# Patient Record
Sex: Female | Born: 1955
Health system: Southern US, Community
[De-identification: ages and names within clinical notes are randomized; demographics above are authoritative.]

## PROBLEM LIST (undated history)

## (undated) DIAGNOSIS — Z9889 Other specified postprocedural states: Secondary | ICD-10-CM

## (undated) DIAGNOSIS — I10 Essential (primary) hypertension: Secondary | ICD-10-CM

## (undated) DIAGNOSIS — J45909 Unspecified asthma, uncomplicated: Secondary | ICD-10-CM

## (undated) DIAGNOSIS — R112 Nausea with vomiting, unspecified: Secondary | ICD-10-CM

## (undated) HISTORY — PX: DG GALL BLADDER: HXRAD326

## (undated) HISTORY — PX: ABDOMINAL HYSTERECTOMY: SHX81

## (undated) HISTORY — PX: FOOT SURGERY: SHX648

## (undated) HISTORY — PX: BREAST EXCISIONAL BIOPSY: SUR124

---

## 2001-09-22 ENCOUNTER — Encounter: Payer: Self-pay | Admitting: *Deleted

## 2001-09-22 ENCOUNTER — Encounter (INDEPENDENT_AMBULATORY_CARE_PROVIDER_SITE_OTHER): Payer: Self-pay | Admitting: Specialist

## 2001-09-22 ENCOUNTER — Encounter: Admission: RE | Admit: 2001-09-22 | Discharge: 2001-09-22 | Payer: Self-pay | Admitting: *Deleted

## 2002-10-02 ENCOUNTER — Encounter: Admission: RE | Admit: 2002-10-02 | Discharge: 2002-10-02 | Payer: Self-pay | Admitting: Family Medicine

## 2002-10-02 ENCOUNTER — Encounter: Payer: Self-pay | Admitting: Family Medicine

## 2004-10-23 ENCOUNTER — Ambulatory Visit (HOSPITAL_COMMUNITY): Admission: RE | Admit: 2004-10-23 | Discharge: 2004-10-23 | Payer: Self-pay | Admitting: Family Medicine

## 2008-05-17 ENCOUNTER — Encounter: Admission: RE | Admit: 2008-05-17 | Discharge: 2008-05-17 | Payer: Self-pay | Admitting: Family Medicine

## 2009-05-16 ENCOUNTER — Encounter: Admission: RE | Admit: 2009-05-16 | Discharge: 2009-05-16 | Payer: Self-pay | Admitting: Family Medicine

## 2009-05-20 ENCOUNTER — Encounter: Admission: RE | Admit: 2009-05-20 | Discharge: 2009-05-20 | Payer: Self-pay | Admitting: Family Medicine

## 2010-09-06 ENCOUNTER — Other Ambulatory Visit (HOSPITAL_COMMUNITY): Payer: Self-pay | Admitting: *Deleted

## 2010-09-06 ENCOUNTER — Other Ambulatory Visit (HOSPITAL_COMMUNITY): Payer: Self-pay | Admitting: Family Medicine

## 2010-09-06 DIAGNOSIS — Z139 Encounter for screening, unspecified: Secondary | ICD-10-CM

## 2010-09-08 ENCOUNTER — Ambulatory Visit (HOSPITAL_COMMUNITY)
Admission: RE | Admit: 2010-09-08 | Discharge: 2010-09-08 | Disposition: A | Discharge status: Home / Self Care | Payer: BC Managed Care – PPO | Source: Ambulatory Visit | Attending: Family Medicine | Admitting: Family Medicine

## 2010-09-08 DIAGNOSIS — Z1231 Encounter for screening mammogram for malignant neoplasm of breast: Secondary | ICD-10-CM | POA: Insufficient documentation

## 2010-09-08 DIAGNOSIS — Z139 Encounter for screening, unspecified: Secondary | ICD-10-CM

## 2011-09-03 ENCOUNTER — Other Ambulatory Visit (HOSPITAL_COMMUNITY): Payer: Self-pay | Admitting: Family Medicine

## 2011-09-03 DIAGNOSIS — Z139 Encounter for screening, unspecified: Secondary | ICD-10-CM

## 2011-09-10 ENCOUNTER — Ambulatory Visit (HOSPITAL_COMMUNITY)
Admission: RE | Admit: 2011-09-10 | Discharge: 2011-09-10 | Disposition: A | Discharge status: Home / Self Care | Payer: BC Managed Care – PPO | Source: Ambulatory Visit | Attending: Family Medicine | Admitting: Family Medicine

## 2011-09-10 DIAGNOSIS — Z139 Encounter for screening, unspecified: Secondary | ICD-10-CM

## 2011-09-10 DIAGNOSIS — Z1231 Encounter for screening mammogram for malignant neoplasm of breast: Secondary | ICD-10-CM | POA: Insufficient documentation

## 2012-08-13 ENCOUNTER — Other Ambulatory Visit: Payer: Self-pay

## 2012-08-13 ENCOUNTER — Other Ambulatory Visit: Payer: Self-pay | Admitting: Family Medicine

## 2012-08-13 DIAGNOSIS — Z1231 Encounter for screening mammogram for malignant neoplasm of breast: Secondary | ICD-10-CM

## 2012-10-06 ENCOUNTER — Ambulatory Visit
Admission: RE | Admit: 2012-10-06 | Discharge: 2012-10-06 | Disposition: A | Discharge status: Home / Self Care | Payer: BC Managed Care – PPO | Source: Ambulatory Visit | Attending: Family Medicine | Admitting: Family Medicine

## 2012-10-06 DIAGNOSIS — Z1231 Encounter for screening mammogram for malignant neoplasm of breast: Secondary | ICD-10-CM

## 2013-09-01 ENCOUNTER — Other Ambulatory Visit: Payer: Self-pay

## 2013-09-01 DIAGNOSIS — Z1231 Encounter for screening mammogram for malignant neoplasm of breast: Secondary | ICD-10-CM

## 2013-09-29 ENCOUNTER — Other Ambulatory Visit (HOSPITAL_COMMUNITY): Payer: Self-pay | Admitting: Family Medicine

## 2013-09-29 DIAGNOSIS — Z1231 Encounter for screening mammogram for malignant neoplasm of breast: Secondary | ICD-10-CM

## 2013-10-07 ENCOUNTER — Ambulatory Visit: Payer: BC Managed Care – PPO

## 2013-10-13 ENCOUNTER — Ambulatory Visit (HOSPITAL_COMMUNITY)
Admission: RE | Admit: 2013-10-13 | Discharge: 2013-10-13 | Disposition: A | Discharge status: Home / Self Care | Payer: BC Managed Care – PPO | Source: Ambulatory Visit | Attending: Family Medicine | Admitting: Family Medicine

## 2013-10-13 DIAGNOSIS — Z1231 Encounter for screening mammogram for malignant neoplasm of breast: Secondary | ICD-10-CM

## 2014-11-29 ENCOUNTER — Other Ambulatory Visit (HOSPITAL_COMMUNITY): Payer: Self-pay | Admitting: Family Medicine

## 2014-11-29 DIAGNOSIS — Z1231 Encounter for screening mammogram for malignant neoplasm of breast: Secondary | ICD-10-CM

## 2014-12-06 ENCOUNTER — Ambulatory Visit (HOSPITAL_COMMUNITY)
Admission: RE | Admit: 2014-12-06 | Discharge: 2014-12-06 | Disposition: A | Discharge status: Home / Self Care | Payer: BLUE CROSS/BLUE SHIELD | Source: Ambulatory Visit | Attending: Family Medicine | Admitting: Family Medicine

## 2014-12-06 DIAGNOSIS — Z1231 Encounter for screening mammogram for malignant neoplasm of breast: Secondary | ICD-10-CM | POA: Diagnosis present

## 2015-12-12 ENCOUNTER — Other Ambulatory Visit (HOSPITAL_COMMUNITY): Payer: Self-pay | Admitting: Family Medicine

## 2015-12-12 DIAGNOSIS — Z1231 Encounter for screening mammogram for malignant neoplasm of breast: Secondary | ICD-10-CM

## 2015-12-19 ENCOUNTER — Ambulatory Visit (HOSPITAL_COMMUNITY)
Admission: RE | Admit: 2015-12-19 | Discharge: 2015-12-19 | Disposition: A | Discharge status: Home / Self Care | Payer: BLUE CROSS/BLUE SHIELD | Source: Ambulatory Visit | Attending: Family Medicine | Admitting: Family Medicine

## 2015-12-19 DIAGNOSIS — Z1231 Encounter for screening mammogram for malignant neoplasm of breast: Secondary | ICD-10-CM | POA: Insufficient documentation

## 2017-01-23 ENCOUNTER — Other Ambulatory Visit (HOSPITAL_COMMUNITY): Payer: Self-pay | Admitting: Family Medicine

## 2017-01-23 DIAGNOSIS — Z1231 Encounter for screening mammogram for malignant neoplasm of breast: Secondary | ICD-10-CM

## 2017-01-24 ENCOUNTER — Ambulatory Visit (HOSPITAL_COMMUNITY)
Admission: RE | Admit: 2017-01-24 | Discharge: 2017-01-24 | Disposition: A | Discharge status: Home / Self Care | Payer: 59 | Source: Ambulatory Visit | Attending: Family Medicine | Admitting: Family Medicine

## 2017-01-24 DIAGNOSIS — Z1231 Encounter for screening mammogram for malignant neoplasm of breast: Secondary | ICD-10-CM | POA: Insufficient documentation

## 2017-02-11 ENCOUNTER — Telehealth: Payer: Self-pay | Admitting: Cardiovascular Disease

## 2017-02-11 NOTE — Telephone Encounter (Signed)
Received records from [facility] for appointment on 03/06/17 with Dr Gwenlyn Found.  Records put with Dr Kennon Holter schedule for 03/06/17. lp

## 2017-02-28 ENCOUNTER — Encounter: Payer: Self-pay | Admitting: Cardiovascular Disease

## 2017-03-06 ENCOUNTER — Ambulatory Visit (INDEPENDENT_AMBULATORY_CARE_PROVIDER_SITE_OTHER): Payer: 59 | Admitting: Cardiovascular Disease

## 2017-03-06 ENCOUNTER — Encounter: Payer: Self-pay | Admitting: Cardiovascular Disease

## 2017-03-06 ENCOUNTER — Encounter (INDEPENDENT_AMBULATORY_CARE_PROVIDER_SITE_OTHER): Payer: Self-pay

## 2017-03-06 VITALS — BP 172/86 | HR 75 | Ht 63.0 in | Wt 158.2 lb

## 2017-03-06 DIAGNOSIS — I200 Unstable angina: Secondary | ICD-10-CM

## 2017-03-06 DIAGNOSIS — R072 Precordial pain: Secondary | ICD-10-CM

## 2017-03-06 DIAGNOSIS — Z8249 Family history of ischemic heart disease and other diseases of the circulatory system: Secondary | ICD-10-CM | POA: Insufficient documentation

## 2017-03-06 DIAGNOSIS — R079 Chest pain, unspecified: Secondary | ICD-10-CM | POA: Insufficient documentation

## 2017-03-06 NOTE — Patient Instructions (Signed)
Medication Instructions:   NO CHANGE  Testing/Procedures:  Your physician has requested that you have en exercise stress myoview. For further information please visit [email or web address]. Please follow instruction sheet, as given.    Follow-Up:  Your physician recommends that you schedule a follow-up appointment WITH DR Gwenlyn Found AFTER TESTING COMPLETE

## 2017-03-06 NOTE — Assessment & Plan Note (Signed)
Amanda Roberts is referred by Dr. Nadara Mustard for evaluation of chest pain. She is a strong family history of heart disease including both parents and 3 brothers but otherwise no cardiac risk factors. She's never had a heart attack or stroke. She's had chest pain for 5 years occurring on a weekly basis lasting for seconds at a time without radiation. There is associated shortness of breath. She's had a negative GXT done about Hospital several years ago. Because of the recent death of her mother for microinfarction she is more concerned. I'm going to get an exercise Myoview stress test to further evaluate.

## 2017-03-06 NOTE — Progress Notes (Signed)
03/06/2017 Amanda Roberts   1955-11-07  [id number]  Primary Physician Rory Percy, MD Primary Cardiologist: Lorretta Harp MD [place] Car[place], [place]  HPI:  Amanda Roberts is a 61 y.o. female married Caucasian female mother of one child, grandmother of 2 twins referred by Dr. Rory Percy for cardiovascular evaluation because of chest pain. Her sister, Amanda Roberts , is also a patient of mine. She basically has no cardiac risk factors other than a strong family history of heart disease with both parents had myocardial infarctions and 3 brothers one of which recently died of a myocardial infarction. She has never had a heart attack or stroke. She's had chest pain off and on for last 5 years occurring weekly, lasting seconds at a time. There is associated shortness of breath but otherwise no nausea vomiting or diaphoresis. There is no radiation. She did have a GXT in [place] several years ago which was unrevealing.   Current Meds  Medication Sig  . albuterol (PROVENTIL HFA;VENTOLIN HFA) 108 (90 Base) MCG/ACT inhaler Inhale 2 puffs into the lungs as directed.  Marland Kitchen aspirin EC 81 MG tablet Take 1 tablet by mouth once a week.  . cyclobenzaprine (FLEXERIL) 10 MG tablet Take 1 tablet by mouth daily as needed for muscle spasms.  . furosemide (LASIX) 40 MG tablet Take 1 tablet by mouth daily as needed.  . meloxicam (MOBIC) 15 MG tablet Take 1 tablet by mouth daily as needed for pain.  . montelukast (SINGULAIR) 10 MG tablet Take 10 mg by mouth as directed.     Allergies  Allergen Reactions  . Levaquin [Levofloxacin In D5w] Rash  . Sulfa Antibiotics Rash    Social History   Social History  . Marital status: Married    Spouse name: Amanda Roberts  . Number of children: Amanda Roberts  . Years of education: Amanda Roberts   Occupational History  . Not on file.   Social History Main Topics  . Smoking status: Never Smoker  . Smokeless tobacco: Never Used  . Alcohol use Not on file  . Drug use: Unknown  .  Sexual activity: Not on file   Other Topics Concern  . Not on file   Social History Narrative  . No narrative on file     Review of Systems: General: negative for chills, fever, night sweats or weight changes.  Cardiovascular: negative for chest pain, dyspnea on exertion, edema, orthopnea, palpitations, paroxysmal nocturnal dyspnea or shortness of breath Dermatological: negative for rash Respiratory: negative for cough or wheezing Urologic: negative for hematuria Abdominal: negative for nausea, vomiting, diarrhea, bright red blood per rectum, melena, or hematemesis Neurologic: negative for visual changes, syncope, or dizziness All other systems reviewed and are otherwise negative except as noted above.    Blood pressure (!) 172/86, pulse 75, height 5\' 3"  (1.6 m), weight 158 lb 3.2 oz (71.8 kg).  General appearance: alert and no distress Neck: no adenopathy, no carotid bruit, no JVD, supple, symmetrical, trachea midline and thyroid not enlarged, symmetric, no tenderness/mass/nodules Lungs: clear to auscultation bilaterally Heart: regular rate and rhythm, S1, S2 normal, no murmur, click, rub or gallop Extremities: extremities normal, atraumatic, no cyanosis or edema Pulses: 2+ and symmetric Skin: Skin color, texture, turgor normal. No rashes or lesions Neurologic: Alert and oriented X 3, normal strength and tone. Normal symmetric reflexes. Normal coordination and gait  EKG sinus rhythm at 75 without ST or T-wave changes. There was sinus arrhythmia noted. I personally reviewed this EKG.  ASSESSMENT AND PLAN:   Chest pain Ms. Amanda Roberts is referred by Dr. Nadara Roberts for evaluation of chest pain. She is a strong family history of heart disease including both parents and 3 brothers but otherwise no cardiac risk factors. She's never had a heart attack or stroke. She's had chest pain for 5 years occurring on a weekly basis lasting for seconds at a time without radiation. There is associated  shortness of breath. She's had a negative GXT done about Hospital several years ago. Because of the recent death of her mother for microinfarction she is more concerned. I'm going to get an exercise Myoview stress test to further evaluate.      Lorretta Harp MD FACP,FACC,FAHA, [facility] 03/06/2017 10:13 AM

## 2017-03-14 ENCOUNTER — Telehealth (HOSPITAL_COMMUNITY): Payer: Self-pay

## 2017-03-14 NOTE — Telephone Encounter (Signed)
Encounter complete. 

## 2017-03-19 ENCOUNTER — Ambulatory Visit (HOSPITAL_COMMUNITY)
Admission: RE | Admit: 2017-03-19 | Discharge: 2017-03-19 | Disposition: A | Discharge status: Home / Self Care | Payer: 59 | Source: Ambulatory Visit | Attending: Cardiovascular Disease | Admitting: Cardiovascular Disease

## 2017-03-19 DIAGNOSIS — R072 Precordial pain: Secondary | ICD-10-CM | POA: Diagnosis not present

## 2017-03-19 LAB — MYOCARDIAL PERFUSION IMAGING
CHL CUP NUCLEAR SDS: 0
CHL CUP NUCLEAR SRS: 1
CHL CUP NUCLEAR SSS: 1
CSEPPHR: 118 {beats}/min
LV dias vol: 66 mL (ref 46–106)
LV sys vol: 21 mL
Rest HR: 88 {beats}/min
TID: 0.96

## 2017-03-19 MED ORDER — TECHNETIUM TC 99M TETROFOSMIN IV KIT
30.1000 | PACK | Freq: Once | INTRAVENOUS | Status: AC | PRN
  Administered 2017-03-19: 30.1 via INTRAVENOUS
  Filled 2017-03-19: qty 31

## 2017-03-19 MED ORDER — REGADENOSON 0.4 MG/5ML IV SOLN
0.4000 mg | Freq: Once | INTRAVENOUS | Status: AC
  Administered 2017-03-19: 0.4 mg via INTRAVENOUS

## 2017-03-19 MED ORDER — TECHNETIUM TC 99M TETROFOSMIN IV KIT
9.9000 | PACK | Freq: Once | INTRAVENOUS | Status: AC | PRN
  Administered 2017-03-19: 9.9 via INTRAVENOUS
  Filled 2017-03-19: qty 10

## 2017-03-19 MED ORDER — AMINOPHYLLINE 25 MG/ML IV SOLN
75.0000 mg | Freq: Once | INTRAVENOUS | Status: AC
  Administered 2017-03-19: 75 mg via INTRAVENOUS

## 2017-03-21 ENCOUNTER — Telehealth: Payer: Self-pay | Admitting: Cardiovascular Disease

## 2017-03-21 NOTE — Telephone Encounter (Signed)
Spoke with pt, aware we have the readings and they will be shown to dr berry tomorrow and we should call her back.

## 2017-03-21 NOTE — Telephone Encounter (Signed)
New message   Pt states she dropped off a list of her BP the other day. She said she has not heard anything. She said she has a meeting at [phone], she will not be available. If she can be called before then.  Pt c/o BP issue: STAT if pt c/o blurred vision, one-sided weakness or slurred speech  1. What are your last 5 BP readings? This morning-170/98  2. Are you having any other symptoms (ex. Dizziness, headache, blurred vision, passed out)? Headache, being hot  3. What is your BP issue? Pt states her BP is running high.

## 2017-03-25 NOTE — Telephone Encounter (Signed)
Dr. Gwenlyn Found reviewed BP readings and said they were a little high. Will call pt to discuss this.

## 2017-04-05 ENCOUNTER — Encounter: Payer: Self-pay | Admitting: Cardiovascular Disease

## 2017-04-05 ENCOUNTER — Ambulatory Visit (INDEPENDENT_AMBULATORY_CARE_PROVIDER_SITE_OTHER): Payer: 59 | Admitting: Cardiovascular Disease

## 2017-04-05 DIAGNOSIS — I208 Other forms of angina pectoris: Secondary | ICD-10-CM

## 2017-04-05 DIAGNOSIS — I10 Essential (primary) hypertension: Secondary | ICD-10-CM

## 2017-04-05 NOTE — Patient Instructions (Signed)

## 2017-04-05 NOTE — Progress Notes (Signed)
Amanda Roberts returns today for follow-up of her Myoview stress test performed 03/19/17 which was entirely normal. This was done in evaluation of chest pain. I have reassured her despite her strong family history. Her PCP in addition added Toprol for hypertension which has made improvement in her symptoms of palpitations at night and her blood pressure readings. I will see her back in 6 months or follow-up.  Amanda Roberts, M.D., [place], [facility], [place] [address]. [place], [place]  [address]  [phone] 04/05/2017 3:02 PM

## 2017-04-05 NOTE — Assessment & Plan Note (Signed)
History of essential hypertension with blood pressure measured at 141/82 improved since the addition of Toprol by her PCP.

## 2017-04-05 NOTE — Assessment & Plan Note (Signed)
Ms. Piechowski had a Myoview stress test done in the evaluation of chest pain on 03/19/17 which was entirely normal.

## 2018-02-25 ENCOUNTER — Other Ambulatory Visit (HOSPITAL_COMMUNITY): Payer: Self-pay | Admitting: Physician Assistant

## 2018-02-25 DIAGNOSIS — Z1231 Encounter for screening mammogram for malignant neoplasm of breast: Secondary | ICD-10-CM

## 2018-03-03 ENCOUNTER — Encounter (HOSPITAL_COMMUNITY): Payer: Self-pay

## 2018-03-03 ENCOUNTER — Ambulatory Visit (HOSPITAL_COMMUNITY)
Admission: RE | Admit: 2018-03-03 | Discharge: 2018-03-03 | Disposition: A | Discharge status: Home / Self Care | Payer: 59 | Source: Ambulatory Visit | Attending: Physician Assistant | Admitting: Physician Assistant

## 2018-03-03 DIAGNOSIS — Z1231 Encounter for screening mammogram for malignant neoplasm of breast: Secondary | ICD-10-CM | POA: Insufficient documentation

## 2018-12-01 DIAGNOSIS — Z6829 Body mass index (BMI) 29.0-29.9, adult: Secondary | ICD-10-CM | POA: Diagnosis not present

## 2018-12-01 DIAGNOSIS — L209 Atopic dermatitis, unspecified: Secondary | ICD-10-CM | POA: Diagnosis not present

## 2018-12-12 DIAGNOSIS — Z0000 Encounter for general adult medical examination without abnormal findings: Secondary | ICD-10-CM | POA: Diagnosis not present

## 2018-12-16 DIAGNOSIS — Z23 Encounter for immunization: Secondary | ICD-10-CM | POA: Diagnosis not present

## 2018-12-16 DIAGNOSIS — Z0000 Encounter for general adult medical examination without abnormal findings: Secondary | ICD-10-CM | POA: Diagnosis not present

## 2018-12-25 DIAGNOSIS — R0600 Dyspnea, unspecified: Secondary | ICD-10-CM | POA: Diagnosis not present

## 2018-12-25 DIAGNOSIS — I10 Essential (primary) hypertension: Secondary | ICD-10-CM | POA: Diagnosis not present

## 2019-01-08 DIAGNOSIS — Z6829 Body mass index (BMI) 29.0-29.9, adult: Secondary | ICD-10-CM | POA: Diagnosis not present

## 2019-01-08 DIAGNOSIS — I10 Essential (primary) hypertension: Secondary | ICD-10-CM | POA: Diagnosis not present

## 2019-01-08 DIAGNOSIS — J4520 Mild intermittent asthma, uncomplicated: Secondary | ICD-10-CM | POA: Diagnosis not present

## 2019-02-25 DIAGNOSIS — Z23 Encounter for immunization: Secondary | ICD-10-CM | POA: Diagnosis not present

## 2019-06-27 DIAGNOSIS — L259 Unspecified contact dermatitis, unspecified cause: Secondary | ICD-10-CM | POA: Diagnosis not present

## 2019-07-14 ENCOUNTER — Other Ambulatory Visit (HOSPITAL_COMMUNITY): Payer: Self-pay | Admitting: Physician Assistant

## 2019-07-14 DIAGNOSIS — Z1231 Encounter for screening mammogram for malignant neoplasm of breast: Secondary | ICD-10-CM

## 2019-07-16 ENCOUNTER — Ambulatory Visit (HOSPITAL_COMMUNITY): Payer: BC Managed Care – PPO

## 2019-08-14 ENCOUNTER — Other Ambulatory Visit: Payer: Self-pay

## 2019-08-14 ENCOUNTER — Ambulatory Visit (HOSPITAL_COMMUNITY)
Admission: RE | Admit: 2019-08-14 | Discharge: 2019-08-14 | Disposition: A | Discharge status: Home / Self Care | Payer: BC Managed Care – PPO | Source: Ambulatory Visit | Attending: Physician Assistant | Admitting: Physician Assistant

## 2019-08-14 DIAGNOSIS — Z1231 Encounter for screening mammogram for malignant neoplasm of breast: Secondary | ICD-10-CM | POA: Insufficient documentation

## 2019-08-17 ENCOUNTER — Other Ambulatory Visit (HOSPITAL_COMMUNITY): Payer: Self-pay | Admitting: Physician Assistant

## 2019-08-17 DIAGNOSIS — R928 Other abnormal and inconclusive findings on diagnostic imaging of breast: Secondary | ICD-10-CM

## 2019-08-25 ENCOUNTER — Other Ambulatory Visit (HOSPITAL_COMMUNITY): Payer: Self-pay | Admitting: Physician Assistant

## 2019-08-25 ENCOUNTER — Other Ambulatory Visit: Payer: Self-pay

## 2019-08-25 ENCOUNTER — Ambulatory Visit (HOSPITAL_COMMUNITY)
Admission: RE | Admit: 2019-08-25 | Discharge: 2019-08-25 | Disposition: A | Discharge status: Home / Self Care | Payer: BC Managed Care – PPO | Source: Ambulatory Visit | Attending: Physician Assistant | Admitting: Physician Assistant

## 2019-08-25 DIAGNOSIS — N6323 Unspecified lump in the left breast, lower outer quadrant: Secondary | ICD-10-CM | POA: Diagnosis not present

## 2019-08-25 DIAGNOSIS — R928 Other abnormal and inconclusive findings on diagnostic imaging of breast: Secondary | ICD-10-CM

## 2019-08-25 DIAGNOSIS — N6324 Unspecified lump in the left breast, lower inner quadrant: Secondary | ICD-10-CM | POA: Diagnosis not present

## 2019-09-01 ENCOUNTER — Ambulatory Visit (HOSPITAL_COMMUNITY)
Admission: RE | Admit: 2019-09-01 | Discharge: 2019-09-01 | Disposition: A | Discharge status: Home / Self Care | Payer: BC Managed Care – PPO | Source: Ambulatory Visit | Attending: Physician Assistant | Admitting: Physician Assistant

## 2019-09-01 ENCOUNTER — Other Ambulatory Visit: Payer: Self-pay

## 2019-09-01 ENCOUNTER — Other Ambulatory Visit (HOSPITAL_COMMUNITY): Payer: Self-pay | Admitting: Physician Assistant

## 2019-09-01 DIAGNOSIS — N6012 Diffuse cystic mastopathy of left breast: Secondary | ICD-10-CM | POA: Diagnosis not present

## 2019-09-01 DIAGNOSIS — R928 Other abnormal and inconclusive findings on diagnostic imaging of breast: Secondary | ICD-10-CM

## 2019-09-01 DIAGNOSIS — N6325 Unspecified lump in the left breast, overlapping quadrants: Secondary | ICD-10-CM | POA: Diagnosis not present

## 2019-09-01 DIAGNOSIS — N6320 Unspecified lump in the left breast, unspecified quadrant: Secondary | ICD-10-CM | POA: Diagnosis not present

## 2019-09-01 MED ORDER — LIDOCAINE HCL (PF) 2 % IJ SOLN
INTRAMUSCULAR | Status: AC
  Filled 2019-09-01: qty 10

## 2019-09-02 ENCOUNTER — Other Ambulatory Visit: Payer: Self-pay | Admitting: Physician Assistant

## 2019-09-02 DIAGNOSIS — N6320 Unspecified lump in the left breast, unspecified quadrant: Secondary | ICD-10-CM

## 2019-09-02 LAB — SURGICAL PATHOLOGY

## 2019-09-08 ENCOUNTER — Other Ambulatory Visit: Payer: Self-pay

## 2019-09-08 ENCOUNTER — Ambulatory Visit
Admission: RE | Admit: 2019-09-08 | Discharge: 2019-09-08 | Disposition: A | Discharge status: Home / Self Care | Payer: BC Managed Care – PPO | Source: Ambulatory Visit | Attending: Physician Assistant | Admitting: Physician Assistant

## 2019-09-08 DIAGNOSIS — N6323 Unspecified lump in the left breast, lower outer quadrant: Secondary | ICD-10-CM | POA: Diagnosis not present

## 2019-09-08 DIAGNOSIS — N6320 Unspecified lump in the left breast, unspecified quadrant: Secondary | ICD-10-CM | POA: Diagnosis not present

## 2019-09-08 DIAGNOSIS — D242 Benign neoplasm of left breast: Secondary | ICD-10-CM | POA: Diagnosis not present

## 2019-10-01 ENCOUNTER — Ambulatory Visit: Payer: Self-pay | Admitting: General Surgery

## 2019-10-01 DIAGNOSIS — D242 Benign neoplasm of left breast: Secondary | ICD-10-CM | POA: Diagnosis not present

## 2019-10-14 ENCOUNTER — Other Ambulatory Visit: Payer: Self-pay | Admitting: General Surgery

## 2019-10-14 DIAGNOSIS — D242 Benign neoplasm of left breast: Secondary | ICD-10-CM

## 2019-10-15 ENCOUNTER — Encounter (HOSPITAL_BASED_OUTPATIENT_CLINIC_OR_DEPARTMENT_OTHER): Payer: Self-pay | Admitting: General Surgery

## 2019-10-15 ENCOUNTER — Other Ambulatory Visit: Payer: Self-pay

## 2019-10-20 ENCOUNTER — Encounter (HOSPITAL_BASED_OUTPATIENT_CLINIC_OR_DEPARTMENT_OTHER)
Admission: RE | Admit: 2019-10-20 | Discharge: 2019-10-20 | Disposition: A | Discharge status: Home / Self Care | Payer: BC Managed Care – PPO | Source: Ambulatory Visit | Attending: General Surgery | Admitting: General Surgery

## 2019-10-20 ENCOUNTER — Other Ambulatory Visit (HOSPITAL_COMMUNITY)
Admission: RE | Admit: 2019-10-20 | Discharge: 2019-10-20 | Disposition: A | Discharge status: Home / Self Care | Payer: BC Managed Care – PPO | Source: Ambulatory Visit | Attending: General Surgery | Admitting: General Surgery

## 2019-10-20 DIAGNOSIS — Z20822 Contact with and (suspected) exposure to covid-19: Secondary | ICD-10-CM | POA: Insufficient documentation

## 2019-10-20 DIAGNOSIS — Z01818 Encounter for other preprocedural examination: Secondary | ICD-10-CM | POA: Insufficient documentation

## 2019-10-20 LAB — BASIC METABOLIC PANEL
Anion gap: 8 (ref 5–15)
BUN: 12 mg/dL (ref 8–23)
CO2: 27 mmol/L (ref 22–32)
Calcium: 9.2 mg/dL (ref 8.9–10.3)
Chloride: 105 mmol/L (ref 98–111)
Creatinine, Ser: 0.76 mg/dL (ref 0.44–1.00)
GFR calc Af Amer: >60 mL/min (ref >60)
GFR calc non Af Amer: >60 mL/min (ref >60)
Glucose, Bld: 102 mg/dL — ABNORMAL HIGH (ref 70–99)
Potassium: 4.2 mmol/L (ref 3.5–5.1)
Sodium: 140 mmol/L (ref 135–145)

## 2019-10-20 LAB — SARS CORONAVIRUS 2 (TAT 6-24 HRS): SARS Coronavirus 2: NEGATIVE

## 2019-10-20 NOTE — Progress Notes (Signed)

## 2019-10-22 ENCOUNTER — Other Ambulatory Visit: Payer: Self-pay

## 2019-10-22 ENCOUNTER — Ambulatory Visit
Admission: RE | Admit: 2019-10-22 | Discharge: 2019-10-22 | Disposition: A | Discharge status: Home / Self Care | Payer: BC Managed Care – PPO | Source: Ambulatory Visit | Attending: General Surgery | Admitting: General Surgery

## 2019-10-22 DIAGNOSIS — D242 Benign neoplasm of left breast: Secondary | ICD-10-CM

## 2019-10-22 DIAGNOSIS — R928 Other abnormal and inconclusive findings on diagnostic imaging of breast: Secondary | ICD-10-CM | POA: Diagnosis not present

## 2019-10-23 ENCOUNTER — Encounter (HOSPITAL_BASED_OUTPATIENT_CLINIC_OR_DEPARTMENT_OTHER)
Admission: RE | Disposition: A | Discharge status: Home / Self Care | Payer: Self-pay | Source: Home / Self Care | Attending: General Surgery

## 2019-10-23 ENCOUNTER — Ambulatory Visit (HOSPITAL_BASED_OUTPATIENT_CLINIC_OR_DEPARTMENT_OTHER)
Admission: RE | Admit: 2019-10-23 | Discharge: 2019-10-23 | Disposition: A | Discharge status: Home / Self Care | Payer: BC Managed Care – PPO | Attending: General Surgery | Admitting: General Surgery

## 2019-10-23 ENCOUNTER — Ambulatory Visit (HOSPITAL_BASED_OUTPATIENT_CLINIC_OR_DEPARTMENT_OTHER): Payer: BC Managed Care – PPO | Admitting: Certified Registered Nurse Anesthetist

## 2019-10-23 ENCOUNTER — Ambulatory Visit
Admission: RE | Admit: 2019-10-23 | Discharge: 2019-10-23 | Disposition: A | Discharge status: Home / Self Care | Payer: BC Managed Care – PPO | Source: Ambulatory Visit | Attending: General Surgery | Admitting: General Surgery

## 2019-10-23 ENCOUNTER — Other Ambulatory Visit: Payer: Self-pay

## 2019-10-23 ENCOUNTER — Encounter (HOSPITAL_BASED_OUTPATIENT_CLINIC_OR_DEPARTMENT_OTHER): Payer: Self-pay | Admitting: General Surgery

## 2019-10-23 ENCOUNTER — Other Ambulatory Visit: Payer: Self-pay | Admitting: Anatomic Pathology & Clinical Pathology

## 2019-10-23 DIAGNOSIS — D242 Benign neoplasm of left breast: Secondary | ICD-10-CM

## 2019-10-23 DIAGNOSIS — I10 Essential (primary) hypertension: Secondary | ICD-10-CM | POA: Diagnosis not present

## 2019-10-23 DIAGNOSIS — Z791 Long term (current) use of non-steroidal anti-inflammatories (NSAID): Secondary | ICD-10-CM | POA: Diagnosis not present

## 2019-10-23 DIAGNOSIS — J45909 Unspecified asthma, uncomplicated: Secondary | ICD-10-CM | POA: Diagnosis not present

## 2019-10-23 DIAGNOSIS — Z79899 Other long term (current) drug therapy: Secondary | ICD-10-CM | POA: Diagnosis not present

## 2019-10-23 DIAGNOSIS — M1990 Unspecified osteoarthritis, unspecified site: Secondary | ICD-10-CM | POA: Insufficient documentation

## 2019-10-23 DIAGNOSIS — R928 Other abnormal and inconclusive findings on diagnostic imaging of breast: Secondary | ICD-10-CM | POA: Diagnosis not present

## 2019-10-23 DIAGNOSIS — N6092 Unspecified benign mammary dysplasia of left breast: Secondary | ICD-10-CM | POA: Diagnosis not present

## 2019-10-23 HISTORY — DX: Other specified postprocedural states: Z98.890

## 2019-10-23 HISTORY — PX: BREAST LUMPECTOMY WITH RADIOACTIVE SEED LOCALIZATION: SHX6424

## 2019-10-23 HISTORY — DX: Nausea with vomiting, unspecified: R11.2

## 2019-10-23 HISTORY — DX: Essential (primary) hypertension: I10

## 2019-10-23 HISTORY — DX: Unspecified asthma, uncomplicated: J45.909

## 2019-10-23 SURGERY — BREAST LUMPECTOMY WITH RADIOACTIVE SEED LOCALIZATION
Anesthesia: General | Site: Breast | Laterality: Left

## 2019-10-23 MED ORDER — GABAPENTIN 300 MG PO CAPS
ORAL_CAPSULE | ORAL | Status: AC
  Filled 2019-10-23: qty 1

## 2019-10-23 MED ORDER — PROMETHAZINE HCL 25 MG/ML IJ SOLN
6.2500 mg | INTRAMUSCULAR | Status: DC | PRN
  Administered 2019-10-23: 6.25 mg via INTRAVENOUS

## 2019-10-23 MED ORDER — ACETAMINOPHEN 500 MG PO TABS
ORAL_TABLET | ORAL | Status: AC
  Filled 2019-10-23: qty 2

## 2019-10-23 MED ORDER — CHLORHEXIDINE GLUCONATE CLOTH 2 % EX PADS: 6.0000 | MEDICATED_PAD | Freq: Once | CUTANEOUS | Status: DC

## 2019-10-23 MED ORDER — DEXAMETHASONE SODIUM PHOSPHATE 10 MG/ML IJ SOLN
INTRAMUSCULAR | Status: DC | PRN
  Administered 2019-10-23: 10 mg via INTRAVENOUS

## 2019-10-23 MED ORDER — EPHEDRINE SULFATE 50 MG/ML IJ SOLN
INTRAMUSCULAR | Status: DC | PRN
  Administered 2019-10-23 (×5): 10 mg via INTRAVENOUS

## 2019-10-23 MED ORDER — LIDOCAINE 2% (20 MG/ML) 5 ML SYRINGE
INTRAMUSCULAR | Status: DC | PRN
  Administered 2019-10-23: 100 mg via INTRAVENOUS

## 2019-10-23 MED ORDER — BUPIVACAINE HCL (PF) 0.25 % IJ SOLN
INTRAMUSCULAR | Status: AC
  Filled 2019-10-23: qty 30

## 2019-10-23 MED ORDER — MIDAZOLAM HCL 2 MG/2ML IJ SOLN
INTRAMUSCULAR | Status: AC
  Filled 2019-10-23: qty 2

## 2019-10-23 MED ORDER — MIDAZOLAM HCL 2 MG/2ML IJ SOLN: 1.0000 mg | INTRAMUSCULAR | Status: DC | PRN

## 2019-10-23 MED ORDER — PHENYLEPHRINE 40 MCG/ML (10ML) SYRINGE FOR IV PUSH (FOR BLOOD PRESSURE SUPPORT)
PREFILLED_SYRINGE | INTRAVENOUS | Status: AC
  Filled 2019-10-23: qty 10

## 2019-10-23 MED ORDER — OXYCODONE HCL 5 MG PO TABS
ORAL_TABLET | ORAL | Status: AC
  Filled 2019-10-23: qty 1

## 2019-10-23 MED ORDER — EPHEDRINE 5 MG/ML INJ
INTRAVENOUS | Status: AC
  Filled 2019-10-23: qty 10

## 2019-10-23 MED ORDER — FENTANYL CITRATE (PF) 100 MCG/2ML IJ SOLN
INTRAMUSCULAR | Status: AC
  Filled 2019-10-23: qty 2

## 2019-10-23 MED ORDER — PROMETHAZINE HCL 25 MG/ML IJ SOLN
INTRAMUSCULAR | Status: AC
  Filled 2019-10-23: qty 1

## 2019-10-23 MED ORDER — BUPIVACAINE HCL (PF) 0.25 % IJ SOLN
INTRAMUSCULAR | Status: DC | PRN
  Administered 2019-10-23: 20 mL

## 2019-10-23 MED ORDER — ONDANSETRON HCL 4 MG/2ML IJ SOLN
INTRAMUSCULAR | Status: AC
  Filled 2019-10-23: qty 2

## 2019-10-23 MED ORDER — PROPOFOL 10 MG/ML IV BOLUS
INTRAVENOUS | Status: DC | PRN
  Administered 2019-10-23: 150 mg via INTRAVENOUS

## 2019-10-23 MED ORDER — LIDOCAINE 2% (20 MG/ML) 5 ML SYRINGE
INTRAMUSCULAR | Status: AC
  Filled 2019-10-23: qty 5

## 2019-10-23 MED ORDER — CEFAZOLIN SODIUM-DEXTROSE 2-4 GM/100ML-% IV SOLN
2.0000 g | INTRAVENOUS | Status: AC
  Administered 2019-10-23: 2 g via INTRAVENOUS

## 2019-10-23 MED ORDER — MIDAZOLAM HCL 5 MG/5ML IJ SOLN
INTRAMUSCULAR | Status: DC | PRN
  Administered 2019-10-23: 2 mg via INTRAVENOUS

## 2019-10-23 MED ORDER — GABAPENTIN 300 MG PO CAPS
300.0000 mg | ORAL_CAPSULE | ORAL | Status: AC
  Administered 2019-10-23: 11:00:00 300 mg via ORAL

## 2019-10-23 MED ORDER — CEFAZOLIN SODIUM-DEXTROSE 2-4 GM/100ML-% IV SOLN
INTRAVENOUS | Status: AC
  Filled 2019-10-23: qty 100

## 2019-10-23 MED ORDER — FENTANYL CITRATE (PF) 100 MCG/2ML IJ SOLN
INTRAMUSCULAR | Status: DC | PRN
  Administered 2019-10-23: 25 ug via INTRAVENOUS
  Administered 2019-10-23: 50 ug via INTRAVENOUS
  Administered 2019-10-23: 25 ug via INTRAVENOUS

## 2019-10-23 MED ORDER — FENTANYL CITRATE (PF) 100 MCG/2ML IJ SOLN
25.0000 ug | INTRAMUSCULAR | Status: DC | PRN
  Administered 2019-10-23 (×2): 50 ug via INTRAVENOUS

## 2019-10-23 MED ORDER — SCOPOLAMINE 1 MG/3DAYS TD PT72
MEDICATED_PATCH | TRANSDERMAL | Status: DC | PRN
  Administered 2019-10-23: 1 via TRANSDERMAL

## 2019-10-23 MED ORDER — LACTATED RINGERS IV SOLN: INTRAVENOUS | Status: DC

## 2019-10-23 MED ORDER — ONDANSETRON HCL 4 MG/2ML IJ SOLN
INTRAMUSCULAR | Status: DC | PRN
  Administered 2019-10-23: 4 mg via INTRAVENOUS

## 2019-10-23 MED ORDER — OXYCODONE HCL 5 MG PO TABS
5.0000 mg | ORAL_TABLET | Freq: Once | ORAL | Status: AC | PRN
  Administered 2019-10-23: 14:00:00 5 mg via ORAL

## 2019-10-23 MED ORDER — DEXAMETHASONE SODIUM PHOSPHATE 10 MG/ML IJ SOLN
INTRAMUSCULAR | Status: AC
  Filled 2019-10-23: qty 1

## 2019-10-23 MED ORDER — PROPOFOL 10 MG/ML IV BOLUS
INTRAVENOUS | Status: AC
  Filled 2019-10-23: qty 20

## 2019-10-23 MED ORDER — 0.9 % SODIUM CHLORIDE (POUR BTL) OPTIME
TOPICAL | Status: DC | PRN
  Administered 2019-10-23: 12:00:00 200 mL

## 2019-10-23 MED ORDER — ACETAMINOPHEN 500 MG PO TABS
1000.0000 mg | ORAL_TABLET | ORAL | Status: AC
  Administered 2019-10-23: 11:00:00 1000 mg via ORAL

## 2019-10-23 MED ORDER — OXYCODONE HCL 5 MG/5ML PO SOLN: 5.0000 mg | Freq: Once | ORAL | Status: AC | PRN

## 2019-10-23 MED ORDER — HYDROCODONE-ACETAMINOPHEN 5-325 MG PO TABS: 1.0000 | ORAL_TABLET | Freq: Four times a day (QID) | ORAL | 0 refills | Status: DC | PRN

## 2019-10-23 MED ORDER — FENTANYL CITRATE (PF) 100 MCG/2ML IJ SOLN: 50.0000 ug | INTRAMUSCULAR | Status: DC | PRN

## 2019-10-23 SURGICAL SUPPLY — 40 items
APPLIER CLIP 9.375 MED OPEN (MISCELLANEOUS)
BLADE SURG 15 STRL LF DISP TIS (BLADE) ×1 IMPLANT
BLADE SURG 15 STRL SS (BLADE) ×2
CANISTER SUC SOCK COL 7IN (MISCELLANEOUS) ×1 IMPLANT
CANISTER SUCT 1200ML W/VALVE (MISCELLANEOUS) ×1 IMPLANT
CHLORAPREP W/TINT 26 (MISCELLANEOUS) ×2 IMPLANT
CLIP APPLIE 9.375 MED OPEN (MISCELLANEOUS) IMPLANT
COVER BACK TABLE 60X90IN (DRAPES) ×2 IMPLANT
COVER MAYO STAND STRL (DRAPES) ×2 IMPLANT
COVER PROBE W GEL 5X96 (DRAPES) ×2 IMPLANT
COVER WAND RF STERILE (DRAPES) IMPLANT
DECANTER SPIKE VIAL GLASS SM (MISCELLANEOUS) IMPLANT
DERMABOND ADVANCED (GAUZE/BANDAGES/DRESSINGS) ×1
DERMABOND ADVANCED .7 DNX12 (GAUZE/BANDAGES/DRESSINGS) ×1 IMPLANT
DRAPE LAPAROSCOPIC ABDOMINAL (DRAPES) ×2 IMPLANT
DRAPE UTILITY XL STRL (DRAPES) ×2 IMPLANT
ELECT COATED BLADE 2.86 ST (ELECTRODE) ×2 IMPLANT
ELECT REM PT RETURN 9FT ADLT (ELECTROSURGICAL) ×2
ELECTRODE REM PT RTRN 9FT ADLT (ELECTROSURGICAL) ×1 IMPLANT
GLOVE BIO SURGEON STRL SZ7.5 (GLOVE) ×4 IMPLANT
GOWN STRL REUS W/ TWL LRG LVL3 (GOWN DISPOSABLE) ×2 IMPLANT
GOWN STRL REUS W/TWL LRG LVL3 (GOWN DISPOSABLE) ×4
ILLUMINATOR WAVEGUIDE N/F (MISCELLANEOUS) IMPLANT
KIT MARKER MARGIN INK (KITS) ×2 IMPLANT
LIGHT WAVEGUIDE WIDE FLAT (MISCELLANEOUS) IMPLANT
NDL HYPO 25X1 1.5 SAFETY (NEEDLE) IMPLANT
NEEDLE HYPO 25X1 1.5 SAFETY (NEEDLE) ×2 IMPLANT
NS IRRIG 1000ML POUR BTL (IV SOLUTION) ×1 IMPLANT
PENCIL SMOKE EVACUATOR (MISCELLANEOUS) ×2 IMPLANT
SET BASIN DAY SURGERY F.S. (CUSTOM PROCEDURE TRAY) ×2 IMPLANT
SLEEVE SCD COMPRESS KNEE MED (MISCELLANEOUS) ×2 IMPLANT
SPONGE LAP 18X18 RF (DISPOSABLE) ×2 IMPLANT
SUT MON AB 4-0 PC3 18 (SUTURE) ×2 IMPLANT
SUT SILK 2 0 SH (SUTURE) IMPLANT
SUT VICRYL 3-0 CR8 SH (SUTURE) ×2 IMPLANT
SYR CONTROL 10ML LL (SYRINGE) ×1 IMPLANT
TOWEL GREEN STERILE FF (TOWEL DISPOSABLE) ×2 IMPLANT
TRAY FAXITRON CT DISP (TRAY / TRAY PROCEDURE) ×2 IMPLANT
TUBE CONNECTING 20X1/4 (TUBING) ×2 IMPLANT
YANKAUER SUCT BULB TIP NO VENT (SUCTIONS) ×1 IMPLANT

## 2019-10-23 NOTE — H&P (Signed)
Amanda Roberts  Location: [facility] Patient #: [id number] DOB: 1956/06/14 Married / Language: English / Race: White Female   History of Present Illness The patient is a 64 year old female who presents with a breast mass. We are asked to see the patient in consultation by Dr. Aggie Hacker to evaluate her for a intraductal papilloma of the left breast. The patient is a 64 year old white female who recently went for a routine screening mammogram. At that time she was found to have a mass in the lower central left breast which measured 9 mm. This was biopsied and came back as an intraductal papilloma. She denies any breast pain or discharge from the nipple. She does not smoke. She does have a family history of breast cancer in her grandmother   Past Surgical History Breast Biopsy  Left. Cesarean Section - 1  Foot Surgery  Left. Gallbladder Surgery - Laparoscopic  Hysterectomy (due to cancer) - Complete  Hysterectomy (not due to cancer) - Complete  Oral Surgery   Diagnostic Studies History  Colonoscopy  5-10 years ago Mammogram  >3 years ago  Allergies  Septra *ANTI-INFECTIVE AGENTS - MISC.*  Levaquin *FLUOROQUINOLONES*  Allergies Reconciled   Medication History  Albuterol Sulfate ((2.5 MG/3ML)0.083% Nebulized Soln, Inhalation) Active. hydroCHLOROthiazide (25MG  Tablet, Oral) Active. Meloxicam (15MG  Tablet, Oral) Active. Singulair (10MG  Tablet, Oral) Active. Toprol XL (50MG  Tablet ER 24HR, Oral) Active. Medications Reconciled  Social History Caffeine use  Coffee. No alcohol use  No drug use  Tobacco use  Never smoker.  Family History  Arthritis  Mother. Breast Cancer  Family Members In General. Diabetes Mellitus  Father, Mother. Heart Disease  Brother, Father, Mother. Heart disease in female family member before age 40  Heart disease in female family member before age 40  Hypertension  Father, Mother. Respiratory Condition   Father, Mother.  Pregnancy / Birth History  Age at menarche  24 years. Age of menopause  <47 Gravida  1 Maternal age  31-25 Para  71  Other Problems Arthritis  Asthma  High blood pressure  Oophorectomy     Review of Systems HEENT Present- Seasonal Allergies and Wears glasses/contact lenses. Not Present- Earache, Hearing Loss, Hoarseness, Nose Bleed, Oral Ulcers, Ringing in the Ears, Sinus Pain, Sore Throat, Visual Disturbances and Yellow Eyes. Cardiovascular Not Present- Chest Pain, Difficulty Breathing Lying Down, Leg Cramps, Palpitations, Rapid Heart Rate, Shortness of Breath and Swelling of Extremities. Gastrointestinal Not Present- Abdominal Pain, Bloating, Bloody Stool, Change in Bowel Habits, Chronic diarrhea, Constipation, Difficulty Swallowing, Excessive gas, Gets full quickly at meals, Hemorrhoids, Indigestion, Nausea, Rectal Pain and Vomiting. Female Genitourinary Not Present- Frequency, Nocturia, Painful Urination, Pelvic Pain and Urgency. Musculoskeletal Not Present- Back Pain, Joint Pain, Joint Stiffness, Muscle Pain, Muscle Weakness and Swelling of Extremities. Neurological Not Present- Decreased Memory, Fainting, Headaches, Numbness, Seizures, Tingling, Tremor, Trouble walking and Weakness. Psychiatric Not Present- Anxiety, Bipolar, Change in Sleep Pattern, Depression, Fearful and Frequent crying. Endocrine Not Present- Cold Intolerance, Excessive Hunger, Hair Changes, Heat Intolerance, Hot flashes and New Diabetes. Hematology Not Present- Blood Thinners, Easy Bruising, Excessive bleeding, Gland problems, HIV and Persistent Infections.  Vitals  Weight: 169.8 lb Height: 63in Body Surface Area: 1.8 m Body Mass Index: 30.08 kg/m  Temp.: 97.51F  Pulse: 78 (Regular)  BP: 136/88(Sitting, Left Arm, Standard)       Physical Exam  General Mental Status-Alert. General Appearance-Consistent with stated age. Hydration-Well  hydrated. Voice-Normal.  Head and Neck Head-normocephalic, atraumatic with no lesions or  palpable masses. Trachea-midline. Thyroid Gland Characteristics - normal size and consistency.  Eye Eyeball - Bilateral-Extraocular movements intact. Sclera/Conjunctiva - Bilateral-No scleral icterus.  Chest and Lung Exam Chest and lung exam reveals -quiet, even and easy respiratory effort with no use of accessory muscles and on auscultation, normal breath sounds, no adventitious sounds and normal vocal resonance. Inspection Chest Wall - Normal. Back - normal.  Breast Note: There is no palpable mass in either breast. There is no palpable axillary, supraclavicular, or cervical lymphadenopathy.   Cardiovascular Cardiovascular examination reveals -normal heart sounds, regular rate and rhythm with no murmurs and normal pedal pulses bilaterally.  Abdomen Inspection Inspection of the abdomen reveals - No Hernias. Skin - Scar - no surgical scars. Palpation/Percussion Palpation and Percussion of the abdomen reveal - Soft, Non Tender, No Rebound tenderness, No Rigidity (guarding) and No hepatosplenomegaly. Auscultation Auscultation of the abdomen reveals - Bowel sounds normal.  Neurologic Neurologic evaluation reveals -alert and oriented x 3 with no impairment of recent or remote memory. Mental Status-Normal.  Musculoskeletal Normal Exam - Left-Upper Extremity Strength Normal and Lower Extremity Strength Normal. Normal Exam - Right-Upper Extremity Strength Normal and Lower Extremity Strength Normal.  Lymphatic Head & Neck  General Head & Neck Lymphatics: Bilateral - Description - Normal. Axillary  General Axillary Region: Bilateral - Description - Normal. Tenderness - Non Tender. Femoral & Inguinal  Generalized Femoral & Inguinal Lymphatics: Bilateral - Description - Normal. Tenderness - Non Tender.    Assessment & Plan INTRADUCTAL PAPILLOMA OF BREAST, LEFT  (D24.2) Impression: The patient appears to have a 9 mm intraductal papilloma in the central portion of the left breast. Because of its abnormal appearance and because there is a 5-10% chance of missing something more significant the recommendation is to have this area removed. She would also like to have this done. I have discussed with her in detail the risks and benefits of the operation as well as some of the technical aspects including the use of a radioactive seed for localization and she understands and wishes to proceed. This patient encounter took 45 minutes today to perform the following: take history, perform exam, review outside records, interpret imaging, counsel the patient on their diagnosis and document encounter, findings & plan in the EHR Current Plans Pt Education - Breast Diseases: discussed with patient and provided information.

## 2019-10-23 NOTE — Anesthesia Preprocedure Evaluation (Signed)
Anesthesia Evaluation  Patient identified by MRN, date of birth, ID band Patient awake    Reviewed: Allergy & Precautions, NPO status , Patient's Chart, lab work & pertinent test results, reviewed documented beta blocker date and time   History of Anesthesia Complications (+) PONVNegative for: history of anesthetic complications  Airway Mallampati: II  TM Distance: >3 FB Neck ROM: Full    Dental  (+) Teeth Intact   Pulmonary asthma ,    Pulmonary exam normal        Cardiovascular hypertension, Pt. on medications and Pt. on home beta blockers Normal cardiovascular exam     Neuro/Psych negative neurological ROS  negative psych ROS   GI/Hepatic negative GI ROS, Neg liver ROS,   Endo/Other  negative endocrine ROS  Renal/GU negative Renal ROS  negative genitourinary   Musculoskeletal negative musculoskeletal ROS (+)   Abdominal   Peds  Hematology negative hematology ROS (+)   Anesthesia Other Findings   Reproductive/Obstetrics                            Anesthesia Physical Anesthesia Plan  ASA: II  Anesthesia Plan: General   Post-op Pain Management:    Induction: Intravenous  PONV Risk Score and Plan: 4 or greater and Ondansetron, Dexamethasone, Midazolam, Treatment may vary due to age or medical condition and Scopolamine patch - Pre-op  Airway Management Planned: LMA  Additional Equipment: None  Intra-op Plan:   Post-operative Plan: Extubation in OR  Informed Consent: I have reviewed the patients History and Physical, chart, labs and discussed the procedure including the risks, benefits and alternatives for the proposed anesthesia with the patient or authorized representative who has indicated his/her understanding and acceptance.     Dental advisory given  Plan Discussed with:   Anesthesia Plan Comments:         Anesthesia Quick Evaluation

## 2019-10-23 NOTE — Discharge Instructions (Signed)
  Post Anesthesia Home Care Instructions  Activity: Get plenty of rest for the remainder of the day. A responsible individual must stay with you for 24 hours following the procedure.  For the next 24 hours, DO NOT: -Drive a car -Paediatric nurse -Drink alcoholic beverages -Take any medication unless instructed by your physician -Make any legal decisions or sign important papers.  Meals: Start with liquid foods such as gelatin or soup. Progress to regular foods as tolerated. Avoid greasy, spicy, heavy foods. If nausea and/or vomiting occur, drink only clear liquids until the nausea and/or vomiting subsides. Call your physician if vomiting continues.  Special Instructions/Symptoms: Your throat may feel dry or sore from the anesthesia or the breathing tube placed in your throat during surgery. If this causes discomfort, gargle with warm salt water. The discomfort should disappear within 24 hours.  If you had a scopolamine patch placed behind your ear for the management of post- operative nausea and/or vomiting:  1. The medication in the patch is effective for 72 hours, after which it should be removed.  Wrap patch in a tissue and discard in the trash. Wash hands thoroughly with soap and water. 2. You may remove the patch earlier than 72 hours if you experience unpleasant side effects which may include dry mouth, dizziness or visual disturbances. 3. Avoid touching the patch. Wash your hands with soap and water after contact with the patch.  Call your surgeon if you experience:   1.  Fever over 101.0. 2.  Inability to urinate. 3.  Nausea and/or vomiting. 4.  Extreme swelling or bruising at the surgical site. 5.  Continued bleeding from the incision. 6.  Increased pain, redness or drainage from the incision. 7.  Problems related to your pain medication. 8.  Any problems and/or concerns  May take Tylenol at 6pm

## 2019-10-23 NOTE — Anesthesia Procedure Notes (Signed)
Procedure Name: LMA Insertion Date/Time: 10/23/2019 11:42 AM Performed by: Genelle Bal, CRNA Pre-anesthesia Checklist: Patient identified, Emergency Drugs available, Suction available and Patient being monitored Patient Re-evaluated:Patient Re-evaluated prior to induction Oxygen Delivery Method: Circle system utilized Preoxygenation: Pre-oxygenation with 100% oxygen Induction Type: IV induction Ventilation: Mask ventilation without difficulty LMA: LMA inserted LMA Size: 4.0 Number of attempts: 1 Airway Equipment and Method: Bite block Placement Confirmation: positive ETCO2 Tube secured with: Tape Dental Injury: Teeth and Oropharynx as per pre-operative assessment

## 2019-10-23 NOTE — Interval H&P Note (Signed)
History and Physical Interval Note:  10/23/2019 11:20 AM  Amanda Roberts  has presented today for surgery, with the diagnosis of LEFT BREAST PAPILLOMA.  The various methods of treatment have been discussed with the patient and family. After consideration of risks, benefits and other options for treatment, the patient has consented to  Procedure(s): LEFT BREAST LUMPECTOMY WITH RADIOACTIVE SEED LOCALIZATION (Left) as a surgical intervention.  The patient's history has been reviewed, patient examined, no change in status, stable for surgery.  I have reviewed the patient's chart and labs.  Questions were answered to the patient's satisfaction.     [name]

## 2019-10-23 NOTE — Transfer of Care (Signed)
Immediate Anesthesia Transfer of Care Note  Patient: Amanda Roberts  Procedure(s) Performed: LEFT BREAST LUMPECTOMY WITH RADIOACTIVE SEED LOCALIZATION (Left Breast)  Patient Location: PACU  Anesthesia Type:General  Level of Consciousness: drowsy and patient cooperative  Airway & Oxygen Therapy: Patient Spontanous Breathing and Patient connected to face mask oxygen  Post-op Assessment: Report given to RN and Post -op Vital signs reviewed and stable  Post vital signs: Reviewed and stable  Last Vitals:  Vitals Value Taken Time  BP 127/69   Temp    Pulse 78 10/23/19 1242  Resp 11 10/23/19 1242  SpO2 100 % 10/23/19 1242  Vitals shown include unvalidated device data.  Last Pain:  Vitals:   10/23/19 1044  TempSrc: Tympanic  PainSc: 5       Patients Stated Pain Goal: 5 ([id number])  Complications: No apparent anesthesia complications

## 2019-10-23 NOTE — Op Note (Signed)
10/23/2019  12:33 PM  PATIENT:  Amanda Roberts  63 y.o. female  PRE-OPERATIVE DIAGNOSIS:  LEFT BREAST PAPILLOMA  POST-OPERATIVE DIAGNOSIS:  LEFT BREAST PAPILLOMA  PROCEDURE:  Procedure(s): LEFT BREAST LUMPECTOMY WITH RADIOACTIVE SEED LOCALIZATION (Left)  SURGEON:  Surgeon(s) and Role:    * Jovita Kussmaul, MD - Primary  PHYSICIAN ASSISTANT:   ASSISTANTS: none   ANESTHESIA:   local and general  EBL:  minimal   BLOOD ADMINISTERED:none  DRAINS: none   LOCAL MEDICATIONS USED:  MARCAINE     SPECIMEN:  Source of Specimen:  left breast tissue with additional margin  DISPOSITION OF SPECIMEN:  PATHOLOGY  COUNTS:  YES  TOURNIQUET:  * No tourniquets in log *  DICTATION: .Dragon Dictation   After informed consent was obtained the patient was brought to the operating room and placed in the supine position on the operating table.  After adequate induction of general anesthesia the patient's left breast was prepped with ChloraPrep, allowed to dry, and draped in usual sterile manner.  An appropriate timeout was performed.  Previously an I-125 seed was placed in the lower outer left breast to mark an area of intraductal papilloma.  The neoprobe was set to I-125 in the area of radioactivity was readily identified.  The area around this was infiltrated with quarter percent Marcaine.  A curvilinear incision was then made along the lower outer edge of the areola of the left breast with a 15 blade knife.  The incision was carried through the skin and subcutaneous tissue sharply with the electrocautery.  Dissection was then carried out sharply with the electrocautery towards the radioactive seed under the direction of the neoprobe.  Once I more closely approached the seed I then removed a circular portion of breast tissue around the radioactive seed while checking the area of radioactivity frequently.  Once the specimen was removed it was oriented with the appropriate paint colors.  A specimen  radiograph showed the seed but no clip.  I removed an additional inferior and medial margin and marked it appropriately.  A specimen radiograph of this showed the clip.  Both specimens were then sent to pathology for further evaluation.  Hemostasis was achieved using the Bovie electrocautery.  The wound was irrigated with saline and infiltrated with more quarter percent Marcaine.  The deep layer of the wound was then closed with layers of interrupted 3-0 Vicryl stitches.  The skin was closed with interrupted 4-0 Monocryl subcuticular stitches.  Dermabond dressings were applied.  The patient tolerated the procedure well.  At the end of the case all needle sponge and instrument counts were correct.  The patient was then awakened and taken to recovery in stable condition.  PLAN OF CARE: Discharge to home after PACU  PATIENT DISPOSITION:  PACU - hemodynamically stable.   Delay start of Pharmacological VTE agent (>24hrs) due to surgical blood loss or risk of bleeding: not applicable

## 2019-10-26 ENCOUNTER — Encounter: Payer: Self-pay | Admitting: *Deleted

## 2019-10-26 NOTE — Anesthesia Postprocedure Evaluation (Signed)
Anesthesia Post Note  Patient: Amanda Roberts  Procedure(s) Performed: LEFT BREAST LUMPECTOMY WITH RADIOACTIVE SEED LOCALIZATION (Left Breast)     Patient location during evaluation: PACU Anesthesia Type: General Level of consciousness: awake and alert Pain management: pain level controlled Vital Signs Assessment: post-procedure vital signs reviewed and stable Respiratory status: spontaneous breathing, nonlabored ventilation and respiratory function stable Cardiovascular status: blood pressure returned to baseline and stable Postop Assessment: no apparent nausea or vomiting Anesthetic complications: no    Last Vitals:  Vitals:   10/23/19 1335 10/23/19 1415  BP:  129/65  Pulse: 66 67  Resp: 11 16  Temp:  36.4 C  SpO2: 100% 99%    Last Pain:  Vitals:   10/23/19 1445  TempSrc:   PainSc: 5                  Lidia Collum

## 2019-10-27 LAB — SURGICAL PATHOLOGY

## 2019-11-20 ENCOUNTER — Telehealth: Payer: Self-pay | Admitting: Hematology and Oncology

## 2019-11-20 NOTE — Telephone Encounter (Signed)
Received a referral from Dr. Marlou Starks for Ms. Amanda Roberts to be seen in the high risk breast clinic for ADH of Left Breast. Amanda Roberts has been cld and scheduled to see Dr. Lindi Adie on 6/11 at 1pm. Pt awar to arrive 15 minutes early.

## 2019-11-24 DIAGNOSIS — I10 Essential (primary) hypertension: Secondary | ICD-10-CM | POA: Diagnosis not present

## 2019-11-24 DIAGNOSIS — Z13220 Encounter for screening for lipoid disorders: Secondary | ICD-10-CM | POA: Diagnosis not present

## 2019-11-26 DIAGNOSIS — Z6830 Body mass index (BMI) 30.0-30.9, adult: Secondary | ICD-10-CM | POA: Diagnosis not present

## 2019-11-26 DIAGNOSIS — J4520 Mild intermittent asthma, uncomplicated: Secondary | ICD-10-CM | POA: Diagnosis not present

## 2019-11-26 DIAGNOSIS — M1711 Unilateral primary osteoarthritis, right knee: Secondary | ICD-10-CM | POA: Diagnosis not present

## 2019-11-26 DIAGNOSIS — I10 Essential (primary) hypertension: Secondary | ICD-10-CM | POA: Diagnosis not present

## 2019-11-26 NOTE — Progress Notes (Signed)
[facility] CONSULT NOTE  Patient Care Team: Lavella Lemons, [place] as PCP - General (Physician Assistant)  CHIEF COMPLAINTS/PURPOSE OF CONSULTATION:  Newly diagnosed high risk for breast cancer  HISTORY OF PRESENTING ILLNESS:  Amanda Roberts 64 y.o. female is here because of recent diagnosis of high risk for breast cancer. Screening mammogram on 08/14/19 showed a left breast asymmetry. Diagnostic mammogram on 08/25/19 showed a 0.9cm mass at the 6 o'clock position in the left breast and 3 left axillary lymph nodes with cortical thickening. Biopsy on 09/01/19 showed fibrocystic chances with apocrine metaplasia in the breast and benign lymph nodes in the axilla. Biopsy of the left breast on 09/11/19 showed intraductal papilloma. She underwent a left lumpectomy on 10/23/19 for which pathology showed Intraductal papilloma with atypical ductal hyperplasia. She presents to the clinic today for initial evaluation and to discuss surveillance options.   I reviewed her records extensively and collaborated the history with the patient.  SUMMARY OF ONCOLOGIC HISTORY: Oncology History   No history exists.    MEDICAL HISTORY:  Past Medical History:  Diagnosis Date  . Asthma   . Hypertension   . PONV (postoperative nausea and vomiting)     SURGICAL HISTORY: Past Surgical History:  Procedure Laterality Date  . ABDOMINAL HYSTERECTOMY    . BREAST LUMPECTOMY WITH RADIOACTIVE SEED LOCALIZATION Left 10/23/2019   Procedure: LEFT BREAST LUMPECTOMY WITH RADIOACTIVE SEED LOCALIZATION;  Surgeon: Jovita Kussmaul, MD;  Location: [facility];  Service: General;  Laterality: Left;  . CESAREAN SECTION    . DG GALL BLADDER    . FOOT SURGERY      SOCIAL HISTORY: Social History   Socioeconomic History  . Marital status: Married    Spouse name: Not on file  . Number of children: Not on file  . Years of education: Not on file  . Highest education level: Not on file  Occupational History    . Not on file  Tobacco Use  . Smoking status: Never Smoker  . Smokeless tobacco: Never Used  Substance and Sexual Activity  . Alcohol use: Never  . Drug use: Never  . Sexual activity: Not on file  Other Topics Concern  . Not on file  Social History Narrative  . Not on file   Social Determinants of Health   Financial Resource Strain:   . Difficulty of Paying Living Expenses:   Food Insecurity:   . Worried About Charity fundraiser in the Last Year:   . Arboriculturist in the Last Year:   Transportation Needs:   . Film/video editor (Medical):   Marland Kitchen Lack of Transportation (Non-Medical):   Physical Activity:   . Days of Exercise per Week:   . Minutes of Exercise per Session:   Stress:   . Feeling of Stress :   Social Connections:   . Frequency of Communication with Friends and Family:   . Frequency of Social Gatherings with Friends and Family:   . Attends Religious Services:   . Active Member of Clubs or Organizations:   . Attends Archivist Meetings:   Marland Kitchen Marital Status:   Intimate Partner Violence:   . Fear of Current or Ex-Partner:   . Emotionally Abused:   Marland Kitchen Physically Abused:   . Sexually Abused:     FAMILY HISTORY: Family History  Problem Relation Age of Onset  . Heart attack Mother   . Diabetes Mother   . Heart attack Father   .  Diabetes Father   . Heart attack Brother   . Anuerysm Maternal Grandmother   . Heart attack Maternal Grandfather   . Heart attack Paternal Grandmother   . Heart attack Paternal Grandfather     ALLERGIES:  is allergic to levaquin [levofloxacin in d5w] and sulfa antibiotics.  MEDICATIONS:  Current Outpatient Medications  Medication Sig Dispense Refill  . albuterol (PROVENTIL HFA;VENTOLIN HFA) 108 (90 Base) MCG/ACT inhaler Inhale 2 puffs into the lungs as directed.    . cyclobenzaprine (FLEXERIL) 10 MG tablet Take 1 tablet by mouth daily as needed for muscle spasms.    . furosemide (LASIX) 40 MG tablet Take 1 tablet  by mouth daily as needed.    . hydrochlorothiazide (HYDRODIURIL) 25 MG tablet Take 25 mg by mouth daily.    Marland Kitchen HYDROcodone-acetaminophen (NORCO/VICODIN) 5-325 MG tablet Take 1-2 tablets by mouth every 6 (six) hours as needed for moderate pain or severe pain. 10 tablet 0  . meloxicam (MOBIC) 15 MG tablet Take 1 tablet by mouth daily as needed for pain.    . metoprolol succinate (TOPROL-XL) 50 MG 24 hr tablet Take 1 tablet by mouth daily.    . montelukast (SINGULAIR) 10 MG tablet Take 10 mg by mouth as directed.    . Multiple Vitamin (MULTIVITAMIN) tablet Take 1 tablet by mouth daily.    Marland Kitchen zinc gluconate 50 MG tablet Take 50 mg by mouth daily.     No current facility-administered medications for this visit.    REVIEW OF SYSTEMS:   Constitutional: Denies fevers, chills or abnormal night sweats Eyes: Denies blurriness of vision, double vision or watery eyes Ears, nose, mouth, throat, and face: Denies mucositis or sore throat Respiratory: Denies cough, dyspnea or wheezes Cardiovascular: Denies palpitation, chest discomfort or lower extremity swelling Gastrointestinal:  Denies nausea, heartburn or change in bowel habits Skin: Denies abnormal skin rashes Lymphatics: Denies new lymphadenopathy or easy bruising Neurological:Denies numbness, tingling or new weaknesses Behavioral/Psych: Mood is stable, no new changes  Breast: Denies any palpable lumps or discharge All other systems were reviewed with the patient and are negative.  PHYSICAL EXAMINATION: ECOG PERFORMANCE STATUS: 1 - Symptomatic but completely ambulatory  Vitals:   11/27/19 1250  BP: 132/71  Pulse: 66  Resp: 19  Temp: 98.5 F (36.9 C)  SpO2: 98%   Filed Weights   11/27/19 1250  Weight: 169 lb 3.2 oz (76.7 kg)    GENERAL:alert, no distress and comfortable SKIN: skin color, texture, turgor are normal, no rashes or significant lesions EYES: normal, conjunctiva are pink and non-injected, sclera clear OROPHARYNX:no exudate,  no erythema and lips, buccal mucosa, and tongue normal  NECK: supple, thyroid normal size, non-tender, without nodularity LYMPH:  no palpable lymphadenopathy in the cervical, axillary or inguinal LUNGS: clear to auscultation and percussion with normal breathing effort HEART: regular rate & rhythm and no murmurs and no lower extremity edema ABDOMEN:abdomen soft, non-tender and normal bowel sounds Musculoskeletal:no cyanosis of digits and no clubbing  PSYCH: alert & oriented x 3 with fluent speech NEURO: no focal motor/sensory deficits BREAST: No palpable nodules in breast. No palpable axillary or supraclavicular lymphadenopathy (exam performed in the presence of a chaperone)   LABORATORY DATA:  I have reviewed the data as listed No results found for: WBC, HGB, HCT, MCV, PLT Lab Results  Component Value Date   NA 140 10/20/2019   K 4.2 10/20/2019   CL 105 10/20/2019   CO2 27 10/20/2019    RADIOGRAPHIC STUDIES: I have personally  reviewed the radiological reports and agreed with the findings in the report.  ASSESSMENT AND PLAN:  Atypical ductal hyperplasia of left breast left lumpectomy on 10/23/19 for which pathology showed Intraductal papilloma with atypical ductal hyperplasia  Pathology review: I discussed the difference between atypical ductal hyperplasia, DCIS and invasive breast cancer. I explained to her that atypical ductal hyperplasia is characterized by a proliferation of uniform epithelial cells filling part, but not the entirety, of the involved duct. ADH is associated with an increased risk of both ipsilateral and contralateral breast cancer and thus provides evidence of underlying breast abnormalities that predispose to breast cancer.   Prognosis:Using the American Cancer Society breast cancer risk assessment tool, her risk of breast cancer in 5 years is at 1.1% ( average woman's risk is 0.6%), her lifetime risk of breast cancers at 15.4% ( average risk is a 10%)  I  discussed the risks and benefits of tamoxifen therapy.    Recommendation: 1. Exercise 30 minutes daily 2. Weight loss 3. Increasing fruits and vegetables and less red meat I believe all of the above would extensively decrease her risk of breast cancer as well as other cancers including cancers of the colon substantially.  Surveillance plan: Annual mammograms and breast exams Patient agreed to start tamoxifen therapy.  Instructed to start at 10 mg for a month and then increase to 20 mg. Return to clinic in 3 months for toxicity evaluation.  After that we can see her once a year if she tolerates it well.   All questions were answered. The patient knows to call the clinic with any problems, questions or concerns.   Rulon Eisenmenger, MD, MPH 11/27/2019    I, Molly Dorshimer, am acting as scribe for Nicholas Lose, MD.  I have reviewed the above documentation for accuracy and completeness, and I agree with the above.

## 2019-11-27 ENCOUNTER — Other Ambulatory Visit: Payer: Self-pay

## 2019-11-27 ENCOUNTER — Inpatient Hospital Stay: Payer: BC Managed Care – PPO | Attending: Hematology and Oncology | Admitting: Hematology and Oncology

## 2019-11-27 DIAGNOSIS — N6092 Unspecified benign mammary dysplasia of left breast: Secondary | ICD-10-CM | POA: Diagnosis not present

## 2019-11-27 MED ORDER — TAMOXIFEN CITRATE 20 MG PO TABS
20.0000 mg | ORAL_TABLET | Freq: Every day | ORAL | 3 refills | Status: DC
Start: 2019-11-27 — End: 2020-03-03

## 2019-11-27 NOTE — Assessment & Plan Note (Signed)
left lumpectomy on 10/23/19 for which pathology showed Intraductal papilloma with atypical ductal hyperplasia  Pathology review: I discussed the difference between atypical ductal hyperplasia, DCIS and invasive breast cancer. I explained to her that atypical ductal hyperplasia is characterized by a proliferation of uniform epithelial cells filling part, but not the entirety, of the involved duct. ADH is associated with an increased risk of both ipsilateral and contralateral breast cancer and thus provides evidence of underlying breast abnormalities that predispose to breast cancer.   Prognosis:Using the American Cancer Society breast cancer risk assessment tool, her risk of breast cancer in 5 years is at 1.1% ( average woman's risk is 0.6%), her lifetime risk of breast cancers at 15.4% ( average risk is a 10%)  I discussed the risks and benefits of tamoxifen therapy.    Recommendation: 1. Exercise 30 minutes daily 2. Weight loss 3. Increasing fruits and vegetables and less red meat I believe all of the above would extensively decrease her risk of breast cancer as well as other cancers including cancers of the colon substantially.  Surveillance plan: Annual mammograms and breast exams

## 2019-12-11 ENCOUNTER — Telehealth: Payer: Self-pay | Admitting: Hematology and Oncology

## 2019-12-11 NOTE — Telephone Encounter (Signed)
Scheduled per los, patient has been called and notified. 

## 2019-12-28 ENCOUNTER — Encounter: Payer: Self-pay | Admitting: General Practice

## 2020-03-02 NOTE — Progress Notes (Signed)
Patient Care Team: Lavella Lemons, PA as PCP - General (Physician Assistant)  DIAGNOSIS:    ICD-10-CM   1. At high risk for breast cancer  Z91.89 MR BREAST BILATERAL W WO CONTRAST INC CAD    MM DIAG BREAST TOMO BILATERAL  2. Atypical ductal hyperplasia of left breast  N60.92 MR BREAST BILATERAL W WO CONTRAST INC CAD    MM DIAG BREAST TOMO BILATERAL    CHIEF COMPLIANT: Follow-up of high risk for breast cancer   INTERVAL HISTORY: Amanda Roberts is a 64 y.o. with above-mentioned history of high risk for breast cancer currently on risk reduction therapy with tamoxifen. She presents to the clinic today for follow-up.  She is tolerating tamoxifen extremely well.  She does feel that her hands and feet do swell and that she takes Lasix for that.  She has a feeling of warmth but denies any hot flashes.  ALLERGIES:  is allergic to levaquin [levofloxacin in d5w] and sulfa antibiotics.  MEDICATIONS:  Current Outpatient Medications  Medication Sig Dispense Refill  . albuterol (PROVENTIL HFA;VENTOLIN HFA) 108 (90 Base) MCG/ACT inhaler Inhale 2 puffs into the lungs as directed.    . cyclobenzaprine (FLEXERIL) 10 MG tablet Take 1 tablet by mouth daily as needed for muscle spasms.    . furosemide (LASIX) 40 MG tablet Take 1 tablet by mouth daily as needed.    . hydrochlorothiazide (HYDRODIURIL) 25 MG tablet Take 25 mg by mouth daily.    . meloxicam (MOBIC) 15 MG tablet Take 1 tablet by mouth daily as needed for pain.    . metoprolol succinate (TOPROL-XL) 50 MG 24 hr tablet Take 1 tablet by mouth daily.    . montelukast (SINGULAIR) 10 MG tablet Take 10 mg by mouth as directed.    . Multiple Vitamin (MULTIVITAMIN) tablet Take 1 tablet by mouth daily.    . tamoxifen (NOLVADEX) 20 MG tablet Take 1 tablet (20 mg total) by mouth daily. 90 tablet 3  . zinc gluconate 50 MG tablet Take 50 mg by mouth daily.     No current facility-administered medications for this visit.    PHYSICAL EXAMINATION: ECOG  PERFORMANCE STATUS: 1 - Symptomatic but completely ambulatory  Vitals:   03/03/20 1026  BP: 137/78  Pulse: 67  Resp: 18  Temp: 97.7 F (36.5 C)  SpO2: 100%   Filed Weights   03/03/20 1026  Weight: 169 lb 4.8 oz (76.8 kg)    LABORATORY DATA:  I have reviewed the data as listed CMP Latest Ref Rng & Units 10/20/2019  Glucose 70 - 99 mg/dL 102(H)  BUN 8 - 23 mg/dL 12  Creatinine 0.44 - 1.00 mg/dL 0.76  Sodium 135 - 145 mmol/L 140  Potassium 3.5 - 5.1 mmol/L 4.2  Chloride 98 - 111 mmol/L 105  CO2 22 - 32 mmol/L 27  Calcium 8.9 - 10.3 mg/dL 9.2    No results found for: WBC, HGB, HCT, MCV, PLT, NEUTROABS  ASSESSMENT & PLAN:  Atypical ductal hyperplasia of left breast left lumpectomy on 10/23/19 for which pathology showedIntraductal papilloma with atypical ductal hyperplasia  Prognosis: Tyra Cusick risk score: Lifetime risk 25%  Current treatment: Tamoxifen 20 mg daily started 11/27/2019 I renewed her prescription for another year  Breast cancer surveillance:   1.  Mammograms to be done in March 2022 2.  We will order a breast MRI to be done in the next month  Return to clinic in 1 year for follow-up  Orders Placed This Encounter  Procedures  . MR BREAST BILATERAL W WO CONTRAST INC CAD    Standing Status:   Future    Standing Expiration Date:   03/03/2021    Order Specific Question:   If indicated for the ordered procedure, I authorize the administration of contrast media per Radiology protocol    Answer:   Yes    Order Specific Question:   What is the patient's sedation requirement?    Answer:   No Sedation    Order Specific Question:   Does the patient have a pacemaker or implanted devices?    Answer:   No    Order Specific Question:   Radiology Contrast Protocol - do NOT remove file path    Answer:   [email or web address]    Order Specific Question:   Preferred imaging location?    Answer:   [facility] (table  limit-550lbs)    Order Specific Question:   Release to patient    Answer:   Immediate  . MM DIAG BREAST TOMO BILATERAL    Standing Status:   Future    Standing Expiration Date:   03/03/2021    Order Specific Question:   Reason for Exam (SYMPTOM  OR DIAGNOSIS REQUIRED)    Answer:   High risk for breast cancer, annual mammogram, atypical hyperplasia    Order Specific Question:   Preferred imaging location?    Answer:   [facility]   The patient has a good understanding of the overall plan. she agrees with it. she will call with any problems that may develop before the next visit here.  Total time spent: 20 mins including face to face time and time spent for planning, charting and coordination of care  Nicholas Lose, MD 03/03/2020  I, Cloyde Reams Dorshimer, am acting as scribe for Dr. Nicholas Lose.  I have reviewed the above documentation for accuracy and completeness, and I agree with the above.

## 2020-03-03 ENCOUNTER — Inpatient Hospital Stay: Payer: BC Managed Care – PPO | Attending: Hematology and Oncology | Admitting: Hematology and Oncology

## 2020-03-03 ENCOUNTER — Telehealth: Payer: Self-pay | Admitting: Hematology and Oncology

## 2020-03-03 ENCOUNTER — Other Ambulatory Visit: Payer: Self-pay

## 2020-03-03 DIAGNOSIS — N6092 Unspecified benign mammary dysplasia of left breast: Secondary | ICD-10-CM | POA: Diagnosis not present

## 2020-03-03 DIAGNOSIS — Z79810 Long term (current) use of selective estrogen receptor modulators (SERMs): Secondary | ICD-10-CM | POA: Diagnosis not present

## 2020-03-03 DIAGNOSIS — Z9189 Other specified personal risk factors, not elsewhere classified: Secondary | ICD-10-CM | POA: Insufficient documentation

## 2020-03-03 MED ORDER — TAMOXIFEN CITRATE 20 MG PO TABS: 20.0000 mg | ORAL_TABLET | Freq: Every day | ORAL | 3 refills | Status: DC

## 2020-03-03 NOTE — Telephone Encounter (Signed)
Scheduled appts per 9/16 los. Gave pt a print out of AVS.

## 2020-03-03 NOTE — Assessment & Plan Note (Signed)
left lumpectomy on 10/23/19 for which pathology showedIntraductal papilloma with atypical ductal hyperplasia  Prognosis:Using the American Cancer Society breast cancer risk assessment tool, her risk of breast cancer in 5 years is at 1.1% ( average woman's risk is 0.6%), her lifetime risk of breast cancers at 15.4% ( average risk is a 10%)  Current treatment: Tamoxifen 20 mg daily started 11/27/2019 Breast cancer surveillance: Patient will need annual mammograms and breast exams

## 2020-04-02 ENCOUNTER — Other Ambulatory Visit: Payer: Self-pay

## 2020-04-02 ENCOUNTER — Ambulatory Visit
Admission: RE | Admit: 2020-04-02 | Discharge: 2020-04-02 | Disposition: A | Discharge status: Home / Self Care | Payer: BC Managed Care – PPO | Source: Ambulatory Visit | Attending: Hematology and Oncology | Admitting: Hematology and Oncology

## 2020-04-02 DIAGNOSIS — N6325 Unspecified lump in the left breast, overlapping quadrants: Secondary | ICD-10-CM | POA: Diagnosis not present

## 2020-04-02 DIAGNOSIS — N6092 Unspecified benign mammary dysplasia of left breast: Secondary | ICD-10-CM

## 2020-04-02 DIAGNOSIS — Z9189 Other specified personal risk factors, not elsewhere classified: Secondary | ICD-10-CM

## 2020-04-02 MED ORDER — GADOBUTROL 1 MMOL/ML IV SOLN
7.0000 mL | Freq: Once | INTRAVENOUS | Status: AC | PRN
  Administered 2020-04-02: 7 mL via INTRAVENOUS

## 2020-04-05 ENCOUNTER — Other Ambulatory Visit: Payer: Self-pay | Admitting: Hematology and Oncology

## 2020-04-05 ENCOUNTER — Other Ambulatory Visit: Payer: Self-pay | Admitting: *Deleted

## 2020-04-05 DIAGNOSIS — N6092 Unspecified benign mammary dysplasia of left breast: Secondary | ICD-10-CM

## 2020-04-05 NOTE — Progress Notes (Signed)
Pt recent mammogram showed Indeterminate 1.1 cm mass in the 3 o'clock retroareolar region of the left breast.  Per MD orders to be placed for a MRI guided core biopsy of the left breast.

## 2020-04-14 ENCOUNTER — Ambulatory Visit
Admission: RE | Admit: 2020-04-14 | Discharge: 2020-04-14 | Disposition: A | Discharge status: Home / Self Care | Payer: BC Managed Care – PPO | Source: Ambulatory Visit | Attending: Hematology and Oncology | Admitting: Hematology and Oncology

## 2020-04-14 ENCOUNTER — Other Ambulatory Visit (HOSPITAL_COMMUNITY): Payer: Self-pay | Admitting: Diagnostic Radiology

## 2020-04-14 ENCOUNTER — Other Ambulatory Visit: Payer: Self-pay

## 2020-04-14 DIAGNOSIS — N6092 Unspecified benign mammary dysplasia of left breast: Secondary | ICD-10-CM

## 2020-04-14 DIAGNOSIS — R928 Other abnormal and inconclusive findings on diagnostic imaging of breast: Secondary | ICD-10-CM | POA: Diagnosis not present

## 2020-04-14 DIAGNOSIS — N6342 Unspecified lump in left breast, subareolar: Secondary | ICD-10-CM | POA: Diagnosis not present

## 2020-04-14 DIAGNOSIS — N641 Fat necrosis of breast: Secondary | ICD-10-CM | POA: Diagnosis not present

## 2020-04-14 MED ORDER — GADOBUTROL 1 MMOL/ML IV SOLN
7.0000 mL | Freq: Once | INTRAVENOUS | Status: AC | PRN
  Administered 2020-04-14: 7 mL via INTRAVENOUS

## 2020-05-26 DIAGNOSIS — I10 Essential (primary) hypertension: Secondary | ICD-10-CM | POA: Diagnosis not present

## 2020-05-26 DIAGNOSIS — J4520 Mild intermittent asthma, uncomplicated: Secondary | ICD-10-CM | POA: Diagnosis not present

## 2020-05-26 DIAGNOSIS — Z6828 Body mass index (BMI) 28.0-28.9, adult: Secondary | ICD-10-CM | POA: Diagnosis not present

## 2020-05-26 DIAGNOSIS — M1711 Unilateral primary osteoarthritis, right knee: Secondary | ICD-10-CM | POA: Diagnosis not present

## 2020-06-02 DIAGNOSIS — N6092 Unspecified benign mammary dysplasia of left breast: Secondary | ICD-10-CM | POA: Diagnosis not present

## 2020-08-19 ENCOUNTER — Other Ambulatory Visit (HOSPITAL_COMMUNITY): Payer: Self-pay | Admitting: Hematology and Oncology

## 2020-08-19 DIAGNOSIS — Z1231 Encounter for screening mammogram for malignant neoplasm of breast: Secondary | ICD-10-CM

## 2020-08-24 ENCOUNTER — Encounter (HOSPITAL_COMMUNITY): Payer: Self-pay

## 2020-08-24 ENCOUNTER — Other Ambulatory Visit: Payer: Self-pay

## 2020-08-24 ENCOUNTER — Ambulatory Visit (HOSPITAL_COMMUNITY)
Admission: RE | Admit: 2020-08-24 | Discharge: 2020-08-24 | Disposition: A | Discharge status: Home / Self Care | Payer: 59 | Source: Ambulatory Visit | Attending: Hematology and Oncology | Admitting: Hematology and Oncology

## 2020-08-24 DIAGNOSIS — Z1231 Encounter for screening mammogram for malignant neoplasm of breast: Secondary | ICD-10-CM | POA: Insufficient documentation

## 2020-12-11 ENCOUNTER — Ambulatory Visit
Admission: EM | Admit: 2020-12-11 | Discharge: 2020-12-11 | Disposition: A | Discharge status: Home / Self Care | Payer: 59 | Attending: Emergency Medicine | Admitting: Emergency Medicine

## 2020-12-11 ENCOUNTER — Other Ambulatory Visit: Payer: Self-pay

## 2020-12-11 DIAGNOSIS — R21 Rash and other nonspecific skin eruption: Secondary | ICD-10-CM

## 2020-12-11 MED ORDER — PREDNISONE 10 MG (21) PO TBPK
ORAL_TABLET | ORAL | 0 refills | Status: DC
Start: 2020-12-11 — End: 2022-03-05

## 2020-12-11 MED ORDER — HYDROXYZINE HCL 25 MG PO TABS
25.0000 mg | ORAL_TABLET | Freq: Four times a day (QID) | ORAL | 0 refills | Status: DC | PRN
Start: 2020-12-11 — End: 2022-03-05

## 2020-12-11 NOTE — ED Triage Notes (Signed)
Pt presents with burning type rash on torso and back or past few days, taking new medication and was in sun

## 2020-12-11 NOTE — ED Provider Notes (Signed)
HPI  SUBJECTIVE:  Amanda Roberts is a 65 y.o. female who presents with a painful, pruritic rash that "feels like an open wound" on her torso, back, forearm and right axilla.  She states it started after being in the sun for the past few days.  She started tamoxifen last July, but has been fine since starting it.  She states this feels similar to previous shingles pain.  No fevers, body aches, new lotions, soaps, detergents, recent change in medications, recent antibiotics, crusting or blisters.  She tried triamcinolone with temporary improvement in her symptoms.  Symptoms are worse with palpation and lying down.  She has a history of breast cancer on tamoxifen, hypertension, asthma.  No history of shingles.  ZGY:FVCB, Grace Bushy, PA   Past Medical History:  Diagnosis Date   Asthma    Hypertension    PONV (postoperative nausea and vomiting)     Past Surgical History:  Procedure Laterality Date   ABDOMINAL HYSTERECTOMY     BREAST EXCISIONAL BIOPSY     BREAST LUMPECTOMY WITH RADIOACTIVE SEED LOCALIZATION Left 10/23/2019   Procedure: LEFT BREAST LUMPECTOMY WITH RADIOACTIVE SEED LOCALIZATION;  Surgeon: Jovita Kussmaul, MD;  Location: [facility];  Service: General;  Laterality: Left;   CESAREAN SECTION     DG GALL BLADDER     FOOT SURGERY      Family History  Problem Relation Age of Onset   Heart attack Mother    Diabetes Mother    Heart attack Father    Diabetes Father    Heart attack Brother    Anuerysm Maternal Grandmother    Heart attack Maternal Grandfather    Heart attack Paternal Grandmother    Heart attack Paternal Grandfather     Social History   Tobacco Use   Smoking status: Never   Smokeless tobacco: Never  Substance Use Topics   Alcohol use: Never   Drug use: Never    No current facility-administered medications for this encounter.  Current Outpatient Medications:    hydrOXYzine (ATARAX/VISTARIL) 25 MG tablet, Take 1 tablet (25 mg total) by mouth  every 6 (six) hours as needed for itching., Disp: 20 tablet, Rfl: 0   predniSONE (STERAPRED UNI-PAK 21 TAB) 10 MG (21) TBPK tablet, Dispense one 6 day pack. Take as directed with food., Disp: 21 tablet, Rfl: 0   albuterol (PROVENTIL HFA;VENTOLIN HFA) 108 (90 Base) MCG/ACT inhaler, Inhale 2 puffs into the lungs as directed., Disp: , Rfl:    cyclobenzaprine (FLEXERIL) 10 MG tablet, Take 1 tablet by mouth daily as needed for muscle spasms., Disp: , Rfl:    furosemide (LASIX) 40 MG tablet, Take 1 tablet by mouth daily as needed., Disp: , Rfl:    hydrochlorothiazide (HYDRODIURIL) 25 MG tablet, Take 25 mg by mouth daily., Disp: , Rfl:    meloxicam (MOBIC) 15 MG tablet, Take 1 tablet by mouth daily as needed for pain., Disp: , Rfl:    metoprolol succinate (TOPROL-XL) 50 MG 24 hr tablet, Take 1 tablet by mouth daily., Disp: , Rfl:    montelukast (SINGULAIR) 10 MG tablet, Take 10 mg by mouth as directed., Disp: , Rfl:    Multiple Vitamin (MULTIVITAMIN) tablet, Take 1 tablet by mouth daily., Disp: , Rfl:    tamoxifen (NOLVADEX) 20 MG tablet, Take 1 tablet (20 mg total) by mouth daily., Disp: 90 tablet, Rfl: 3   zinc gluconate 50 MG tablet, Take 50 mg by mouth daily., Disp: , Rfl:   Allergies  Allergen Reactions   Levaquin [Levofloxacin In D5w] Rash   Sulfa Antibiotics Rash     ROS  As noted in HPI.   Physical Exam  BP 134/83   Pulse 68   Temp 98.5 F (36.9 C)   Resp 20   SpO2 98%   Constitutional: Well developed, well nourished, no acute distress Eyes:  EOMI, conjunctiva normal bilaterally HENT: Normocephalic, atraumatic,mucus membranes moist Respiratory: Normal inspiratory effort Cardiovascular: Normal rate GI: nondistended skin: Erythematous, flat, symmetric, nontender rash on back, chest, between breasts, right axilla and right AC fossa.  No blisters, excoriations, crusting.           Musculoskeletal: no deformities Neurologic: Alert & oriented x 3, no focal neuro  deficits Psychiatric: Speech and behavior appropriate   ED Course   Medications - No data to display  Orders Placed This Encounter  Procedures   Ambulatory referral to Dermatology    Referral Priority:   Routine    Referral Type:   Consultation    Referral Reason:   Specialty Services Required    Requested Specialty:   Dermatology    Number of Visits Requested:   1    No results found for this or any previous visit (from the past 24 hour(s)). No results found.  ED Clinical Impression  1. Rash and nonspecific skin eruption      ED Assessment/Plan  Unsure as to the etiology of the rash currently it does not appear to be shingles because it is symmetric.  We will try Claritin or Zyrtec, if that does not work, then Atarax.  We will do a 6-day prednisone taper and refer her to [place] [facility] if this does not get better within the week.  Discussed MDM, treatment plan, and plan for follow-up with patient.  patient agrees with plan.   Meds ordered this encounter  Medications   predniSONE (STERAPRED UNI-PAK 21 TAB) 10 MG (21) TBPK tablet    Sig: Dispense one 6 day pack. Take as directed with food.    Dispense:  21 tablet    Refill:  0   hydrOXYzine (ATARAX/VISTARIL) 25 MG tablet    Sig: Take 1 tablet (25 mg total) by mouth every 6 (six) hours as needed for itching.    Dispense:  20 tablet    Refill:  0      *This clinic note was created using [employer]. Therefore, there may be occasional mistakes despite careful proofreading.  ?    Melynda Ripple, MD 12/12/20 [phone]

## 2020-12-11 NOTE — Discharge Instructions (Addendum)
Try Claritin or Zyrtec, and if that does not work, then take the Atarax.  The prednisone should help with the pain and itching.  I have placed a referral to dermatology.  Please follow-up with them if you are not better with these

## 2020-12-22 IMAGING — MG MM BREAST LOCALIZATION CLIP
4 series · 4 of 12 positions shown · non-contrast
Comparison: Previous exam(s).

CLINICAL DATA: Patient status post stereotactic guided core needle
biopsy left breast mass.

EXAM:
DIAGNOSTIC LEFT MAMMOGRAM POST STEREOTACTIC BIOPSY

[L CC synth-2D]
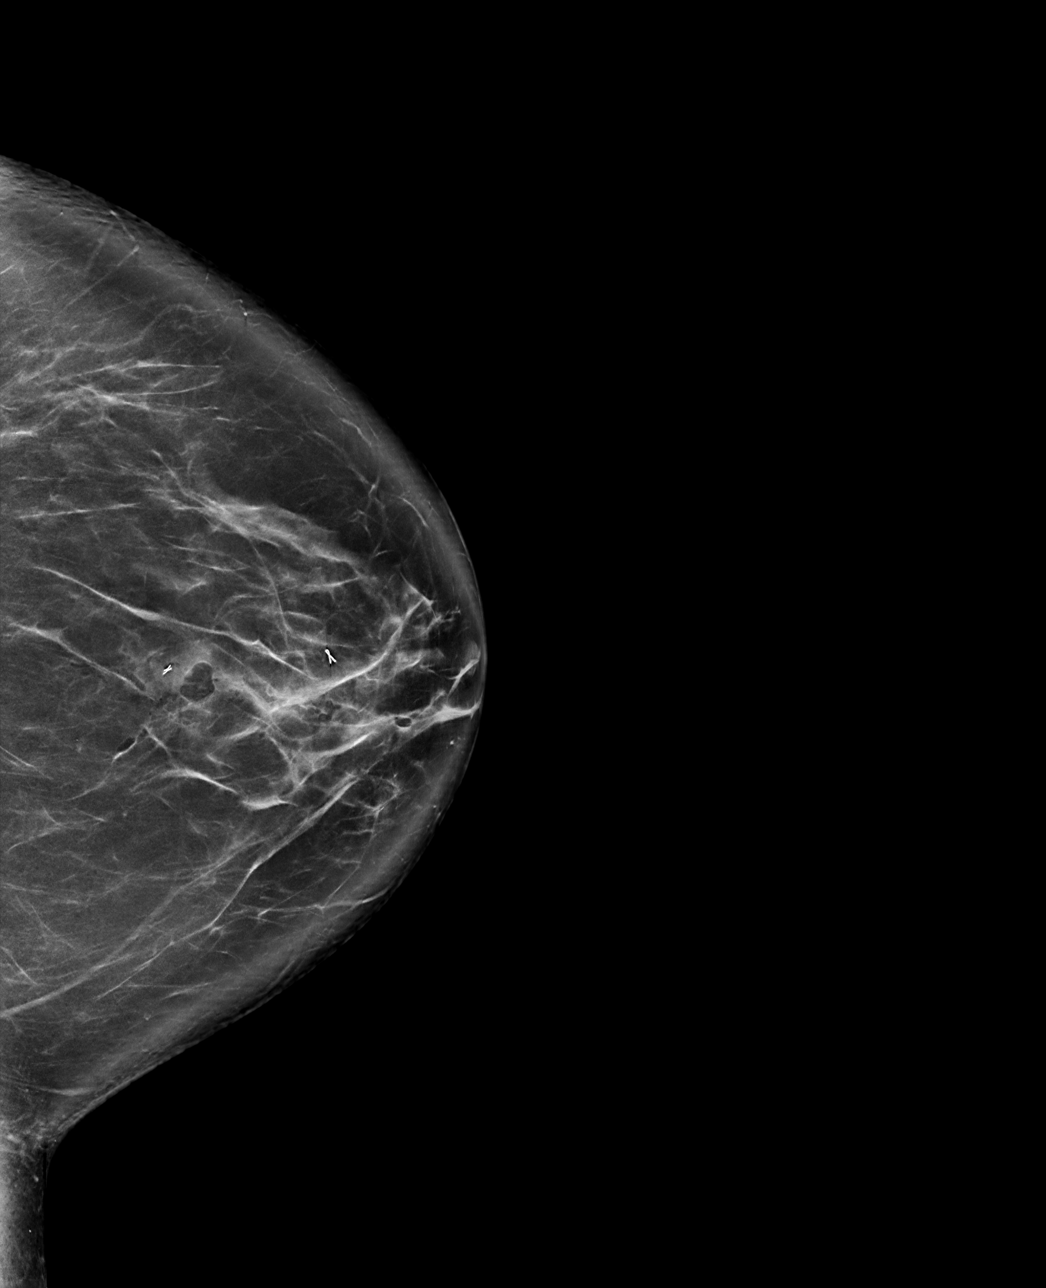

[L ML synth-2D]
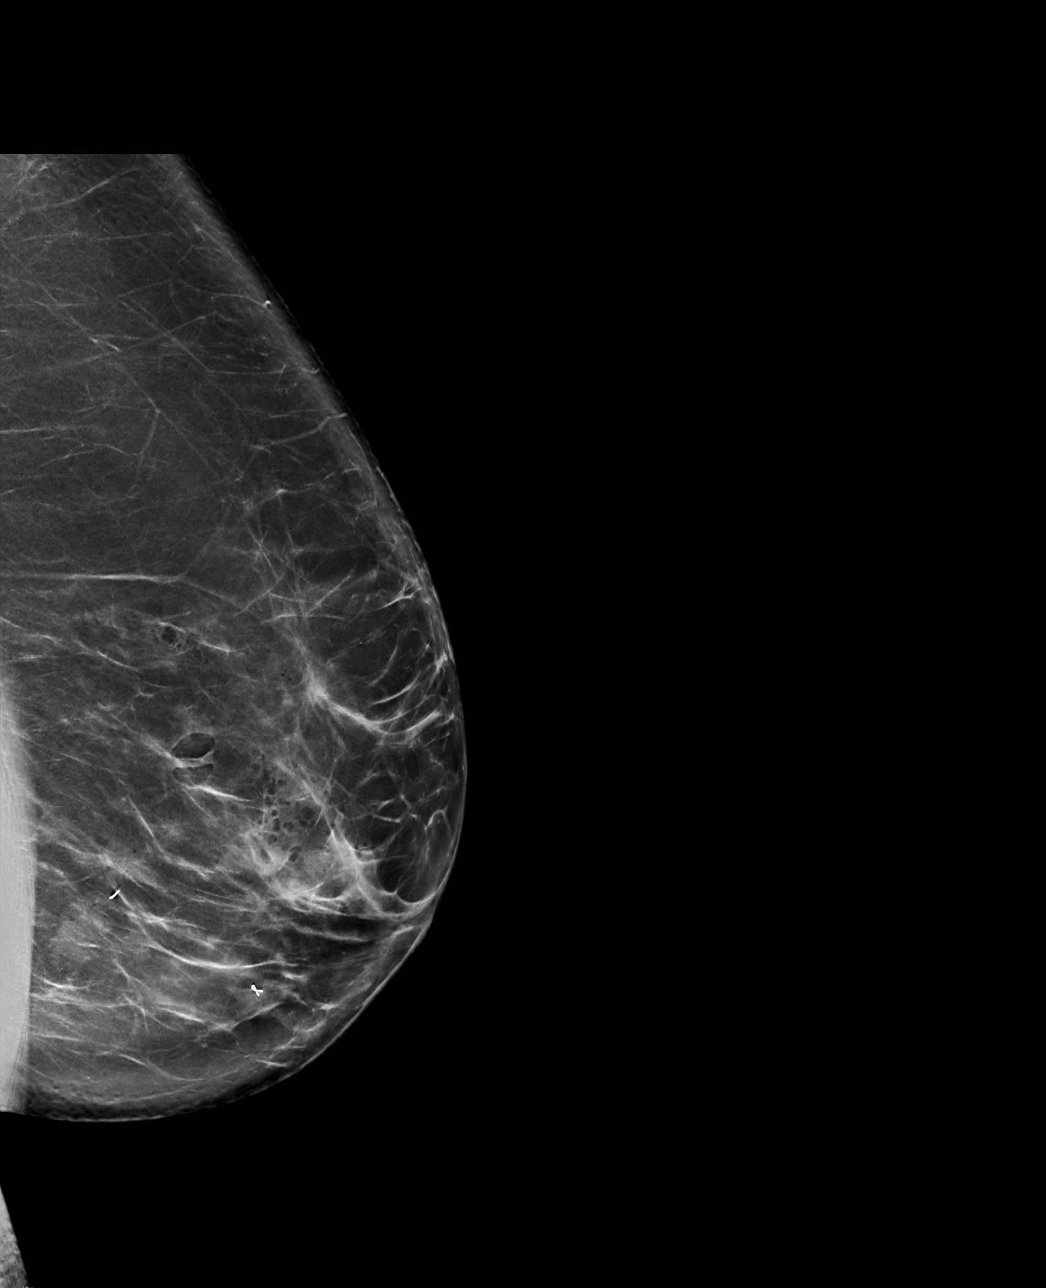

[L ML tomo · tomo slice 45/90.0]
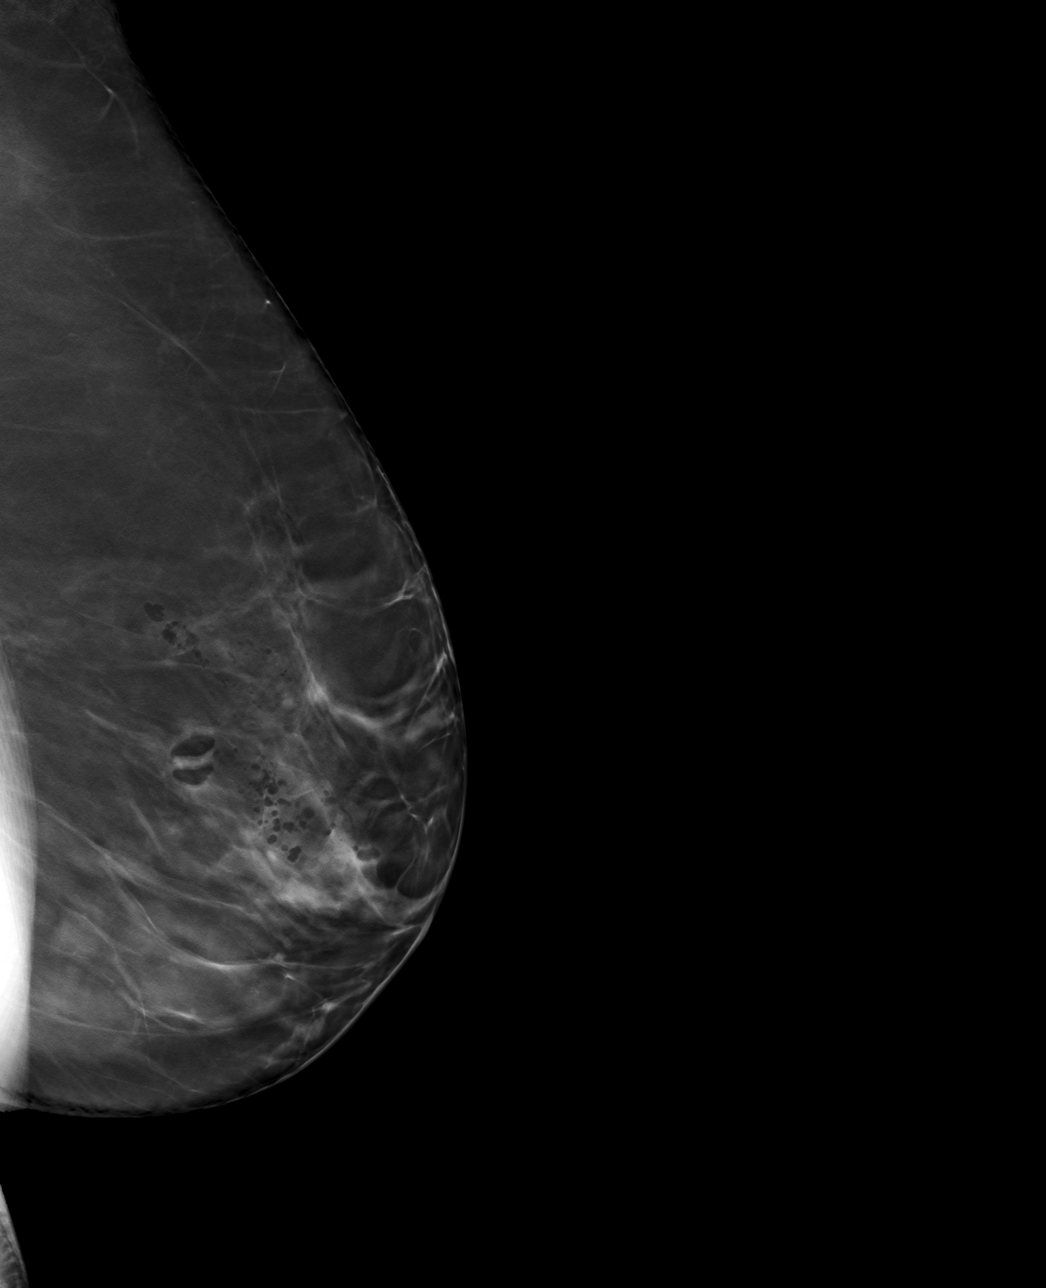

[L CC tomo · tomo slice 47/92.0]
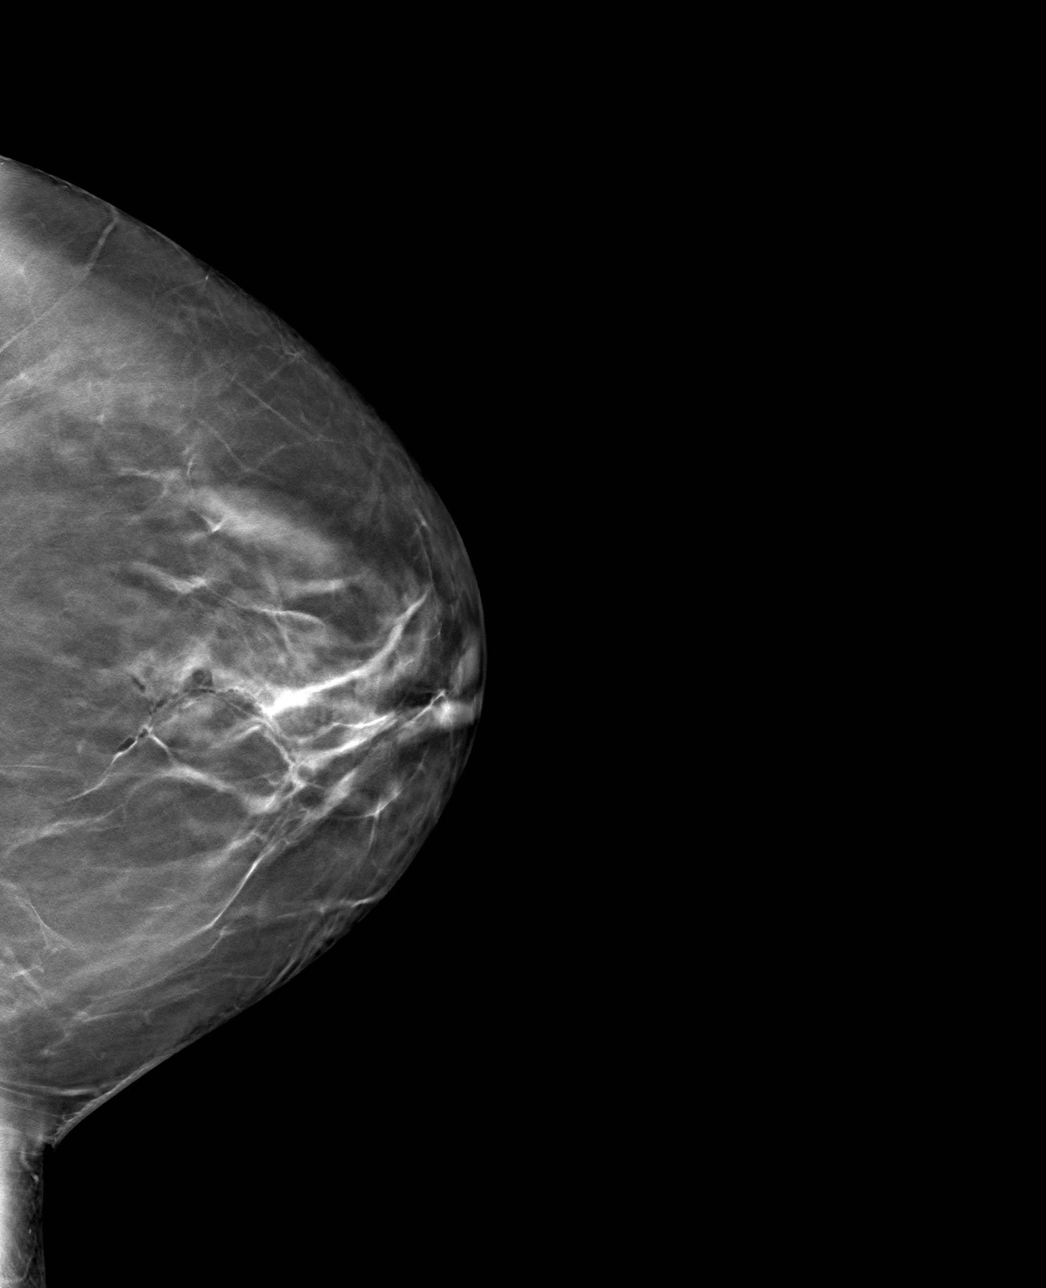

[4 of 12 positions shown; findings below may reference images not displayed]

FINDINGS: Mammographic images were obtained following stereotactic guided
biopsy of left breast mass. The X shaped biopsy marking clip is in
expected position at the site of biopsy. The previously placed
ribbon shaped clip is visualized.
IMPRESSION: Appropriate positioning of the X shaped biopsy marking clip at the
site of biopsy in the central left breast.

Final Assessment: Post Procedure Mammograms for Marker Placement

## 2020-12-22 IMAGING — MG MM BREAST BX W LOC DEV 1ST LESION IMAGE BX SPEC STEREO GUIDE*L*
6 of 9 series · 6 of 21 positions shown · non-contrast
Comparison: Previous exams.
COMPARISON: Previous exams.

Addendum:
CLINICAL DATA: Patient with left breast mass which is a graphically
occult. Patient recently had ultrasound-guided biopsy of a benign
left breast mass.

EXAM:
RIGHT BREAST STEREOTACTIC CORE NEEDLE BIOPSY

[L (1 of 6)]
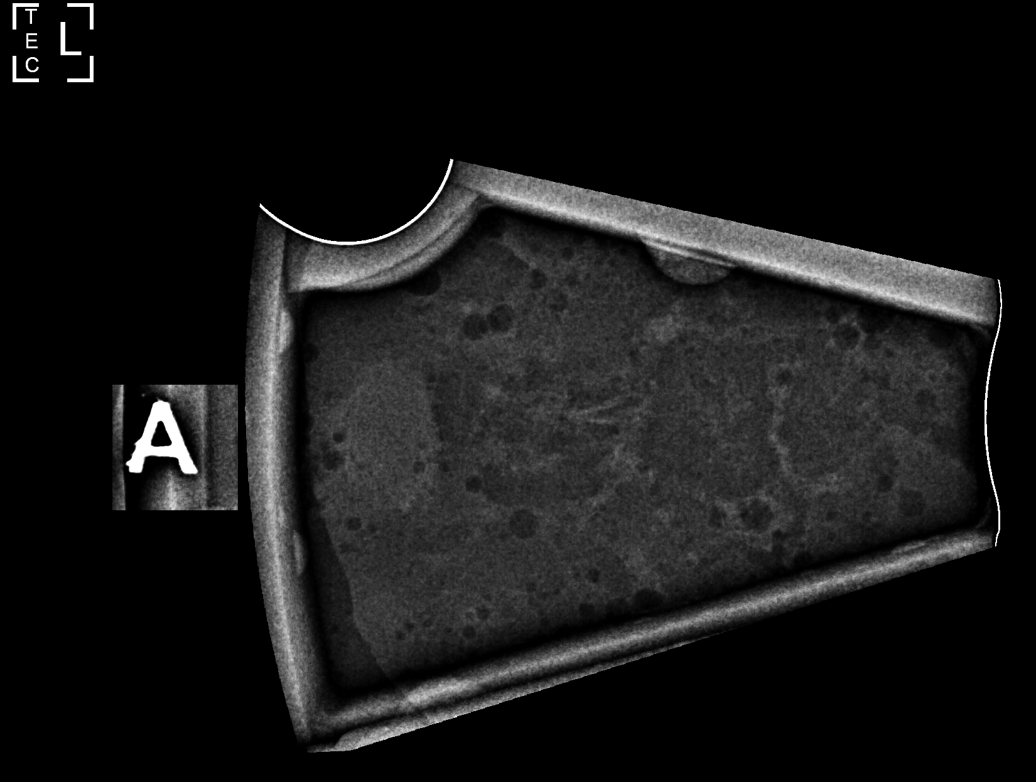

[L (2 of 6)]
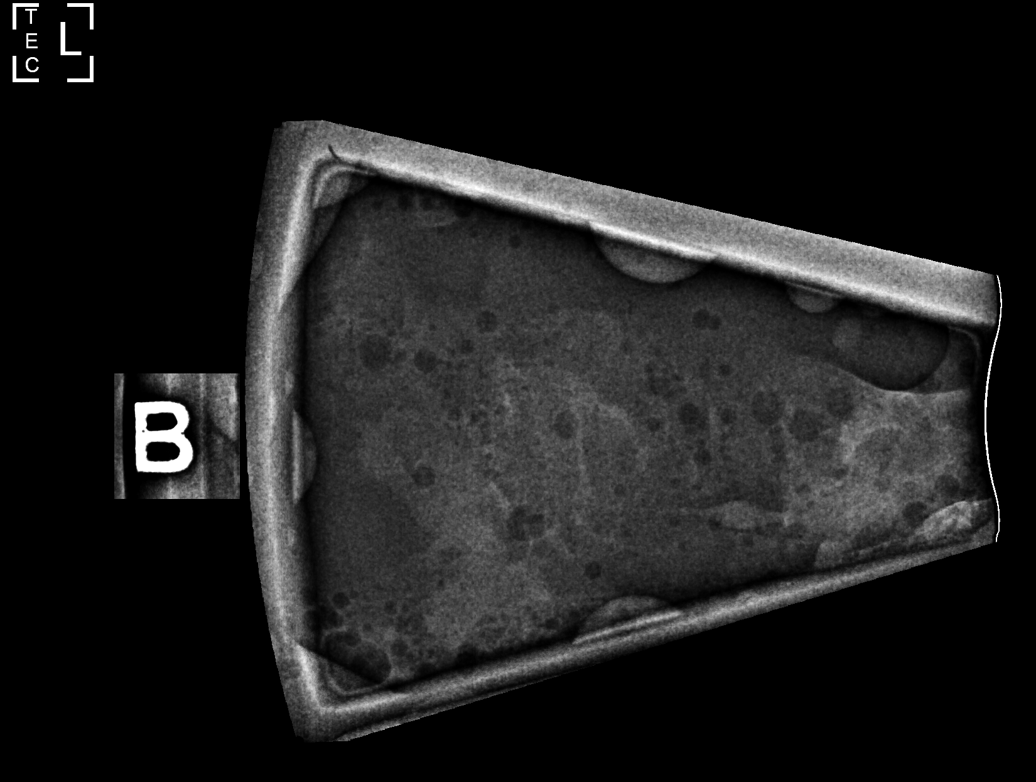

[L (3 of 6)]
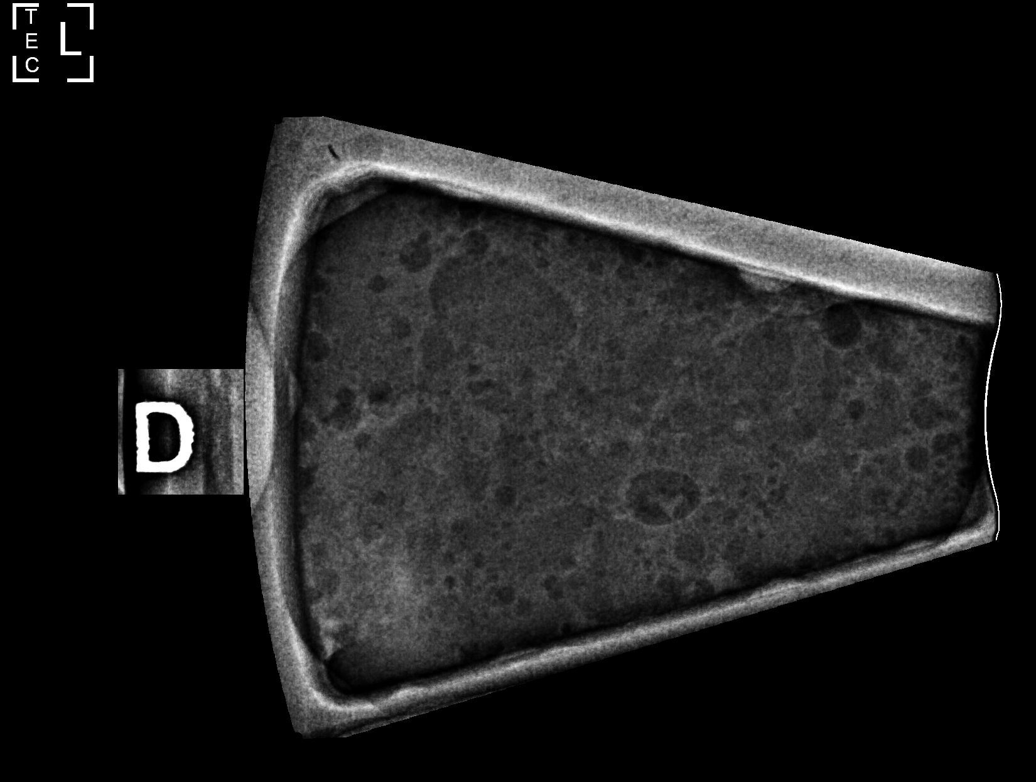

[L (4 of 6)]
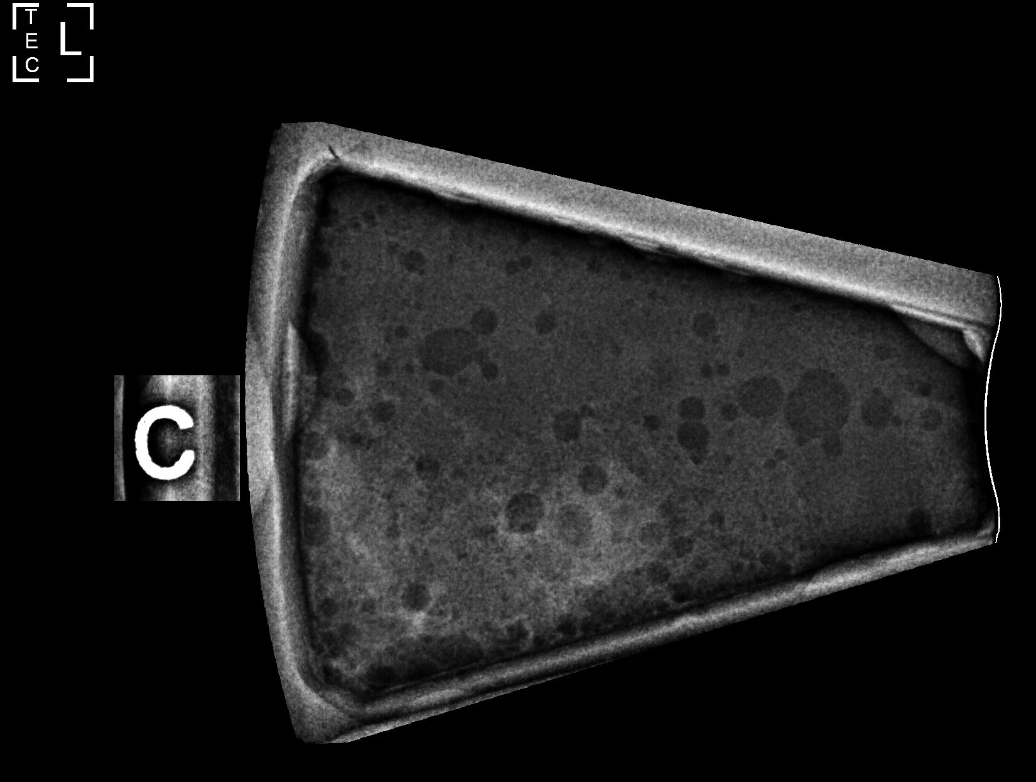

[L (5 of 6)]
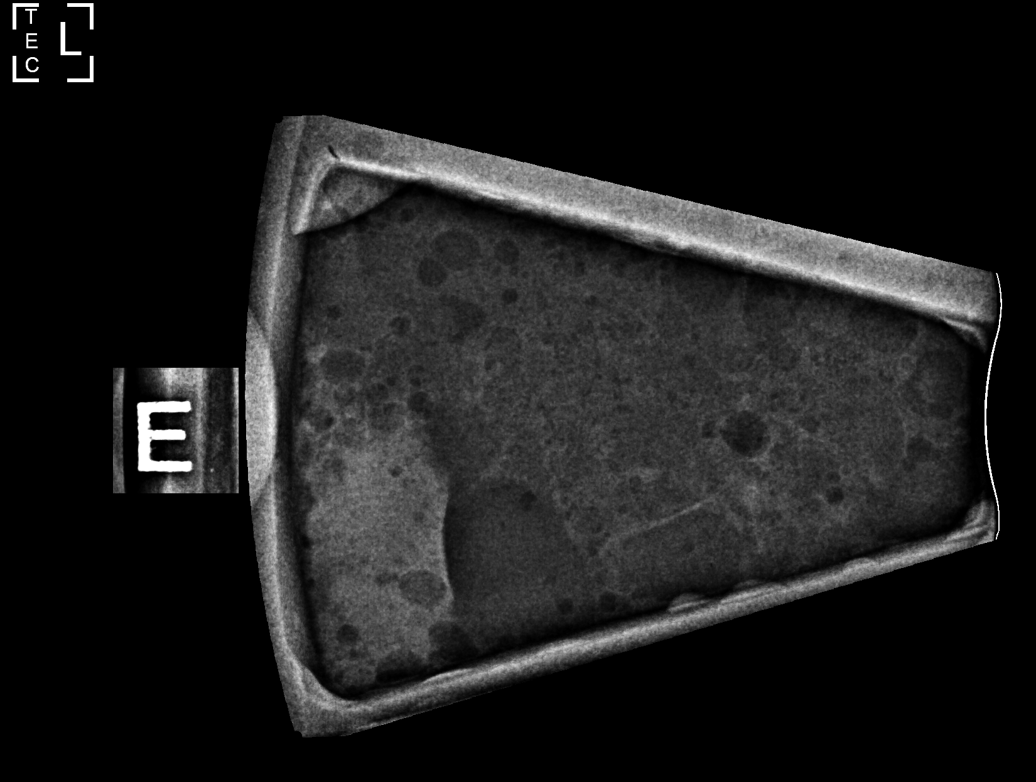

[L (6 of 6)]
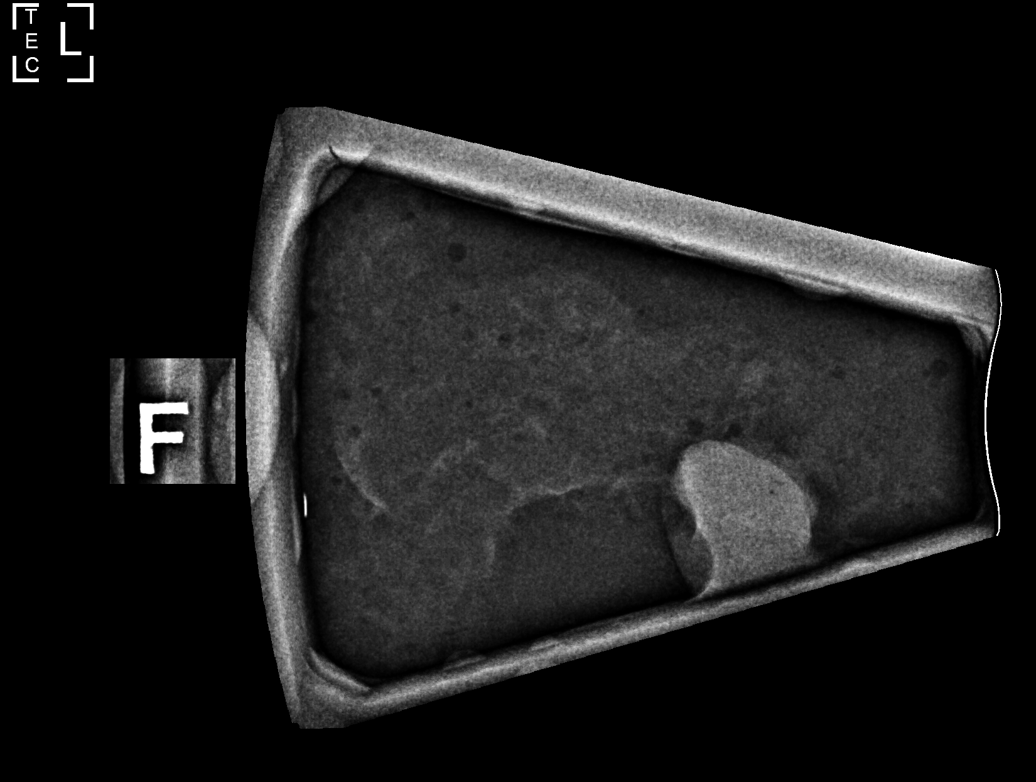

[6 of 21 positions shown; findings below may reference images not displayed]



Using sterile technique and 1% Lidocaine as local anesthetic, under
stereotactic guidance, a 9 gauge vacuum assisted device was used to
perform core needle biopsy of mass within the central left breast
using a cranial approach.

Lesion quadrant: Lower outer quadrant

At the conclusion of the procedure, X shaped tissue marker clip was
deployed into the biopsy cavity. Follow-up 2-view mammogram was
performed and dictated separately.
IMPRESSION: Stereotactic-guided biopsy of left breast mass. No apparent
complications.

ADDENDUM:
Pathology revealed INTRADUCTAL PAPILLOMA of the Left breast,
central. This was found to be concordant by Dr. Sarthak Borst, with
excision recommended.

Pathology results were discussed with the patient by telephone. The
patient reported doing well after the biopsy with tenderness at the
site. Post biopsy instructions and care were reviewed and questions
were answered. The patient was encouraged to call The [REDACTED]

Surgical consultation has been arranged with Dr. Bennett Pepe at
[REDACTED] on October 01, 2019.

Pathology results reported by Marie-Ketteline Tataille, RN on 09/10/2019.



Using sterile technique and 1% Lidocaine as local anesthetic, under
stereotactic guidance, a 9 gauge vacuum assisted device was used to
perform core needle biopsy of mass within the central left breast
using a cranial approach.

Lesion quadrant: Lower outer quadrant

At the conclusion of the procedure, X shaped tissue marker clip was
deployed into the biopsy cavity. Follow-up 2-view mammogram was
performed and dictated separately.
IMPRESSION: Stereotactic-guided biopsy of left breast mass. No apparent
complications.

## 2020-12-24 ENCOUNTER — Encounter (HOSPITAL_COMMUNITY): Payer: Self-pay

## 2020-12-26 ENCOUNTER — Other Ambulatory Visit: Payer: Self-pay | Admitting: Hematology and Oncology

## 2020-12-26 DIAGNOSIS — Z9189 Other specified personal risk factors, not elsewhere classified: Secondary | ICD-10-CM

## 2021-01-09 ENCOUNTER — Ambulatory Visit
Admission: RE | Admit: 2021-01-09 | Discharge: 2021-01-09 | Disposition: A | Discharge status: Home / Self Care | Payer: 59 | Source: Ambulatory Visit | Attending: Hematology and Oncology | Admitting: Hematology and Oncology

## 2021-01-09 ENCOUNTER — Other Ambulatory Visit: Payer: Self-pay

## 2021-01-09 DIAGNOSIS — Z9189 Other specified personal risk factors, not elsewhere classified: Secondary | ICD-10-CM

## 2021-01-09 MED ORDER — GADOBUTROL 1 MMOL/ML IV SOLN
8.0000 mL | Freq: Once | INTRAVENOUS | Status: AC | PRN
  Administered 2021-01-09: 8 mL via INTRAVENOUS

## 2021-03-02 NOTE — Progress Notes (Signed)
Patient Care Team: Lavella Lemons, PA as PCP - General (Physician Assistant)  DIAGNOSIS:    ICD-10-CM   1. Atypical ductal hyperplasia of left breast  N60.92         CHIEF COMPLIANT: Follow-up of high risk for breast cancer  INTERVAL HISTORY: [name] is a 65 y.o. with above-mentioned history of high risk for breast cancer currently on risk reduction therapy with tamoxifen. MRI Breast on 01/09/2021 showed no evidence of malignancy. She presents to the clinic today for follow-up.  Occasional hot flashes related to tamoxifen but otherwise tolerating it very well.  ALLERGIES:  is allergic to levaquin [levofloxacin in d5w] and sulfa antibiotics.  MEDICATIONS:  Current Outpatient Medications  Medication Sig Dispense Refill   albuterol (PROVENTIL HFA;VENTOLIN HFA) 108 (90 Base) MCG/ACT inhaler Inhale 2 puffs into the lungs as directed.     cyclobenzaprine (FLEXERIL) 10 MG tablet Take 1 tablet by mouth daily as needed for muscle spasms.     furosemide (LASIX) 40 MG tablet Take 1 tablet by mouth daily as needed.     hydrochlorothiazide (HYDRODIURIL) 25 MG tablet Take 25 mg by mouth daily.     hydrOXYzine (ATARAX/VISTARIL) 25 MG tablet Take 1 tablet (25 mg total) by mouth every 6 (six) hours as needed for itching. 20 tablet 0   meloxicam (MOBIC) 15 MG tablet Take 1 tablet by mouth daily as needed for pain.     metoprolol succinate (TOPROL-XL) 50 MG 24 hr tablet Take 1 tablet by mouth daily.     montelukast (SINGULAIR) 10 MG tablet Take 10 mg by mouth as directed.     Multiple Vitamin (MULTIVITAMIN) tablet Take 1 tablet by mouth daily.     predniSONE (STERAPRED UNI-PAK 21 TAB) 10 MG (21) TBPK tablet Dispense one 6 day pack. Take as directed with food. 21 tablet 0   tamoxifen (NOLVADEX) 20 MG tablet Take 1 tablet (20 mg total) by mouth daily. 90 tablet 3   zinc gluconate 50 MG tablet Take 50 mg by mouth daily.     No current facility-administered medications for this visit.     PHYSICAL EXAMINATION: ECOG PERFORMANCE STATUS: 1 - Symptomatic but completely ambulatory  Vitals:   03/03/21 0902  BP: (!) 136/58  Pulse: 72  Resp: 18  Temp: (!) 97.3 F (36.3 C)  SpO2: 100%   Filed Weights   03/03/21 0902  Weight: 166 lb 6.4 oz (75.5 kg)    BREAST: No palpable masses or nodules in either right or left breasts. No palpable axillary supraclavicular or infraclavicular adenopathy no breast tenderness or nipple discharge. (exam performed in the presence of a chaperone)  LABORATORY DATA:  I have reviewed the data as listed CMP Latest Ref Rng & Units 10/20/2019  Glucose 70 - 99 mg/dL 102(H)  BUN 8 - 23 mg/dL 12  Creatinine 0.44 - 1.00 mg/dL 0.76  Sodium 135 - 145 mmol/L 140  Potassium 3.5 - 5.1 mmol/L 4.2  Chloride 98 - 111 mmol/L 105  CO2 22 - 32 mmol/L 27  Calcium 8.9 - 10.3 mg/dL 9.2    No results found for: WBC, HGB, HCT, MCV, PLT, NEUTROABS  ASSESSMENT & PLAN:  Atypical ductal hyperplasia of left breast left lumpectomy on 10/23/19 for which pathology showed Intraductal papilloma with atypical ductal hyperplasia   Prognosis: Tyrer Cusick risk score: Lifetime risk 25%   Current treatment: Tamoxifen 20 mg daily started 11/27/2019 Tamoxifen toxicities: Occasional hot flashes but otherwise tolerating it well.   Breast  cancer surveillance:   1.  Mammograms 08/27/2020: Benign breast density category B 2.   breast MRI 01/09/2021: Benign, breast density category B Planning to do breast MRIs every other year. Return to clinic in 1 year for follow-up    No orders of the defined types were placed in this encounter.  The patient has a good understanding of the overall plan. she agrees with it. she will call with any problems that may develop before the next visit here.  Total time spent: 20 mins including face to face time and time spent for planning, charting and coordination of care  Rulon Eisenmenger, MD, MPH 03/03/2021  I, Thana Ates, am acting as  scribe for Dr. Nicholas Lose.  I have reviewed the above documentation for accuracy and completeness, and I agree with the above.

## 2021-03-03 ENCOUNTER — Inpatient Hospital Stay: Payer: 59 | Attending: Hematology and Oncology | Admitting: Hematology and Oncology

## 2021-03-03 ENCOUNTER — Other Ambulatory Visit: Payer: Self-pay

## 2021-03-03 DIAGNOSIS — Z79810 Long term (current) use of selective estrogen receptor modulators (SERMs): Secondary | ICD-10-CM | POA: Diagnosis not present

## 2021-03-03 DIAGNOSIS — N6092 Unspecified benign mammary dysplasia of left breast: Secondary | ICD-10-CM | POA: Diagnosis not present

## 2021-03-03 MED ORDER — TAMOXIFEN CITRATE 20 MG PO TABS: 20.0000 mg | ORAL_TABLET | Freq: Every day | ORAL | 3 refills | Status: DC

## 2021-03-03 NOTE — Assessment & Plan Note (Addendum)
left lumpectomy on 10/23/19 for which pathology showedIntraductal papilloma with atypical ductal hyperplasia  Prognosis: Tyrer Cusick risk score: Lifetime risk 25%  Current treatment: Tamoxifen 20 mg daily started 11/27/2019 Tamoxifen toxicities:  Breast cancer surveillance:   1.  Mammograms 08/27/2020: Benign breast density category B 2.   breast MRI 01/09/2021: Benign, breast density category B  Return to clinic in 1 year for follow-up

## 2021-08-08 ENCOUNTER — Other Ambulatory Visit: Payer: Self-pay

## 2021-08-08 ENCOUNTER — Ambulatory Visit
Admission: EM | Admit: 2021-08-08 | Discharge: 2021-08-08 | Disposition: A | Discharge status: Home / Self Care | Payer: 59

## 2021-08-08 DIAGNOSIS — J3089 Other allergic rhinitis: Secondary | ICD-10-CM | POA: Diagnosis not present

## 2021-08-08 DIAGNOSIS — J4521 Mild intermittent asthma with (acute) exacerbation: Secondary | ICD-10-CM

## 2021-08-08 MED ORDER — PROMETHAZINE-DM 6.25-15 MG/5ML PO SYRP: 5.0000 mL | ORAL_SOLUTION | Freq: Four times a day (QID) | ORAL | 0 refills | Status: DC | PRN

## 2021-08-08 MED ORDER — PREDNISONE 20 MG PO TABS: 40.0000 mg | ORAL_TABLET | Freq: Every day | ORAL | 0 refills | Status: DC

## 2021-08-08 NOTE — ED Provider Notes (Signed)
[facility]    CSN: [id number] Arrival date & time: 08/08/21  7829      History   Chief Complaint Chief Complaint  Patient presents with   Asthma    SOB and asthma issues    HPI ADALENA ABDULLA is a 66 y.o. female.   Presenting today with 3 to 4-day history of hacking dry cough, wheezing, chest tightness, muscle soreness from coughing.  She states that she has been using her albuterol inhaler for asthma and it has been providing short burst of relief but nothing lasting.  She now also having some postnasal drainage, scratchy throat but no significant congestion, fever, body aches, abdominal pain, nausea vomiting diarrhea, sick contacts.   Past Medical History:  Diagnosis Date   Asthma    Hypertension    PONV (postoperative nausea and vomiting)     Patient Active Problem List   Diagnosis Date Noted   At high risk for breast cancer 03/03/2020   Atypical ductal hyperplasia of left breast 11/27/2019   Essential hypertension 04/05/2017   Chest pain 03/06/2017   Family history of heart disease 03/06/2017    Past Surgical History:  Procedure Laterality Date   ABDOMINAL HYSTERECTOMY     BREAST EXCISIONAL BIOPSY     BREAST LUMPECTOMY WITH RADIOACTIVE SEED LOCALIZATION Left 10/23/2019   Procedure: LEFT BREAST LUMPECTOMY WITH RADIOACTIVE SEED LOCALIZATION;  Surgeon: Jovita Kussmaul, MD;  Location: [facility];  Service: General;  Laterality: Left;   [place]      OB History   No obstetric history on file.      Home Medications    Prior to Admission medications   Medication Sig Start Date End Date Taking? Authorizing Provider  predniSONE (DELTASONE) 20 MG tablet Take 2 tablets (40 mg total) by mouth daily with breakfast. 08/08/21  Yes Volney American, PA-C  promethazine-dextromethorphan (PROMETHAZINE-DM) 6.25-15 MG/5ML syrup Take 5 mLs by mouth 4 (four) times daily as needed. 08/08/21  Yes Volney American, PA-C  rosuvastatin (CRESTOR) 5 MG tablet Take 5 mg by mouth daily.   Yes [provider]  albuterol (PROVENTIL HFA;VENTOLIN HFA) 108 (90 Base) MCG/ACT inhaler Inhale 2 puffs into the lungs as directed.    [provider]  cyclobenzaprine (FLEXERIL) 10 MG tablet Take 1 tablet by mouth daily as needed for muscle spasms.    [provider]  furosemide (LASIX) 40 MG tablet Take 1 tablet by mouth daily as needed.    [provider]  hydrochlorothiazide (HYDRODIURIL) 25 MG tablet Take 25 mg by mouth daily.    [provider]  hydrOXYzine (ATARAX/VISTARIL) 25 MG tablet Take 1 tablet (25 mg total) by mouth every 6 (six) hours as needed for itching. 12/11/20   Melynda Ripple, MD  meloxicam (MOBIC) 15 MG tablet Take 1 tablet by mouth daily as needed for pain.    [provider]  metoprolol succinate (TOPROL-XL) 50 MG 24 hr tablet Take 1 tablet by mouth daily. 03/22/17   [provider]  montelukast (SINGULAIR) 10 MG tablet Take 10 mg by mouth as directed.    [provider]  Multiple Vitamin (MULTIVITAMIN) tablet Take 1 tablet by mouth daily.    [provider]  predniSONE (STERAPRED UNI-PAK 21 TAB) 10 MG (21) TBPK tablet Dispense one 6 day pack. Take as directed with food. 12/11/20   Melynda Ripple, MD  tamoxifen (NOLVADEX)  20 MG tablet Take 1 tablet (20 mg total) by mouth daily. 03/03/21   Nicholas Lose, MD  zinc gluconate 50 MG tablet Take 50 mg by mouth daily.    [provider]    Family History Family History  Problem Relation Age of Onset   Heart attack Mother    Diabetes Mother    Heart attack Father    Diabetes Father    Heart attack Brother    Anuerysm Maternal Grandmother    Heart attack Maternal Grandfather    Heart attack Paternal Grandmother    Heart attack Paternal Grandfather     Social History Social History   Tobacco Use   Smoking status: Never   Smokeless tobacco:  Never  Vaping Use   Vaping Use: Never used  Substance Use Topics   Alcohol use: Never   Drug use: Never     Allergies   Levaquin [levofloxacin in d5w] and Sulfa antibiotics   Review of Systems Review of Systems Per HPI  Physical Exam Triage Vital Signs ED Triage Vitals [08/08/21 1057]  Enc Vitals Group     BP 126/68     Pulse      Resp 18     Temp 98.1 F (36.7 C)     Temp Source Oral     SpO2 98 %     Weight      Height      Head Circumference      Peak Flow      Pain Score      Pain Loc      Pain Edu?      Excl. in [place]?    No data found.  Updated Vital Signs BP 126/68 (BP Location: Right Arm)    Temp 98.1 F (36.7 C) (Oral)    Resp 18    SpO2 98%   Visual Acuity Right Eye Distance:   Left Eye Distance:   Bilateral Distance:    Right Eye Near:   Left Eye Near:    Bilateral Near:     Physical Exam Vitals and nursing note reviewed.  Constitutional:      Appearance: Normal appearance.  HENT:     Head: Atraumatic.     Right Ear: Tympanic membrane and external ear normal.     Left Ear: Tympanic membrane and external ear normal.     Nose: Rhinorrhea present.     Mouth/Throat:     Mouth: Mucous membranes are moist.     Pharynx: Posterior oropharyngeal erythema present. No oropharyngeal exudate.  Eyes:     Extraocular Movements: Extraocular movements intact.     Conjunctiva/sclera: Conjunctivae normal.  Cardiovascular:     Rate and Rhythm: Normal rate and regular rhythm.     Heart sounds: Normal heart sounds.  Pulmonary:     Effort: Pulmonary effort is normal.     Breath sounds: Wheezing present. No rales.     Comments: Mild diffuse wheezes bilaterally Musculoskeletal:        General: Normal range of motion.     Cervical back: Normal range of motion and neck supple.  Skin:    General: Skin is warm and dry.  Neurological:     Mental Status: She is alert and oriented to person, place, and time.  Psychiatric:        Mood and Affect: Mood normal.         Thought Content: Thought content normal.   UC Treatments / Results  Labs (all labs ordered  are listed, but only abnormal results are displayed) Labs Reviewed - No data to display  EKG  Radiology No results found.  Procedures Procedures (including critical care time)  Medications Ordered in UC Medications - No data to display  Initial Impression / Assessment and Plan / UC Course  I have reviewed the triage vital signs and the nursing notes.  Pertinent labs & imaging results that were available during my care of the patient were reviewed by me and considered in my medical decision making (see chart for details).      Declines viral testing, suspect more allergic in nature causing an exacerbation of her asthma.  Treat with prednisone, albuterol as needed, Phenergan DM and supportive over-the-counter medications and home care.  Return for any acutely worsening symptoms.  Final Clinical Impressions(s) / UC Diagnoses   Final diagnoses:  Mild intermittent asthma with acute exacerbation  Seasonal allergic rhinitis due to other allergic trigger   Discharge Instructions   None    ED Prescriptions     Medication Sig Dispense Auth. Provider   predniSONE (DELTASONE) 20 MG tablet Take 2 tablets (40 mg total) by mouth daily with breakfast. 10 tablet Volney American, PA-C   promethazine-dextromethorphan (PROMETHAZINE-DM) 6.25-15 MG/5ML syrup Take 5 mLs by mouth 4 (four) times daily as needed. 100 mL Volney American, [place]      PDMP not reviewed this encounter.   Volney American, [place] 08/08/21 1147

## 2021-08-08 NOTE — ED Triage Notes (Signed)
Patient states that on Saturday night she had tightness in her chest  Patient states she has a cough with SOB  Patient states she has been using an inhaler without much relief  Patient states her back is hurting from coughing

## 2021-08-17 DIAGNOSIS — J4521 Mild intermittent asthma with (acute) exacerbation: Secondary | ICD-10-CM | POA: Diagnosis not present

## 2021-08-17 DIAGNOSIS — R059 Cough, unspecified: Secondary | ICD-10-CM | POA: Diagnosis not present

## 2021-08-29 ENCOUNTER — Other Ambulatory Visit (HOSPITAL_COMMUNITY): Payer: Self-pay | Admitting: Hematology and Oncology

## 2021-08-29 DIAGNOSIS — Z1231 Encounter for screening mammogram for malignant neoplasm of breast: Secondary | ICD-10-CM

## 2021-09-06 ENCOUNTER — Ambulatory Visit (HOSPITAL_COMMUNITY)
Admission: RE | Admit: 2021-09-06 | Discharge: 2021-09-06 | Disposition: A | Discharge status: Home / Self Care | Payer: 59 | Source: Ambulatory Visit | Attending: Hematology and Oncology | Admitting: Hematology and Oncology

## 2021-09-06 ENCOUNTER — Other Ambulatory Visit: Payer: Self-pay

## 2021-09-06 DIAGNOSIS — Z1231 Encounter for screening mammogram for malignant neoplasm of breast: Secondary | ICD-10-CM | POA: Diagnosis not present

## 2021-09-09 DIAGNOSIS — Z6829 Body mass index (BMI) 29.0-29.9, adult: Secondary | ICD-10-CM | POA: Diagnosis not present

## 2021-09-09 DIAGNOSIS — M654 Radial styloid tenosynovitis [de Quervain]: Secondary | ICD-10-CM | POA: Diagnosis not present

## 2021-09-20 DIAGNOSIS — M1811 Unilateral primary osteoarthritis of first carpometacarpal joint, right hand: Secondary | ICD-10-CM | POA: Diagnosis not present

## 2021-09-20 DIAGNOSIS — Z6829 Body mass index (BMI) 29.0-29.9, adult: Secondary | ICD-10-CM | POA: Diagnosis not present

## 2021-09-20 DIAGNOSIS — M19031 Primary osteoarthritis, right wrist: Secondary | ICD-10-CM | POA: Diagnosis not present

## 2021-10-18 DIAGNOSIS — Z13220 Encounter for screening for lipoid disorders: Secondary | ICD-10-CM | POA: Diagnosis not present

## 2021-10-18 DIAGNOSIS — I10 Essential (primary) hypertension: Secondary | ICD-10-CM | POA: Diagnosis not present

## 2021-10-23 DIAGNOSIS — M1711 Unilateral primary osteoarthritis, right knee: Secondary | ICD-10-CM | POA: Diagnosis not present

## 2021-10-23 DIAGNOSIS — I10 Essential (primary) hypertension: Secondary | ICD-10-CM | POA: Diagnosis not present

## 2021-10-23 DIAGNOSIS — J4520 Mild intermittent asthma, uncomplicated: Secondary | ICD-10-CM | POA: Diagnosis not present

## 2021-10-23 DIAGNOSIS — Z6829 Body mass index (BMI) 29.0-29.9, adult: Secondary | ICD-10-CM | POA: Diagnosis not present

## 2022-01-18 DIAGNOSIS — Z6829 Body mass index (BMI) 29.0-29.9, adult: Secondary | ICD-10-CM | POA: Diagnosis not present

## 2022-01-18 DIAGNOSIS — R030 Elevated blood-pressure reading, without diagnosis of hypertension: Secondary | ICD-10-CM | POA: Diagnosis not present

## 2022-01-18 DIAGNOSIS — R300 Dysuria: Secondary | ICD-10-CM | POA: Diagnosis not present

## 2022-02-27 NOTE — Progress Notes (Signed)
Patient Care Team: Lavella Lemons, PA as PCP - General (Physician Assistant)  DIAGNOSIS:  Encounter Diagnosis  Name Primary?   Atypical ductal hyperplasia of left breast Yes      CHIEF COMPLIANT: Follow-up of high risk for breast cancer on tamoxifen  INTERVAL HISTORY: Amanda Roberts is a  66 y.o. with above-mentioned history of high risk for breast cancer currently on risk reduction therapy with tamoxifen. She presents to the clinic today for a follow-up. She states she has manageable hot flashes but she is tolerating the tamoxifen. Denies any other side effects or symptoms. She does get time for exercise.   ALLERGIES:  is allergic to levaquin [levofloxacin in d5w] and sulfa antibiotics.  MEDICATIONS:  Current Outpatient Medications  Medication Sig Dispense Refill   albuterol (PROVENTIL HFA;VENTOLIN HFA) 108 (90 Base) MCG/ACT inhaler Inhale 2 puffs into the lungs as directed.     cyclobenzaprine (FLEXERIL) 10 MG tablet Take 1 tablet by mouth daily as needed for muscle spasms.     furosemide (LASIX) 40 MG tablet Take 1 tablet by mouth daily as needed.     hydrochlorothiazide (HYDRODIURIL) 25 MG tablet Take 25 mg by mouth daily.     meloxicam (MOBIC) 15 MG tablet Take 1 tablet by mouth daily as needed for pain.     metoprolol succinate (TOPROL-XL) 50 MG 24 hr tablet Take 1 tablet by mouth daily.     montelukast (SINGULAIR) 10 MG tablet Take 10 mg by mouth as directed.     Multiple Vitamin (MULTIVITAMIN) tablet Take 1 tablet by mouth daily.     nitrofurantoin, macrocrystal-monohydrate, (MACROBID) 100 MG capsule Take by mouth 2 (two) times daily as needed.     rosuvastatin (CRESTOR) 5 MG tablet Take 5 mg by mouth daily.     tamoxifen (NOLVADEX) 20 MG tablet Take 1 tablet (20 mg total) by mouth daily. 90 tablet 3   zinc gluconate 50 MG tablet Take 50 mg by mouth daily.     No current facility-administered medications for this visit.    PHYSICAL EXAMINATION: ECOG PERFORMANCE  STATUS: 1 - Symptomatic but completely ambulatory  Vitals:   03/05/22 0816  BP: 132/74  Pulse: 70  Resp: 18  Temp: 97.7 F (36.5 C)  SpO2: 98%   Filed Weights   03/05/22 0816  Weight: 161 lb 4.8 oz (73.2 kg)    BREAST: No palpable masses or nodules in either right or left breasts. No palpable axillary supraclavicular or infraclavicular adenopathy no breast tenderness or nipple discharge. (exam performed in the presence of a chaperone)  LABORATORY DATA:  I have reviewed the data as listed    Latest Ref Rng & Units 10/20/2019   11:30 AM  CMP  Glucose 70 - 99 mg/dL 102   BUN 8 - 23 mg/dL 12   Creatinine 0.44 - 1.00 mg/dL 0.76   Sodium 135 - 145 mmol/L 140   Potassium 3.5 - 5.1 mmol/L 4.2   Chloride 98 - 111 mmol/L 105   CO2 22 - 32 mmol/L 27   Calcium 8.9 - 10.3 mg/dL 9.2     No results found for: "WBC", "HGB", "HCT", "MCV", "PLT", "NEUTROABS"  ASSESSMENT & PLAN:  Atypical ductal hyperplasia of left breast left lumpectomy on 10/23/19 for which pathology showed Intraductal papilloma with atypical ductal hyperplasia   Prognosis: Tyrer Cusick risk score: Lifetime risk 25%   Current treatment: Tamoxifen 20 mg daily started 11/27/2019 Tamoxifen toxicities: Occasional hot flashes but otherwise tolerating it well.  Breast cancer surveillance:   1.  Mammograms  09/06/2021: Benign breast density category B 2.   breast MRI 01/09/2021: Benign, breast density category B Planning to do breast MRIs every other year.  Next year we will obtain an MRI. Return to clinic in 1 year for follow-up      Orders Placed This Encounter  Procedures   MM DIAG BREAST TOMO BILATERAL    Standing Status:   Future    Standing Expiration Date:   03/06/2023    Order Specific Question:   Reason for Exam (SYMPTOM  OR DIAGNOSIS REQUIRED)    Answer:   high risk    Order Specific Question:   Preferred imaging location?    Answer:   [facility]   The patient has a good understanding of the overall  plan. she agrees with it. she will call with any problems that may develop before the next visit here. Total time spent: 30 mins including face to face time and time spent for planning, charting and co-ordination of care   Harriette Ohara, MD 03/05/22    I Gardiner Coins am scribing for Dr. Lindi Adie  I have reviewed the above documentation for accuracy and completeness, and I agree with the above.

## 2022-03-05 ENCOUNTER — Other Ambulatory Visit: Payer: Self-pay

## 2022-03-05 ENCOUNTER — Inpatient Hospital Stay: Payer: 59 | Attending: Hematology and Oncology | Admitting: Hematology and Oncology

## 2022-03-05 VITALS — BP 132/74 | HR 70 | Temp 97.7°F | Resp 18 | Ht 63.0 in | Wt 161.3 lb

## 2022-03-05 DIAGNOSIS — Z79810 Long term (current) use of selective estrogen receptor modulators (SERMs): Secondary | ICD-10-CM | POA: Insufficient documentation

## 2022-03-05 DIAGNOSIS — R232 Flushing: Secondary | ICD-10-CM | POA: Diagnosis not present

## 2022-03-05 DIAGNOSIS — N6092 Unspecified benign mammary dysplasia of left breast: Secondary | ICD-10-CM

## 2022-03-05 MED ORDER — TAMOXIFEN CITRATE 20 MG PO TABS: 20.0000 mg | ORAL_TABLET | Freq: Every day | ORAL | 3 refills | Status: DC

## 2022-03-05 NOTE — Assessment & Plan Note (Signed)
left lumpectomy on 10/23/19 for which pathology showedIntraductal papilloma with atypical ductal hyperplasia  Prognosis:Tyrer Cusick risk score: Lifetime risk 25%  Current treatment: Tamoxifen 20 mg daily started 11/27/2019 Tamoxifen toxicities: Occasional hot flashes but otherwise tolerating it well.  Breast cancer surveillance: 1.Mammograms  09/06/2021: Benign breast density category B 2. breast MRI 01/09/2021: Benign, breast density category B Planning to do breast MRIs every other year.  Next year we will obtain an MRI. Return to clinic in 1 year for follow-up

## 2022-03-05 NOTE — Addendum Note (Signed)
Addended by: Gardiner Coins L on: 03/05/2022 10:04 AM   Modules accepted: Orders

## 2022-03-05 NOTE — Addendum Note (Signed)
Addended by: Suzzette Righter on: 03/05/2022 09:46 AM   Modules accepted: Orders

## 2022-03-12 ENCOUNTER — Other Ambulatory Visit: Payer: Self-pay | Admitting: Hematology and Oncology

## 2022-04-24 DIAGNOSIS — M1711 Unilateral primary osteoarthritis, right knee: Secondary | ICD-10-CM | POA: Diagnosis not present

## 2022-04-24 DIAGNOSIS — Z6828 Body mass index (BMI) 28.0-28.9, adult: Secondary | ICD-10-CM | POA: Diagnosis not present

## 2022-04-24 DIAGNOSIS — J4520 Mild intermittent asthma, uncomplicated: Secondary | ICD-10-CM | POA: Diagnosis not present

## 2022-04-24 DIAGNOSIS — I10 Essential (primary) hypertension: Secondary | ICD-10-CM | POA: Diagnosis not present

## 2022-05-31 DIAGNOSIS — R110 Nausea: Secondary | ICD-10-CM | POA: Diagnosis not present

## 2022-05-31 DIAGNOSIS — M545 Low back pain, unspecified: Secondary | ICD-10-CM | POA: Diagnosis not present

## 2022-05-31 DIAGNOSIS — Z20828 Contact with and (suspected) exposure to other viral communicable diseases: Secondary | ICD-10-CM | POA: Diagnosis not present

## 2022-05-31 DIAGNOSIS — R030 Elevated blood-pressure reading, without diagnosis of hypertension: Secondary | ICD-10-CM | POA: Diagnosis not present

## 2022-06-14 ENCOUNTER — Other Ambulatory Visit: Payer: Self-pay | Admitting: Hematology and Oncology

## 2022-07-03 DIAGNOSIS — R030 Elevated blood-pressure reading, without diagnosis of hypertension: Secondary | ICD-10-CM | POA: Diagnosis not present

## 2022-07-03 DIAGNOSIS — J209 Acute bronchitis, unspecified: Secondary | ICD-10-CM | POA: Diagnosis not present

## 2022-07-03 DIAGNOSIS — J069 Acute upper respiratory infection, unspecified: Secondary | ICD-10-CM | POA: Diagnosis not present

## 2022-07-13 ENCOUNTER — Other Ambulatory Visit: Payer: Self-pay | Admitting: Hematology and Oncology

## 2022-09-10 ENCOUNTER — Other Ambulatory Visit: Payer: Self-pay | Admitting: Hematology and Oncology

## 2022-09-21 ENCOUNTER — Other Ambulatory Visit (HOSPITAL_COMMUNITY): Payer: Self-pay | Admitting: Hematology and Oncology

## 2022-09-21 DIAGNOSIS — Z1231 Encounter for screening mammogram for malignant neoplasm of breast: Secondary | ICD-10-CM

## 2022-09-26 ENCOUNTER — Encounter (HOSPITAL_COMMUNITY): Payer: Self-pay

## 2022-09-26 ENCOUNTER — Ambulatory Visit (HOSPITAL_COMMUNITY)
Admission: RE | Admit: 2022-09-26 | Discharge: 2022-09-26 | Disposition: A | Discharge status: Home / Self Care | Payer: 59 | Source: Ambulatory Visit | Attending: Hematology and Oncology | Admitting: Hematology and Oncology

## 2022-09-26 DIAGNOSIS — Z1231 Encounter for screening mammogram for malignant neoplasm of breast: Secondary | ICD-10-CM | POA: Diagnosis not present

## 2022-10-29 DIAGNOSIS — Z6827 Body mass index (BMI) 27.0-27.9, adult: Secondary | ICD-10-CM | POA: Diagnosis not present

## 2022-10-29 DIAGNOSIS — J4520 Mild intermittent asthma, uncomplicated: Secondary | ICD-10-CM | POA: Diagnosis not present

## 2022-10-29 DIAGNOSIS — I10 Essential (primary) hypertension: Secondary | ICD-10-CM | POA: Diagnosis not present

## 2022-10-29 DIAGNOSIS — M1711 Unilateral primary osteoarthritis, right knee: Secondary | ICD-10-CM | POA: Diagnosis not present

## 2023-02-27 ENCOUNTER — Ambulatory Visit
Admission: RE | Admit: 2023-02-27 | Discharge: 2023-02-27 | Disposition: A | Discharge status: Home / Self Care | Payer: PPO | Source: Ambulatory Visit | Attending: Hematology and Oncology | Admitting: Hematology and Oncology

## 2023-02-27 DIAGNOSIS — Z1239 Encounter for other screening for malignant neoplasm of breast: Secondary | ICD-10-CM | POA: Diagnosis not present

## 2023-02-27 DIAGNOSIS — N6092 Unspecified benign mammary dysplasia of left breast: Secondary | ICD-10-CM

## 2023-02-27 MED ORDER — GADOPICLENOL 0.5 MMOL/ML IV SOLN
7.0000 mL | Freq: Once | INTRAVENOUS | Status: AC | PRN
  Administered 2023-02-27: 7 mL via INTRAVENOUS

## 2023-03-06 ENCOUNTER — Inpatient Hospital Stay: Payer: PPO | Attending: Hematology and Oncology | Admitting: Hematology and Oncology

## 2023-03-06 VITALS — BP 136/66 | HR 69 | Temp 97.8°F | Resp 18 | Ht 63.0 in | Wt 161.8 lb

## 2023-03-06 DIAGNOSIS — Z79810 Long term (current) use of selective estrogen receptor modulators (SERMs): Secondary | ICD-10-CM | POA: Diagnosis not present

## 2023-03-06 DIAGNOSIS — N6092 Unspecified benign mammary dysplasia of left breast: Secondary | ICD-10-CM | POA: Diagnosis not present

## 2023-03-06 DIAGNOSIS — R232 Flushing: Secondary | ICD-10-CM | POA: Diagnosis not present

## 2023-03-06 MED ORDER — TAMOXIFEN CITRATE 20 MG PO TABS: 20.0000 mg | ORAL_TABLET | Freq: Every day | ORAL | 3 refills | Status: DC

## 2023-03-06 NOTE — Progress Notes (Signed)
Patient Care Team: Lovey Newcomer, PA as PCP - General (Physician Assistant)  DIAGNOSIS:  Encounter Diagnosis  Name Primary?   Atypical ductal hyperplasia of left breast Yes      CHIEF COMPLIANT:   Discussed the use of AI scribe software for clinical note transcription with the patient, who gave verbal consent to proceed.  History of Present Illness   The patient, with a history of atypical bowel hyperplasia, has been on tamoxifen for three years. She reports no known side effects from the medication. She experiences mild hot flashes, which she describes as feeling warm, especially after showering. At night, she feels warm, but this does not bother her. She denies any muscle cramps or vaginal spotting. She has been keeping up with her mammograms and MRIs, alternating between the two. Her most recent MRI was done in March. She has not had any issues with the contrast used in these imaging procedures.         ALLERGIES:  is allergic to levaquin [levofloxacin in d5w] and sulfa antibiotics.  MEDICATIONS:  Current Outpatient Medications  Medication Sig Dispense Refill   albuterol (PROVENTIL HFA;VENTOLIN HFA) 108 (90 Base) MCG/ACT inhaler Inhale 2 puffs into the lungs as directed.     cyclobenzaprine (FLEXERIL) 10 MG tablet Take 1 tablet by mouth daily as needed for muscle spasms.     furosemide (LASIX) 40 MG tablet Take 1 tablet by mouth daily as needed.     hydrochlorothiazide (HYDRODIURIL) 25 MG tablet Take 25 mg by mouth daily.     metoprolol succinate (TOPROL-XL) 50 MG 24 hr tablet Take 1 tablet by mouth daily.     montelukast (SINGULAIR) 10 MG tablet Take 10 mg by mouth as directed.     Multiple Vitamin (MULTIVITAMIN) tablet Take 1 tablet by mouth daily.     nitrofurantoin, macrocrystal-monohydrate, (MACROBID) 100 MG capsule Take by mouth 2 (two) times daily as needed.     rosuvastatin (CRESTOR) 5 MG tablet Take 5 mg by mouth daily.     zinc gluconate 50 MG tablet Take 50 mg  by mouth daily.     tamoxifen (NOLVADEX) 20 MG tablet Take 1 tablet (20 mg total) by mouth daily. 90 tablet 3   No current facility-administered medications for this visit.    PHYSICAL EXAMINATION: ECOG PERFORMANCE STATUS: 1 - Symptomatic but completely ambulatory  Vitals:   03/06/23 0837  BP: 136/66  Pulse: 69  Resp: 18  Temp: 97.8 F (36.6 C)  SpO2: 99%   Filed Weights   03/06/23 0837  Weight: 161 lb 12.8 oz (73.4 kg)      LABORATORY DATA:  I have reviewed the data as listed    Latest Ref Rng & Units 10/20/2019   11:30 AM  CMP  Glucose 70 - 99 mg/dL 161   BUN 8 - 23 mg/dL 12   Creatinine 0.96 - 1.00 mg/dL 0.45   Sodium 409 - 811 mmol/L 140   Potassium 3.5 - 5.1 mmol/L 4.2   Chloride 98 - 111 mmol/L 105   CO2 22 - 32 mmol/L 27   Calcium 8.9 - 10.3 mg/dL 9.2     No results found for: "WBC", "HGB", "HCT", "MCV", "PLT", "NEUTROABS"  ASSESSMENT & PLAN:  Atypical ductal hyperplasia of left breast left lumpectomy on 10/23/19 for which pathology showed Intraductal papilloma with atypical ductal hyperplasia   Prognosis: Tyrer Cusick risk score: Lifetime risk 25%   Current treatment: Tamoxifen 20 mg daily started 11/27/2019 Tamoxifen toxicities: Occasional  hot flashes but otherwise tolerating it well.   Breast cancer surveillance:   1.  Mammograms 09/27/2022: Benign breast density category B 2.   breast MRI 02/27/2023: Benign, breast density category B I recommended that we do contrast-enhanced mammograms for surveillance starting next year.  This way we do not have to do breast MRIs.  The imaging quality and resolution of a contrast-enhanced mammogram is equivalent to an MRI.  Return to clinic in 1 year for follow-up ------------------------------------- Assessment and Plan    Atypical Ductal Hyperplasia On Tamoxifen for 3 years with 2 more years to go. Mild hot flashes, no muscle cramps or vaginal spotting. -Continue Tamoxifen, refill for 1 year.  Breast Cancer  Surveillance Alternating mammograms and MRIs. Last mammogram in April, recent MRI. Discussed transitioning to contrast-enhanced mammograms due to concerns about gadolinium accumulation from MRIs. -Next mammogram in April 2025 to be contrast-enhanced.    Follow-up in 1 year.          Orders Placed This Encounter  Procedures   MM 2D DIAG BILAT WITH CONTRAST BCG ONLY    Standing Status:   Future    Standing Expiration Date:   03/05/2024    Order Specific Question:   Reason for Exam (SYMPTOM  OR DIAGNOSIS REQUIRED)    Answer:   High risk breast cancer    Order Specific Question:   If indicated for the ordered procedure, I authorize the administration of contrast media per Radiology protocol    Answer:   Yes    Order Specific Question:   Does the patient have a contrast media/X-ray dye allergy?    Answer:   No    Order Specific Question:   Preferred Imaging Location?    Answer:   [facility]    Order Specific Question:   Release to patient    Answer:   Immediate   The patient has a good understanding of the overall plan. she agrees with it. she will call with any problems that may develop before the next visit here. Total time spent: 30 mins including face to face time and time spent for planning, charting and co-ordination of care   Tamsen Meek, MD 03/06/23

## 2023-03-06 NOTE — Assessment & Plan Note (Signed)
left lumpectomy on 10/23/19 for which pathology showed Intraductal papilloma with atypical ductal hyperplasia   Prognosis: Tyrer Cusick risk score: Lifetime risk 25%   Current treatment: Tamoxifen 20 mg daily started 11/27/2019 Tamoxifen toxicities: Occasional hot flashes but otherwise tolerating it well.   Breast cancer surveillance:   1.  Mammograms 09/27/2022: Benign breast density category B 2.   breast MRI 02/27/2023: Benign, breast density category B Planning to do breast MRIs every other year.  Return to clinic in 1 year for follow-up

## 2023-04-15 DIAGNOSIS — Z13220 Encounter for screening for lipoid disorders: Secondary | ICD-10-CM | POA: Diagnosis not present

## 2023-04-15 DIAGNOSIS — I10 Essential (primary) hypertension: Secondary | ICD-10-CM | POA: Diagnosis not present

## 2023-04-15 DIAGNOSIS — Z131 Encounter for screening for diabetes mellitus: Secondary | ICD-10-CM | POA: Diagnosis not present

## 2023-04-22 DIAGNOSIS — I10 Essential (primary) hypertension: Secondary | ICD-10-CM | POA: Diagnosis not present

## 2023-04-22 DIAGNOSIS — Z1389 Encounter for screening for other disorder: Secondary | ICD-10-CM | POA: Diagnosis not present

## 2023-04-22 DIAGNOSIS — J4520 Mild intermittent asthma, uncomplicated: Secondary | ICD-10-CM | POA: Diagnosis not present

## 2023-04-22 DIAGNOSIS — Z6828 Body mass index (BMI) 28.0-28.9, adult: Secondary | ICD-10-CM | POA: Diagnosis not present

## 2023-04-22 DIAGNOSIS — M1711 Unilateral primary osteoarthritis, right knee: Secondary | ICD-10-CM | POA: Diagnosis not present

## 2023-04-22 DIAGNOSIS — Z1331 Encounter for screening for depression: Secondary | ICD-10-CM | POA: Diagnosis not present

## 2023-06-21 DIAGNOSIS — M25562 Pain in left knee: Secondary | ICD-10-CM | POA: Diagnosis not present

## 2023-06-21 DIAGNOSIS — M25561 Pain in right knee: Secondary | ICD-10-CM | POA: Diagnosis not present

## 2023-06-26 ENCOUNTER — Ambulatory Visit (HOSPITAL_COMMUNITY): Payer: PPO | Attending: Orthopedic Surgery

## 2023-06-26 ENCOUNTER — Other Ambulatory Visit: Payer: Self-pay

## 2023-06-26 DIAGNOSIS — M6281 Muscle weakness (generalized): Secondary | ICD-10-CM | POA: Insufficient documentation

## 2023-06-26 DIAGNOSIS — M25561 Pain in right knee: Secondary | ICD-10-CM | POA: Insufficient documentation

## 2023-06-26 DIAGNOSIS — R262 Difficulty in walking, not elsewhere classified: Secondary | ICD-10-CM | POA: Insufficient documentation

## 2023-06-26 NOTE — Therapy (Signed)
 [facility] OUT[facility]IENT PHYSICAL THERAPY EVALUATION (LOWER EXTREMITY)   Patient Name: Amanda Roberts MRN: [id number] DOB:1955-10-05, 68 y.o., female Today's Date: 06/26/2023  END OF SESSION:   PT End of Session - 06/26/23 1423     Visit Number 1    Number of Visits 8    Authorization Type [employer]    Authorization Time Period no auth; no limit    Progress Note Due on Visit 10    PT Start Time 0220    PT Stop Time 0305    PT Time Calculation (min) 45 min    Activity Tolerance Patient tolerated treatment well    Behavior During Therapy WFL for tasks assessed/performed              Past Medical History:  Diagnosis Date   Asthma    Hypertension    PONV (postoperative nausea and vomiting)    Past Surgical History:  Procedure Laterality Date   ABDOMINAL HYSTERECTOMY     BREAST EXCISIONAL BIOPSY     BREAST LUMPECTOMY WITH RADIOACTIVE SEED LOCALIZATION Left 10/23/2019   Procedure: LEFT BREAST LUMPECTOMY WITH RADIOACTIVE SEED LOCALIZATION;  Surgeon: Curvin Deward MOULD, MD;  Location:  [facility];  Service: General;  Laterality: Left;   CESAREAN SECTION     DG GALL BLADDER     FOOT SURGERY     Patient Active Problem List   Diagnosis Date Noted   At high risk for breast cancer 03/03/2020   Atypical ductal hyperplasia of left breast 11/27/2019   Essential hypertension 04/05/2017   Chest pain 03/06/2017   Family history of heart disease 03/06/2017    PCP: Jolee Elsie RAMAN, PAPCP - General   REFERRING PROVIDER: Beverley Evalene BIRCH, MDRef Provider   REFERRING DIAG: bilateral knee OA   Rationale for Evaluation and Treatment: Rehabilitation  THERAPY DIAG:  Right knee pain, unspecified chronicity  Muscle weakness (generalized)  Difficulty in walking, not elsewhere classified  ONSET DATE: ~ few years    SUBJECTIVE:                                                                                                                                                                                            SUBJECTIVE STATEMENT: Patient reports that over the last few years, right > left knee have been in pain with on/off swelling. This continued until patient went to [facility] last Friday and got right knee cortisone shot and was referred to OPPT.  PERTINENT HISTORY:  Left foot surgery ~10 years ago   PAIN:  Are you having pain? Yes: NPRS scale: 8/10 but right now 0/10 Pain location: right  knee Pain description: sharp Aggravating factors: varies; can be any physical activity Relieving factors: rest  PRECAUTIONS: None  RED FLAGS: None   WEIGHT BEARING RESTRICTIONS: No  FALLS:  Has patient fallen in last 6 months? No  LIVING ENVIRONMENT: Lives with: lives with their spouse Lives in: House/apartment Stairs: Yes: External: 3 steps; can reach both Has following equipment at home: None   OCCUPATION: insurance; sedentary/ part-time  PLOF: Independent  PATIENT GOALS: To walk pain free   NEXT [place] VISIT: Feb 14th     OBJECTIVE:   DIAGNOSTIC FINDINGS:  Pending MRI   PATIENT SURVEYS:  LEFS Lower Extremity Functional Score: 41 / 80 = 51.2 %  SCREENING FOR RED FLAGS: Bowel or bladder incontinence: No Spinal tumors: No Cauda equina syndrome: No Compression fracture: No Abdominal aneurysm: No  COGNITION: Overall cognitive status: Within functional limits for tasks assessed  POSTURE: No Significant postural limitations      FUNCTIONAL TESTS:  5 times sit to stand: 16.33s with minimal pushing off legs with upper extremity  20-29 years: 6.0  1.4 seconds 30-39 years: 6.1  1.4 seconds 40-49 years: 7.6  1.8 seconds 50-59 years: 7.7  2.6 seconds 60-69 years: 8.4  0.0 seconds (female), 12.7  1.8 seconds (female) 70-79 years: 11.6  3.4 seconds (female), 13.0  4.8 seconds (female) 80-89 years: 16.7  4.5 seconds (female), 17.2  5.5 seconds (female) 90+ years: 19.5  2.3 seconds (female), 22.9  9.6 seconds (female)  The Minimal Clinically  Important Difference (MCID) is 2.3 seconds.  A score of 16 seconds or less indicates that the participant is not likely to fall.  A score of more than 16 seconds indicates a higher risk of falls  As per American Physical Therapy Association (APTA) website    GAIT ANALYSIS: Distance walked: 25 feet   Assistive device utilized: None Level of assistance: Complete Independence Comments: minimal antalgia on right lower extremity   SENSATION: WFL    LOWER EXTREMITY MMT:    MMT Right eval Left eval  Hip flexion 3+/5 3+/5  Hip extension    Hip abduction    Hip adduction    Hip internal rotation    Hip external rotation    Knee flexion 3+/5 4-/5  Knee extension 3+/5 4-/5  Ankle dorsiflexion    Ankle plantarflexion    Ankle inversion 3+/5   Ankle eversion 3/5    (Blank rows = not tested)  LOWER EXTREMITY ROM:     Active  Right eval Left eval  Hip flexion    Hip extension    Hip abduction    Hip adduction    Hip internal rotation    Hip external rotation    Knee flexion WFL   Knee extension WFL   Ankle dorsiflexion    Ankle plantarflexion    Ankle inversion    Ankle eversion 0-15    (Blank rows = not tested)  LOWER EXTREMITY SPECIAL TESTS Anterior drawer test: negative and Posterior drawer test: negative     PALPATION: Minimal tenderness to palpation to right patella; mobility GOOD Moderate swelling lateral right ankle     TODAY'S TREATMENT:  DATE:   06/26/23 PT I.E. HEP assigned- see below   PATIENT EDUCATION:  Education details: HEP  Person educated: Patient Education method: Explanation Education comprehension: verbalized understanding  HOME EXERCISE PROGRAM: Access Code: [email or web address] URL: [email or web address] Date: 06/26/2023 Prepared by: Deward Ming  Exercises - Active Straight Leg Raise with Quad Set   - 2 x daily - 2 sets - 10 reps  Patient Education - Knee Osteoarthritis    ASSESSMENT:  CLINICAL IMPRESSION: Patient is a 68 y.o. y.o. female who was seen today for physical therapy evaluation and treatment for right > left knee pain, weakness, troubles walking . Patient presents to PT with the following objective impairments: Abnormal gait, decreased activity tolerance, decreased endurance, decreased mobility, difficulty walking, decreased strength, increased edema, postural dysfunction, and pain. These impairments limit the patient in activities such as carrying, lifting, sitting, standing, squatting, stairs, hygiene/grooming, and locomotion level. These impairments also limit the patient in participation such as meal prep, cleaning, laundry, medication management, personal finances, driving, occupation, and yard work. The patient will benefit from PT to address the limitations/impairments listed below to return to their prior level of function in the domains of activity and participation.    PERSONAL FACTORS:  n/a  are also affecting patient's functional outcome.   REHAB POTENTIAL: Good  CLINICAL DECISION MAKING: Stable/uncomplicated  EVALUATION COMPLEXITY: Low     GOALS: Goals reviewed with patient? No  SHORT TERM GOALS: Target date: 07/17/2023    1. Patient will be independent with a basic stretching/strengthening HEP  Baseline:  Goal status: INITIAL   LONG TERM GOALS: Target date: 08/07/2023    Patient will score a >/= 50/80 on the LEFS    to demonstrate a Minimally Clinically Importance Difference (MCID) in ADL completion, home/community ambulation, and lifting/bending/squatting. Baseline:  Goal status: INITIAL  2.  Patient will complete the 5 times sit to stand:    within 14s to demonstrate an improvement in ADL completion, stair negotiation, household/community ambulation, and self-care Baseline:  Goal status: INITIAL  3.  Patient will be independent with a  comprehensive strengthening HEP  Baseline:  Goal status: INITIAL  4. Patient will be able to demonstrate right ankle eversion  active range of motion to 0-20 degrees to facilitate ADL completion, lifting, squatting. Baseline:  Goal status: INITIAL    PLAN:  PT FREQUENCY: 1-2x/week  PT DURATION: 6 weeks  PLANNED INTERVENTIONS: 97110-Therapeutic exercises, 97530- Therapeutic activity, [id number]- Neuromuscular re-education, 97535- Self Care, [id number]- Manual therapy, [phone]- Gait training, 97014- Electrical stimulation (unattended), [phone]- Electrical stimulation (manual), Patient/Family education, Balance training, Stair training, Taping, Dry Needling, Joint mobilization, Joint manipulation, Spinal manipulation, Spinal mobilization, Cryotherapy, and Moist heat.  PLAN FOR NEXT SESSION: Progress right knee/ right ankle resistance active range of motion; progress lower extremity strength    Deward Ming, PT 06/26/2023, 3:14 PM

## 2023-07-02 ENCOUNTER — Ambulatory Visit (HOSPITAL_COMMUNITY): Payer: PPO

## 2023-07-02 DIAGNOSIS — R262 Difficulty in walking, not elsewhere classified: Secondary | ICD-10-CM

## 2023-07-02 DIAGNOSIS — M25561 Pain in right knee: Secondary | ICD-10-CM

## 2023-07-02 DIAGNOSIS — M6281 Muscle weakness (generalized): Secondary | ICD-10-CM

## 2023-07-02 NOTE — Therapy (Signed)
 [facility] PHYSICAL THERAPY EVALUATION (LOWER EXTREMITY)   Patient Name: Amanda Roberts MRN: [id number] DOB:30-Apr-1956, 68 y.o., female Today's Date: 07/02/2023  END OF SESSION:   PT End of Session - 07/02/23 0844     Visit Number 2    Number of Visits 8    Authorization Type [employer]    Authorization Time Period no auth; no limit    Progress Note Due on Visit 10    PT Start Time 0845    PT Stop Time 0925    PT Time Calculation (min) 40 min    Activity Tolerance Patient tolerated treatment well    Behavior During Therapy WFL for tasks assessed/performed              Past Medical History:  Diagnosis Date   Asthma    Hypertension    PONV (postoperative nausea and vomiting)    Past Surgical History:  Procedure Laterality Date   ABDOMINAL HYSTERECTOMY     BREAST EXCISIONAL BIOPSY     BREAST LUMPECTOMY WITH RADIOACTIVE SEED LOCALIZATION Left 10/23/2019   Procedure: LEFT BREAST LUMPECTOMY WITH RADIOACTIVE SEED LOCALIZATION;  Surgeon: Curvin Deward MOULD, MD;  Location: [facility];  Service: General;  Laterality: Left;   CESAREAN SECTION     DG GALL BLADDER     FOOT SURGERY     Patient Active Problem List   Diagnosis Date Noted   At high risk for breast cancer 03/03/2020   Atypical ductal hyperplasia of left breast 11/27/2019   Essential hypertension 04/05/2017   Chest pain 03/06/2017   Family history of heart disease 03/06/2017    PCP: Jolee Elsie RAMAN, PAPCP - General   REFERRING PROVIDER: Beverley Evalene BIRCH, MDRef Provider   REFERRING DIAG: bilateral knee OA   Rationale for Evaluation and Treatment: Rehabilitation  THERAPY DIAG:  Right knee pain, unspecified chronicity  Muscle weakness (generalized)  Difficulty in walking, not elsewhere classified  ONSET DATE: ~ few years    SUBJECTIVE:                                                                                                                                                                                            SUBJECTIVE STATEMENT: Today: Patient reports 4/10 soreness in knee today   Eval: Patient reports that over the last few years, right > left knee have been in pain with on/off swelling. This continued until patient went to [facility] last Friday and got right knee cortisone shot and was referred to OPPT.  PERTINENT HISTORY:  Left foot surgery ~10 years ago   PAIN:  Are you having pain?  Yes: NPRS scale: 8/10 but right now 0/10 Pain location: right knee Pain description: sharp Aggravating factors: varies; can be any physical activity Relieving factors: rest  PRECAUTIONS: None  RED FLAGS: None   WEIGHT BEARING RESTRICTIONS: No  FALLS:  Has patient fallen in last 6 months? No  LIVING ENVIRONMENT: Lives with: lives with their spouse Lives in: House/apartment Stairs: Yes: External: 3 steps; can reach both Has following equipment at home: None   OCCUPATION: insurance; sedentary/ part-time  PLOF: Independent  PATIENT GOALS: To walk pain free   NEXT MD VISIT: Feb 14th     OBJECTIVE:   DIAGNOSTIC FINDINGS:  Pending MRI   PATIENT SURVEYS:  LEFS Lower Extremity Functional Score: 41 / 80 = 51.2 %  SCREENING FOR RED FLAGS: Bowel or bladder incontinence: No Spinal tumors: No Cauda equina syndrome: No Compression fracture: No Abdominal aneurysm: No  COGNITION: Overall cognitive status: Within functional limits for tasks assessed  POSTURE: No Significant postural limitations      FUNCTIONAL TESTS:  5 times sit to stand: 16.33s with minimal pushing off legs with upper extremity  20-29 years: 6.0  1.4 seconds 30-39 years: 6.1  1.4 seconds 40-49 years: 7.6  1.8 seconds 50-59 years: 7.7  2.6 seconds 60-69 years: 8.4  0.0 seconds (female), 12.7  1.8 seconds (female) 70-79 years: 11.6  3.4 seconds (female), 13.0  4.8 seconds (female) 80-89 years: 16.7  4.5 seconds (female), 17.2  5.5 seconds (female) 90+ years: 19.5  2.3 seconds  (female), 22.9  9.6 seconds (female)  The Minimal Clinically Important Difference (MCID) is 2.3 seconds.  A score of 16 seconds or less indicates that the participant is not likely to fall.  A score of more than 16 seconds indicates a higher risk of falls  As per American Physical Therapy Association (APTA) website    GAIT ANALYSIS: Distance walked: 25 feet   Assistive device utilized: None Level of assistance: Complete Independence Comments: minimal antalgia on right lower extremity   SENSATION: WFL    LOWER EXTREMITY MMT:    MMT Right eval Left eval  Hip flexion 3+/5 3+/5  Hip extension    Hip abduction    Hip adduction    Hip internal rotation    Hip external rotation    Knee flexion 3+/5 4-/5  Knee extension 3+/5 4-/5  Ankle dorsiflexion    Ankle plantarflexion    Ankle inversion 3+/5   Ankle eversion 3/5    (Blank rows = not tested)  LOWER EXTREMITY ROM:     Active  Right eval Left eval  Hip flexion    Hip extension    Hip abduction    Hip adduction    Hip internal rotation    Hip external rotation    Knee flexion WFL   Knee extension WFL   Ankle dorsiflexion    Ankle plantarflexion    Ankle inversion    Ankle eversion 0-15    (Blank rows = not tested)  LOWER EXTREMITY SPECIAL TESTS Anterior drawer test: negative and Posterior drawer test: negative     PALPATION: Minimal tenderness to palpation to right patella; mobility GOOD Moderate swelling lateral right ankle     TODAY'S TREATMENT:  DATE:   07/02/23 Supine  Manual therapy to right knee: soft tissue mobilization near patella, medial/lateral aspects, ankle right lower extremity for swelling management SAQs + Russian E Stim @ 20 MA, 4:12, 20% suty cycle to VMO- not tolerated   Seated  Single leg press right lower extremity with 30# 3x10  LAQs with 5#  2x10  06/26/23 PT I.E. HEP assigned- see below   PATIENT EDUCATION:  Education details: HEP  Person educated: Patient Education method: Explanation Education comprehension: verbalized understanding  HOME EXERCISE PROGRAM: Access Code: [email or web address] URL: [email or web address] Date: 06/26/2023 Prepared by: Deward Ming  Exercises - Active Straight Leg Raise with Quad Set  - 2 x daily - 2 sets - 10 reps  Patient Education - Knee Osteoarthritis    ASSESSMENT:  CLINICAL IMPRESSION: Today: Patient received in waiting room today. PT initiated lower extremity strengthening/ active range of motion today. Patient reports of chronic right lower extremity swelling from knee down to ankle. PT attempted to address right lower extremity swelling with manual therapy with minimal relief of swelling. PT then attempted SAQs; patient with decreased right VMO activation during knee extension. PT attempted to use Russian E Stim to help activate right VMO but unable to tolerate intensity of the E Stim. Pt then proceeded to use lower extremity strengthening. Patient able to tolerate well with no jincrease in pain.  Patient will benefit from PT to address the limitations/impairments listed below to return to their prior level of function in the domains of activity and participation.   Eval: Patient is a 68 y.o. y.o. female who was seen today for physical therapy evaluation and treatment for right > left knee pain, weakness, troubles walking . Patient presents to PT with the following objective impairments: Abnormal gait, decreased activity tolerance, decreased endurance, decreased mobility, difficulty walking, decreased strength, increased edema, postural dysfunction, and pain. These impairments limit the patient in activities such as carrying, lifting, sitting, standing, squatting, stairs, hygiene/grooming, and locomotion level. These impairments also limit the patient in participation such as meal  prep, cleaning, laundry, medication management, personal finances, driving, occupation, and yard work. The patient will benefit from PT to address the limitations/impairments listed below to return to their prior level of function in the domains of activity and participation.    PERSONAL FACTORS:  n/a  are also affecting patient's functional outcome.   REHAB POTENTIAL: Good  CLINICAL DECISION MAKING: Stable/uncomplicated  EVALUATION COMPLEXITY: Low     GOALS: Goals reviewed with patient? No  SHORT TERM GOALS: Target date: 07/17/2023    1. Patient will be independent with a basic stretching/strengthening HEP  Baseline:  Goal status: INITIAL   LONG TERM GOALS: Target date: 08/07/2023    Patient will score a >/= 50/80 on the LEFS    to demonstrate a Minimally Clinically Importance Difference (MCID) in ADL completion, home/community ambulation, and lifting/bending/squatting. Baseline:  Goal status: INITIAL  2.  Patient will complete the 5 times sit to stand:    within 14s to demonstrate an improvement in ADL completion, stair negotiation, household/community ambulation, and self-care Baseline:  Goal status: INITIAL  3.  Patient will be independent with a comprehensive strengthening HEP  Baseline:  Goal status: INITIAL  4. Patient will be able to demonstrate right ankle eversion  active range of motion to 0-20 degrees to facilitate ADL completion, lifting, squatting. Baseline:  Goal status: INITIAL    PLAN:  PT FREQUENCY: 1-2x/week  PT DURATION: 6 weeks  PLANNED INTERVENTIONS: 97110-Therapeutic exercises, 97530- Therapeutic activity,  02887- Neuromuscular re-education, [phone]- Self Care, 02859- Manual therapy, [id number]- Gait training, [phone]- Electrical stimulation (unattended), [phone]- Electrical stimulation (manual), Patient/Family education, Balance training, Stair training, Taping, Dry Needling, Joint mobilization, Joint manipulation, Spinal manipulation, Spinal  mobilization, Cryotherapy, and Moist heat.  PLAN FOR NEXT SESSION: Progress right knee/ right ankle resistance active range of motion; progress lower extremity strength    Deward Ming, PT 07/02/2023, 8:44 AM

## 2023-07-04 ENCOUNTER — Ambulatory Visit (HOSPITAL_COMMUNITY): Payer: PPO

## 2023-07-04 DIAGNOSIS — M25561 Pain in right knee: Secondary | ICD-10-CM | POA: Diagnosis not present

## 2023-07-04 DIAGNOSIS — M6281 Muscle weakness (generalized): Secondary | ICD-10-CM

## 2023-07-04 DIAGNOSIS — R262 Difficulty in walking, not elsewhere classified: Secondary | ICD-10-CM

## 2023-07-04 NOTE — Therapy (Signed)
[facility] OUTPATIENT PHYSICAL THERAPY EVALUATION (LOWER EXTREMITY)   Patient Name: Amanda Roberts MRN: [id number] DOB:[birth year], 68 y.o., female Today's Date: 07/04/2023  END OF SESSION:   PT End of Session - 07/04/23 1439     Visit Number 3    Number of Visits 8    Authorization Type [employer]    Authorization Time Period no auth; no limit    Progress Note Due on Visit 10    PT Start Time 0237    PT Stop Time 0315    PT Time Calculation (min) 38 min    Activity Tolerance Patient tolerated treatment well    Behavior During Therapy WFL for tasks assessed/performed              Past Medical History:  Diagnosis Date   Asthma    Hypertension    PONV (postoperative nausea and vomiting)    Past Surgical History:  Procedure Laterality Date   ABDOMINAL HYSTERECTOMY     BREAST EXCISIONAL BIOPSY     BREAST LUMPECTOMY WITH RADIOACTIVE SEED LOCALIZATION Left 10/23/2019   Procedure: LEFT BREAST LUMPECTOMY WITH RADIOACTIVE SEED LOCALIZATION;  Surgeon: Griselda Miner, MD;  Location: [facility];  Service: General;  Laterality: Left;   CESAREAN SECTION     DG GALL BLADDER     FOOT SURGERY     Patient Active Problem List   Diagnosis Date Noted   At high risk for breast cancer 03/03/2020   Atypical ductal hyperplasia of left breast 11/27/2019   Essential hypertension 04/05/2017   Chest pain 03/06/2017   Family history of heart disease 03/06/2017    PCP: Lovey Newcomer, PAPCP - General   REFERRING PROVIDER: Sheral Apley, MDRef Provider   REFERRING DIAG: bilateral knee OA   Rationale for Evaluation and Treatment: Rehabilitation  THERAPY DIAG:  Right knee pain, unspecified chronicity  Muscle weakness (generalized)  Difficulty in walking, not elsewhere classified  ONSET DATE: ~ few years    SUBJECTIVE:                                                                                                                                                                                            SUBJECTIVE STATEMENT: Today: Patient reports 0/10 right knee pain; was sore from last PT session    Eval: Patient reports that over the last few years, right > left knee have been in pain with on/off swelling. This continued until patient went to [place] last Friday and got right knee cortisone shot and was referred to OPPT.  PERTINENT HISTORY:  Left foot surgery ~10 years ago  PAIN:  Are you having pain? Yes: NPRS scale: 8/10 but right now 0/10 Pain location: right knee Pain description: sharp Aggravating factors: varies; can be any physical activity Relieving factors: rest  PRECAUTIONS: None  RED FLAGS: None   WEIGHT BEARING RESTRICTIONS: No  FALLS:  Has patient fallen in last 6 months? No  LIVING ENVIRONMENT: Lives with: lives with their spouse Lives in: House/apartment Stairs: Yes: External: 3 steps; can reach both Has following equipment at home: None   OCCUPATION: insurance; sedentary/ part-time  PLOF: Independent  PATIENT GOALS: To walk pain free   NEXT MD VISIT: Feb 14th     OBJECTIVE:   DIAGNOSTIC FINDINGS:  Pending MRI   PATIENT SURVEYS:  LEFS Lower Extremity Functional Score: 41 / 80 = 51.2 %  SCREENING FOR RED FLAGS: Bowel or bladder incontinence: No Spinal tumors: No Cauda equina syndrome: No Compression fracture: No Abdominal aneurysm: No  COGNITION: Overall cognitive status: Within functional limits for tasks assessed  POSTURE: No Significant postural limitations      FUNCTIONAL TESTS:  5 times sit to stand: 16.33s with minimal pushing off legs with upper extremity  20-29 years: 6.0  1.4 seconds 30-39 years: 6.1  1.4 seconds 40-49 years: 7.6  1.8 seconds 50-59 years: 7.7  2.6 seconds 60-69 years: 8.4  0.0 seconds (female), 12.7  1.8 seconds (female) 70-79 years: 11.6  3.4 seconds (female), 13.0  4.8 seconds (female) 80-89 years: 16.7  4.5 seconds (female), 17.2  5.5 seconds (female) 90+  years: 19.5  2.3 seconds (female), 22.9  9.6 seconds (female)  The Minimal Clinically Important Difference (MCID) is 2.3 seconds.  A score of 16 seconds or less indicates that the participant is not likely to fall.  A score of more than 16 seconds indicates a higher risk of falls  As per American Physical Therapy Association (APTA) website    GAIT ANALYSIS: Distance walked: 25 feet   Assistive device utilized: None Level of assistance: Complete Independence Comments: minimal antalgia on right lower extremity   SENSATION: WFL    LOWER EXTREMITY MMT:    MMT Right eval Left eval  Hip flexion 3+/5 3+/5  Hip extension    Hip abduction    Hip adduction    Hip internal rotation    Hip external rotation    Knee flexion 3+/5 4-/5  Knee extension 3+/5 4-/5  Ankle dorsiflexion    Ankle plantarflexion    Ankle inversion 3+/5   Ankle eversion 3/5    (Blank rows = not tested)  LOWER EXTREMITY ROM:     Active  Right eval Left eval  Hip flexion    Hip extension    Hip abduction    Hip adduction    Hip internal rotation    Hip external rotation    Knee flexion WFL   Knee extension WFL   Ankle dorsiflexion    Ankle plantarflexion    Ankle inversion    Ankle eversion 0-15    (Blank rows = not tested)  LOWER EXTREMITY SPECIAL TESTS Anterior drawer test: negative and Posterior drawer test: negative     PALPATION: Minimal tenderness to palpation to right patella; mobility GOOD Moderate swelling lateral right ankle     TODAY'S TREATMENT:  DATE:   07/04/23 Supine Supine March with Resistance Band- Green 2x10 Small Range Straight Leg Raise 2x10 Figure 4 Bridge  2x10 Clamshell with Resistance- Green 2x10 Standing 3-Way Leg Reach with Resistance at Ankles with G theraband 2x10   07/02/23 Supine  Manual therapy to right knee: soft tissue  mobilization near patella, medial/lateral aspects, ankle right lower extremity for swelling management SAQs + Russian E Stim @ 20 MA, 4:12, 20% suty cycle to VMO- not tolerated   Seated  Single leg press right lower extremity with 30# 3x10  LAQs with 5# 2x10  06/26/23 PT I.E. HEP assigned- see below   PATIENT EDUCATION:  Education details: HEP  Person educated: Patient Education method: Explanation Education comprehension: verbalized understanding  HOME EXERCISE PROGRAM: Access Code: [email or web address] URL: [email or web address] Date: 07/04/2023 Prepared by: Seymour Bars  Exercises - Supine March with Resistance Band  - 3-5 x weekly - 3 sets - 10 reps - Small Range Straight Leg Raise  - 3-5 x weekly - 3 sets - 10 reps - Figure 4 Bridge  - 3-5 x weekly - 3 sets - 10 reps - Clamshell with Resistance  - 3-5 x weekly - 3 sets - 10 reps - Standing 3-Way Leg Reach with Resistance at Ankles and Counter Support  - 3-5 x weekly - 3 sets - 10 reps   ASSESSMENT:  CLINICAL IMPRESSION: Today: Patient received in waiting room today. PT continued lower extremity strengthening/ active range of motion today. Patient able to tolerate well with no increase in pain. PT adjusted HEP to include added therapeutic exercise.  Patient will benefit from PT to address the limitations/impairments listed below to return to their prior level of function in the domains of activity and participation.   Eval: Patient is a 67 y.o. y.o. female who was seen today for physical therapy evaluation and treatment for right > left knee pain, weakness, troubles walking . Patient presents to PT with the following objective impairments: Abnormal gait, decreased activity tolerance, decreased endurance, decreased mobility, difficulty walking, decreased strength, increased edema, postural dysfunction, and pain. These impairments limit the patient in activities such as carrying, lifting, sitting, standing, squatting, stairs,  hygiene/grooming, and locomotion level. These impairments also limit the patient in participation such as meal prep, cleaning, laundry, medication management, personal finances, driving, occupation, and yard work. The patient will benefit from PT to address the limitations/impairments listed below to return to their prior level of function in the domains of activity and participation.    PERSONAL FACTORS:  n/a  are also affecting patient's functional outcome.   REHAB POTENTIAL: Good  CLINICAL DECISION MAKING: Stable/uncomplicated  EVALUATION COMPLEXITY: Low     GOALS: Goals reviewed with patient? No  SHORT TERM GOALS: Target date: 07/17/2023    1. Patient will be independent with a basic stretching/strengthening HEP  Baseline:  Goal status: INITIAL   LONG TERM GOALS: Target date: 08/07/2023    Patient will score a >/= 50/80 on the LEFS    to demonstrate a Minimally Clinically Importance Difference (MCID) in ADL completion, home/community ambulation, and lifting/bending/squatting. Baseline:  Goal status: INITIAL  2.  Patient will complete the 5 times sit to stand:    within 14s to demonstrate an improvement in ADL completion, stair negotiation, household/community ambulation, and self-care Baseline:  Goal status: INITIAL  3.  Patient will be independent with a comprehensive strengthening HEP  Baseline:  Goal status: INITIAL  4. Patient will be able to demonstrate right ankle eversion  active range  of motion to 0-20 degrees to facilitate ADL completion, lifting, squatting. Baseline:  Goal status: INITIAL    PLAN:  PT FREQUENCY: 1-2x/week  PT DURATION: 6 weeks  PLANNED INTERVENTIONS: 97110-Therapeutic exercises, 97530- Therapeutic activity, [id number]- Neuromuscular re-education, 97535- Self Care, 16109- Manual therapy, [phone]- Gait training, 97014- Electrical stimulation (unattended), [phone]- Electrical stimulation (manual), Patient/Family education, Balance training, Stair  training, Taping, Dry Needling, Joint mobilization, Joint manipulation, Spinal manipulation, Spinal mobilization, Cryotherapy, and Moist heat.  PLAN FOR NEXT SESSION: Progress with lower extremity machines for knee/hip; work on [employer], PT 07/04/2023, 4:08 PM

## 2023-07-11 ENCOUNTER — Ambulatory Visit (HOSPITAL_COMMUNITY): Payer: PPO

## 2023-07-11 DIAGNOSIS — M25561 Pain in right knee: Secondary | ICD-10-CM | POA: Diagnosis not present

## 2023-07-11 DIAGNOSIS — M6281 Muscle weakness (generalized): Secondary | ICD-10-CM

## 2023-07-11 DIAGNOSIS — R262 Difficulty in walking, not elsewhere classified: Secondary | ICD-10-CM

## 2023-07-11 NOTE — Therapy (Signed)
[facility] OUTPATIENT PHYSICAL THERAPY EVALUATION (LOWER EXTREMITY)   Patient Name: Amanda Roberts MRN: [id number] DOB:1956/03/01, 68 y.o., female Today's Date: 07/11/2023  END OF SESSION:   PT End of Session - 07/11/23 1432     Visit Number 4    Number of Visits 8    Authorization Type [employer]    Authorization Time Period no auth; no limit    Progress Note Due on Visit 10    PT Start Time 0233    PT Stop Time 0313    PT Time Calculation (min) 40 min    Equipment Utilized During Treatment Gait belt    Activity Tolerance Patient tolerated treatment well    Behavior During Therapy WFL for tasks assessed/performed              Past Medical History:  Diagnosis Date   Asthma    Hypertension    PONV (postoperative nausea and vomiting)    Past Surgical History:  Procedure Laterality Date   ABDOMINAL HYSTERECTOMY     BREAST EXCISIONAL BIOPSY     BREAST LUMPECTOMY WITH RADIOACTIVE SEED LOCALIZATION Left 10/23/2019   Procedure: LEFT BREAST LUMPECTOMY WITH RADIOACTIVE SEED LOCALIZATION;  Surgeon: Griselda Miner, MD;  [facility];  Service: General;  Laterality: Left;   CESAREAN SECTION     DG GALL BLADDER     FOOT SURGERY     Patient Active Problem List   Diagnosis Date Noted   At high risk for breast cancer 03/03/2020   Atypical ductal hyperplasia of left breast 11/27/2019   Essential hypertension 04/05/2017   Chest pain 03/06/2017   Family history of heart disease 03/06/2017    PCP: Lovey Newcomer, PAPCP - General   REFERRING PROVIDER: Sheral Apley, MDRef Provider   REFERRING DIAG: bilateral knee OA   Rationale for Evaluation and Treatment: Rehabilitation  THERAPY DIAG:  Right knee pain, unspecified chronicity  Muscle weakness (generalized)  Difficulty in walking, not elsewhere classified  ONSET DATE: ~ few years    SUBJECTIVE:                                                                                                                                                                                            SUBJECTIVE STATEMENT: Today: Patient reports 0/10 right knee pain; was sore from last PT session    Eval: Patient reports that over the last few years, right > left knee have been in pain with on/off swelling. This continued until patient went to [place] last Friday and got right knee cortisone shot and was referred to OPPT.  PERTINENT HISTORY:  Left foot surgery ~10 years ago   PAIN:  Are you having pain? Yes: NPRS scale: 8/10 but right now 0/10 Pain location: right knee Pain description: sharp Aggravating factors: varies; can be any physical activity Relieving factors: rest  PRECAUTIONS: None  RED FLAGS: None   WEIGHT BEARING RESTRICTIONS: No  FALLS:  Has patient fallen in last 6 months? No  LIVING ENVIRONMENT: Lives with: lives with their spouse Lives in: House/apartment Stairs: Yes: External: 3 steps; can reach both Has following equipment at home: None   OCCUPATION: insurance; sedentary/ part-time  PLOF: Independent  PATIENT GOALS: To walk pain free   NEXT MD VISIT: Feb 14th     OBJECTIVE:   DIAGNOSTIC FINDINGS:  Pending MRI   PATIENT SURVEYS:  LEFS Lower Extremity Functional Score: 41 / 80 = 51.2 %  SCREENING FOR RED FLAGS: Bowel or bladder incontinence: No Spinal tumors: No Cauda equina syndrome: No Compression fracture: No Abdominal aneurysm: No  COGNITION: Overall cognitive status: Within functional limits for tasks assessed  POSTURE: No Significant postural limitations      FUNCTIONAL TESTS:  5 times sit to stand: 16.33s with minimal pushing off legs with upper extremity  20-29 years: 6.0  1.4 seconds 30-39 years: 6.1  1.4 seconds 40-49 years: 7.6  1.8 seconds 50-59 years: 7.7  2.6 seconds 60-69 years: 8.4  0.0 seconds (female), 12.7  1.8 seconds (female) 70-79 years: 11.6  3.4 seconds (female), 13.0  4.8 seconds (female) 80-89 years: 16.7  4.5  seconds (female), 17.2  5.5 seconds (female) 90+ years: 19.5  2.3 seconds (female), 22.9  9.6 seconds (female)  The Minimal Clinically Important Difference (MCID) is 2.3 seconds.  A score of 16 seconds or less indicates that the participant is not likely to fall.  A score of more than 16 seconds indicates a higher risk of falls  As per American Physical Therapy Association (APTA) website    GAIT ANALYSIS: Distance walked: 25 feet   Assistive device utilized: None Level of assistance: Complete Independence Comments: minimal antalgia on right lower extremity   SENSATION: WFL    LOWER EXTREMITY MMT:    MMT Right eval Left eval  Hip flexion 3+/5 3+/5  Hip extension    Hip abduction    Hip adduction    Hip internal rotation    Hip external rotation    Knee flexion 3+/5 4-/5  Knee extension 3+/5 4-/5  Ankle dorsiflexion    Ankle plantarflexion    Ankle inversion 3+/5   Ankle eversion 3/5    (Blank rows = not tested)  LOWER EXTREMITY ROM:     Active  Right eval Left eval  Hip flexion    Hip extension    Hip abduction    Hip adduction    Hip internal rotation    Hip external rotation    Knee flexion WFL   Knee extension WFL   Ankle dorsiflexion    Ankle plantarflexion    Ankle inversion    Ankle eversion 0-15    (Blank rows = not tested)  LOWER EXTREMITY SPECIAL TESTS Anterior drawer test: negative and Posterior drawer test: negative     PALPATION: Minimal tenderness to palpation to right patella; mobility GOOD Moderate swelling lateral right ankle     TODAY'S TREATMENT:  DATE:   07/11/23 Supine  Manual therapy to right knee: soft tissue mobilization near patella, medial/lateral aspects, sartorious, adductors, hip stretch into ER  Sidelying   Hip ball squeezes 10x3-5"H  Hip IR with G theraband 2x10   07/04/23 Supine Supine  March with Resistance Band- Green 2x10 Small Range Straight Leg Raise 2x10 Figure 4 Bridge  2x10 Clamshell with Resistance- Green 2x10 Standing 3-Way Leg Reach with Resistance at Ankles with G theraband 2x10   07/02/23 Supine  Manual therapy to right knee: soft tissue mobilization near patella, medial/lateral aspects, ankle right lower extremity for swelling management SAQs + Russian E Stim @ 20 MA, 4:12, 20% suty cycle to VMO- not tolerated   Seated  Single leg press right lower extremity with 30# 3x10  LAQs with 5# 2x10  06/26/23 PT I.E. HEP assigned- see below   PATIENT EDUCATION:  Education details: HEP  Person educated: Patient Education method: Explanation Education comprehension: verbalized understanding  HOME EXERCISE PROGRAM: Access Code: [email or web address] URL: [email or web address] Date: 07/04/2023 Prepared by: Seymour Bars  Exercises - Supine March with Resistance Band  - 3-5 x weekly - 3 sets - 10 reps - Small Range Straight Leg Raise  - 3-5 x weekly - 3 sets - 10 reps - Figure 4 Bridge  - 3-5 x weekly - 3 sets - 10 reps - Clamshell with Resistance  - 3-5 x weekly - 3 sets - 10 reps - Standing 3-Way Leg Reach with Resistance at Ankles and Counter Support  - 3-5 x weekly - 3 sets - 10 reps   ASSESSMENT:  CLINICAL IMPRESSION: Today: Patient received in waiting room today. PT continued lower extremity strengthening/ active range of motion today. PT to find limited hip Er on the right lower extremity of patient. PT adjusted session/HEP to address hip ER. Patient will benefit from PT to address the limitations/impairments listed below to return to their prior level of function in the domains of activity and participation.   Eval: Patient is a 68 y.o. y.o. female who was seen today for physical therapy evaluation and treatment for right > left knee pain, weakness, troubles walking . Patient presents to PT with the following objective impairments: Abnormal gait,  decreased activity tolerance, decreased endurance, decreased mobility, difficulty walking, decreased strength, increased edema, postural dysfunction, and pain. These impairments limit the patient in activities such as carrying, lifting, sitting, standing, squatting, stairs, hygiene/grooming, and locomotion level. These impairments also limit the patient in participation such as meal prep, cleaning, laundry, medication management, personal finances, driving, occupation, and yard work. The patient will benefit from PT to address the limitations/impairments listed below to return to their prior level of function in the domains of activity and participation.    PERSONAL FACTORS:  n/a  are also affecting patient's functional outcome.   REHAB POTENTIAL: Good  CLINICAL DECISION MAKING: Stable/uncomplicated  EVALUATION COMPLEXITY: Low     GOALS: Goals reviewed with patient? No  SHORT TERM GOALS: Target date: 07/17/2023    1. Patient will be independent with a basic stretching/strengthening HEP  Baseline:  Goal status: INITIAL   LONG TERM GOALS: Target date: 08/07/2023    Patient will score a >/= 50/80 on the LEFS    to demonstrate a Minimally Clinically Importance Difference (MCID) in ADL completion, home/community ambulation, and lifting/bending/squatting. Baseline:  Goal status: INITIAL  2.  Patient will complete the 5 times sit to stand:    within 14s to demonstrate an improvement in ADL completion, stair negotiation, household/community ambulation,  and self-care Baseline:  Goal status: INITIAL  3.  Patient will be independent with a comprehensive strengthening HEP  Baseline:  Goal status: INITIAL  4. Patient will be able to demonstrate right ankle eversion  active range of motion to 0-20 degrees to facilitate ADL completion, lifting, squatting. Baseline:  Goal status: INITIAL    PLAN:  PT FREQUENCY: 1-2x/week  PT DURATION: 6 weeks  PLANNED INTERVENTIONS:  97110-Therapeutic exercises, 97530- Therapeutic activity, [id number]- Neuromuscular re-education, 97535- Self Care, 17616- Manual therapy, [phone]- Gait training, 97014- Electrical stimulation (unattended), [phone]- Electrical stimulation (manual), Patient/Family education, Balance training, Stair training, Taping, Dry Needling, Joint mobilization, Joint manipulation, Spinal manipulation, Spinal mobilization, Cryotherapy, and Moist heat.  PLAN FOR NEXT SESSION: Progress with lower extremity machines for knee/hip; work on [employer], PT 07/11/2023, 2:33 PM

## 2023-07-18 ENCOUNTER — Ambulatory Visit (HOSPITAL_COMMUNITY): Payer: PPO

## 2023-07-18 DIAGNOSIS — R262 Difficulty in walking, not elsewhere classified: Secondary | ICD-10-CM

## 2023-07-18 DIAGNOSIS — M25561 Pain in right knee: Secondary | ICD-10-CM

## 2023-07-18 DIAGNOSIS — M6281 Muscle weakness (generalized): Secondary | ICD-10-CM

## 2023-07-18 NOTE — Therapy (Signed)
[facility] OUTPATIENT PHYSICAL THERAPY EVALUATION (LOWER EXTREMITY)   Patient Name: Amanda Roberts MRN: [id number] DOB:07-Nov-1955, 68 y.o., female Today's Date: 07/18/2023  END OF SESSION:   PT End of Session - 07/18/23 1426     Visit Number 5    Number of Visits 8    Date for PT Re-Evaluation 08/07/23    Authorization Type [employer]    Authorization Time Period no auth; no limit    Progress Note Due on Visit 10    PT Start Time 0230    PT Stop Time 0310    PT Time Calculation (min) 40 min    Equipment Utilized During Treatment Gait belt    Activity Tolerance Patient tolerated treatment well    Behavior During Therapy WFL for tasks assessed/performed              Past Medical History:  Diagnosis Date   Asthma    Hypertension    PONV (postoperative nausea and vomiting)    Past Surgical History:  Procedure Laterality Date   ABDOMINAL HYSTERECTOMY     BREAST EXCISIONAL BIOPSY     BREAST LUMPECTOMY WITH RADIOACTIVE SEED LOCALIZATION Left 10/23/2019   Procedure: LEFT BREAST LUMPECTOMY WITH RADIOACTIVE SEED LOCALIZATION;  Surgeon: Griselda Miner, MD;  Location: [facility];  Service: General;  Laterality: Left;   CESAREAN SECTION     DG GALL BLADDER     FOOT SURGERY     Patient Active Problem List   Diagnosis Date Noted   At high risk for breast cancer 03/03/2020   Atypical ductal hyperplasia of left breast 11/27/2019   Essential hypertension 04/05/2017   Chest pain 03/06/2017   Family history of heart disease 03/06/2017    PCP: Lovey Newcomer, PAPCP - General   REFERRING PROVIDER: Sheral Apley, MDRef Provider   REFERRING DIAG: bilateral knee OA   Rationale for Evaluation and Treatment: Rehabilitation  THERAPY DIAG:   Right knee pain, unspecified chronicity  Muscle weakness (generalized)  Difficulty in walking, not elsewhere classified  ONSET DATE: ~ few years    SUBJECTIVE:                                                                                                                                                                                            SUBJECTIVE STATEMENT: Today: Patient reports that the right lower extremity becomes fatigued when shopping but the sharp pain she used to experience is now "dull"   Eval: Patient reports that over the last few years, right > left knee have been in pain with on/off swelling. This continued until patient went  to [facility] last Friday and got right knee cortisone shot and was referred to OPPT.  PERTINENT HISTORY:  Left foot surgery ~10 years ago   PAIN:  Are you having pain? Yes: NPRS scale: 8/10 but right now 0/10 Pain location: right knee Pain description: sharp Aggravating factors: varies; can be any physical activity Relieving factors: rest  PRECAUTIONS: None  RED FLAGS: None   WEIGHT BEARING RESTRICTIONS: No  FALLS:  Has patient fallen in last 6 months? No  LIVING ENVIRONMENT: Lives with: lives with their spouse Lives in: House/apartment Stairs: Yes: External: 3 steps; can reach both Has following equipment at home: None   OCCUPATION: insurance; sedentary/ part-time  PLOF: Independent  PATIENT GOALS: To walk pain free   NEXT MD VISIT: Feb 14th     OBJECTIVE:   DIAGNOSTIC FINDINGS:  Pending MRI   PATIENT SURVEYS:  LEFS Lower Extremity Functional Score: 41 / 80 = 51.2 %  SCREENING FOR RED FLAGS: Bowel or bladder incontinence: No Spinal tumors: No Cauda equina syndrome: No Compression fracture: No Abdominal aneurysm: No  COGNITION: Overall cognitive status: Within functional limits for tasks assessed  POSTURE: No Significant postural limitations      FUNCTIONAL TESTS:  5 times sit to stand: 16.33s with minimal pushing off legs with upper extremity  20-29 years: 6.0  1.4 seconds 30-39 years: 6.1  1.4 seconds 40-49 years: 7.6  1.8 seconds 50-59 years: 7.7  2.6 seconds 60-69 years: 8.4  0.0 seconds (female), 12.7  1.8  seconds (female) 70-79 years: 11.6  3.4 seconds (female), 13.0  4.8 seconds (female) 80-89 years: 16.7  4.5 seconds (female), 17.2  5.5 seconds (female) 90+ years: 19.5  2.3 seconds (female), 22.9  9.6 seconds (female)  The Minimal Clinically Important Difference (MCID) is 2.3 seconds.  A score of 16 seconds or less indicates that the participant is not likely to fall.  A score of more than 16 seconds indicates a higher risk of falls  As per American Physical Therapy Association (APTA) website    GAIT ANALYSIS: Distance walked: 25 feet   Assistive device utilized: None Level of assistance: Complete Independence Comments: minimal antalgia on right lower extremity   SENSATION: WFL    LOWER EXTREMITY MMT:    MMT Right eval Left eval  Hip flexion 3+/5 3+/5  Hip extension    Hip abduction    Hip adduction    Hip internal rotation    Hip external rotation    Knee flexion 3+/5 4-/5  Knee extension 3+/5 4-/5  Ankle dorsiflexion    Ankle plantarflexion    Ankle inversion 3+/5   Ankle eversion 3/5    (Blank rows = not tested)  LOWER EXTREMITY ROM:     Active  Right eval Left eval  Hip flexion    Hip extension    Hip abduction    Hip adduction    Hip internal rotation    Hip external rotation    Knee flexion WFL   Knee extension WFL   Ankle dorsiflexion    Ankle plantarflexion    Ankle inversion    Ankle eversion 0-15    (Blank rows = not tested)  LOWER EXTREMITY SPECIAL TESTS Anterior drawer test: negative and Posterior drawer test: negative     PALPATION: Minimal tenderness to palpation to right patella; mobility GOOD Moderate swelling lateral right ankle     TODAY'S TREATMENT:  DATE:   07/18/23 NuStep warm-up L3 Gait Training with single point cane, 2 pt step through Single leg press with 30# 2x10 Weighted sled pushes to  facilitate pushing x2 laps around clinic  Standing calf stretch on slant board  Heel raises on slant board 2x10    07/11/23 Supine  Manual therapy to right knee: soft tissue mobilization near patella, medial/lateral aspects, sartorious, adductors, hip stretch into ER  Sidelying   Hip ball squeezes 10x3-5"H  Hip IR with G theraband 2x10   07/04/23 Supine Supine March with Resistance Band- Green 2x10 Small Range Straight Leg Raise 2x10 Figure 4 Bridge  2x10 Clamshell with Resistance- Green 2x10 Standing 3-Way Leg Reach with Resistance at Ankles with G theraband 2x10   07/02/23 Supine  Manual therapy to right knee: soft tissue mobilization near patella, medial/lateral aspects, ankle right lower extremity for swelling management SAQs + Russian E Stim @ 20 MA, 4:12, 20% suty cycle to VMO- not tolerated   Seated  Single leg press right lower extremity with 30# 3x10  LAQs with 5# 2x10  06/26/23 PT I.E. HEP assigned- see below   PATIENT EDUCATION:  Education details: HEP  Person educated: Patient Education method: Explanation Education comprehension: verbalized understanding  HOME EXERCISE PROGRAM: Access Code: [email or web address] URL: [email or web address] Date: 07/18/2023 Prepared by: Seymour Bars  Exercises - Supine March with Resistance Band  - 3-5 x weekly - 3 sets - 10 reps - Small Range Straight Leg Raise  - 3-5 x weekly - 3 sets - 10 reps - Supine Figure 4 Piriformis Stretch  - 3-5 x weekly - 10 reps - 10 seconds hold - Figure 4 Bridge  - 3-5 x weekly - 3 sets - 10 reps - Clamshell with Resistance  - 3-5 x weekly - 3 sets - 10 reps - Seated Knee Extension with Resistance  - 3-5 x weekly - 3 sets - 10 reps - Standing 3-Way Leg Reach with Resistance at Ankles and Counter Support  - 3-5 x weekly - 3 sets - 10 reps   ASSESSMENT:  CLINICAL IMPRESSION: Today: Patient received in waiting room today. PT progressed patient with Gait Training focused on using single  point cane for energy conservation to facilitate community ambulation. PT then worked on Therapeutic Activity to facilitate pushing. Patient able to tolerate session well with minimal fatigue. Pt will be returning to [facility] after next PT session; PT to discuss PT discharge soon if patient is pain-free.   Patient will benefit from PT to address the limitations/impairments listed below to return to their prior level of function in the domains of activity and participation.   Eval: Patient is a 68 y.o. y.o. female who was seen today for physical therapy evaluation and treatment for right > left knee pain, weakness, troubles walking . Patient presents to PT with the following objective impairments: Abnormal gait, decreased activity tolerance, decreased endurance, decreased mobility, difficulty walking, decreased strength, increased edema, postural dysfunction, and pain. These impairments limit the patient in activities such as carrying, lifting, sitting, standing, squatting, stairs, hygiene/grooming, and locomotion level. These impairments also limit the patient in participation such as meal prep, cleaning, laundry, medication management, personal finances, driving, occupation, and yard work. The patient will benefit from PT to address the limitations/impairments listed below to return to their prior level of function in the domains of activity and participation.    PERSONAL FACTORS:  n/a  are also affecting patient's functional outcome.   REHAB POTENTIAL: Good  CLINICAL DECISION MAKING: Stable/uncomplicated  EVALUATION COMPLEXITY: Low     GOALS: Goals reviewed with patient? No  SHORT TERM GOALS: Target date: 07/17/2023    1. Patient will be independent with a basic stretching/strengthening HEP  Baseline:  Goal status: INITIAL   LONG TERM GOALS: Target date: 08/07/2023    Patient will score a >/= 50/80 on the LEFS    to demonstrate a Minimally Clinically Importance Difference (MCID) in ADL  completion, home/community ambulation, and lifting/bending/squatting. Baseline:  Goal status: INITIAL  2.  Patient will complete the 5 times sit to stand:    within 14s to demonstrate an improvement in ADL completion, stair negotiation, household/community ambulation, and self-care Baseline:  Goal status: INITIAL  3.  Patient will be independent with a comprehensive strengthening HEP  Baseline:  Goal status: INITIAL  4. Patient will be able to demonstrate right ankle eversion  active range of motion to 0-20 degrees to facilitate ADL completion, lifting, squatting. Baseline:  Goal status: INITIAL    PLAN:  PT FREQUENCY: 1-2x/week  PT DURATION: 6 weeks  PLANNED INTERVENTIONS: 97110-Therapeutic exercises, 97530- Therapeutic activity, [id number]- Neuromuscular re-education, 97535- Self Care, 16109- Manual therapy, [phone]- Gait training, 97014- Electrical stimulation (unattended), [phone]- Electrical stimulation (manual), Patient/Family education, Balance training, Stair training, Taping, Dry Needling, Joint mobilization, Joint manipulation, Spinal manipulation, Spinal mobilization, Cryotherapy, and Moist heat.  PLAN FOR NEXT SESSION: Patient still has troubles bending to lift object/ squatting pain-free; work on Therapeutic Activity such as hip hinges/squatting(add to HEP); d/c if pain-free within the next 2 visits  Seymour Bars, PT 07/18/2023, 3:12 PM

## 2023-07-25 ENCOUNTER — Ambulatory Visit (HOSPITAL_COMMUNITY): Payer: PPO | Attending: Orthopedic Surgery

## 2023-07-25 DIAGNOSIS — M6281 Muscle weakness (generalized): Secondary | ICD-10-CM | POA: Insufficient documentation

## 2023-07-25 DIAGNOSIS — M25561 Pain in right knee: Secondary | ICD-10-CM | POA: Diagnosis not present

## 2023-07-25 DIAGNOSIS — R262 Difficulty in walking, not elsewhere classified: Secondary | ICD-10-CM | POA: Insufficient documentation

## 2023-07-25 NOTE — Therapy (Signed)
 [facility]IENT PHYSICAL THERAPY TREATMENT (LOWER EXTREMITY)   Patient Name: Amanda Roberts MRN: [id number] DOB:07-09-1955, 68 y.o., female Today's Date: 07/25/2023  END OF SESSION:   PT End of Session - 07/25/23 1435     Visit Number 6    Number of Visits 8    Date for PT Re-Evaluation 08/07/23    Authorization Type [employer]    Authorization Time Period no auth; no limit    Progress Note Due on Visit 10    PT Start Time 1430    PT Stop Time 1510    PT Time Calculation (min) 40 min    Activity Tolerance Patient tolerated treatment well    Behavior During Therapy WFL for tasks assessed/performed               Past Medical History:  Diagnosis Date   Asthma    Hypertension    PONV (postoperative nausea and vomiting)    Past Surgical History:  Procedure Laterality Date   ABDOMINAL HYSTERECTOMY     BREAST EXCISIONAL BIOPSY     BREAST LUMPECTOMY WITH RADIOACTIVE SEED LOCALIZATION Left 10/23/2019   Procedure: LEFT BREAST LUMPECTOMY WITH RADIOACTIVE SEED LOCALIZATION;  Surgeon: Curvin Deward MOULD, MD;  Location: [facility];  Service: General;  Laterality: Left;   CESAREAN SECTION     DG GALL BLADDER     FOOT SURGERY     Patient Active Problem List   Diagnosis Date Noted   At high risk for breast cancer 03/03/2020   Atypical ductal hyperplasia of left breast 11/27/2019   Essential hypertension 04/05/2017   Chest pain 03/06/2017   Family history of heart disease 03/06/2017    PCP: Jolee Elsie RAMAN, PAPCP - General   REFERRING PROVIDER: Beverley Evalene BIRCH, MDRef Provider   REFERRING DIAG: bilateral knee OA   Rationale for Evaluation and Treatment: Rehabilitation  THERAPY DIAG:   Right knee pain, unspecified chronicity  Muscle weakness (generalized)  Difficulty in walking, not elsewhere classified  ONSET DATE: ~ few years    SUBJECTIVE:                                                                                                                                                                                            SUBJECTIVE STATEMENT: Today:  Doing well today and denies pain. However, patient had a sharp pain on the knee last Sunday but is now gone.   Eval: Patient reports that over the last few years, right > left knee have been in pain with on/off swelling. This continued until patient went to [facility] last Friday and got right knee  cortisone shot and was referred to OPPT.  PERTINENT HISTORY:  Left foot surgery ~10 years ago   PAIN:  Are you having pain? Yes: NPRS scale: 8/10 but right now 0/10 Pain location: right knee Pain description: sharp Aggravating factors: varies; can be any physical activity Relieving factors: rest  PRECAUTIONS: None  RED FLAGS: None   WEIGHT BEARING RESTRICTIONS: No  FALLS:  Has patient fallen in last 6 months? No  LIVING ENVIRONMENT: Lives with: lives with their spouse Lives in: House/apartment Stairs: Yes: External: 3 steps; can reach both Has following equipment at home: None   OCCUPATION: insurance; sedentary/ part-time  PLOF: Independent  PATIENT GOALS: To walk pain free   NEXT MD VISIT: Feb 14th     OBJECTIVE:   DIAGNOSTIC FINDINGS:  Pending MRI   PATIENT SURVEYS:  LEFS Lower Extremity Functional Score: 41 / 80 = 51.2 %  SCREENING FOR RED FLAGS: Bowel or bladder incontinence: No Spinal tumors: No Cauda equina syndrome: No Compression fracture: No Abdominal aneurysm: No  COGNITION: Overall cognitive status: Within functional limits for tasks assessed  POSTURE: No Significant postural limitations      FUNCTIONAL TESTS:  5 times sit to stand: 16.33s with minimal pushing off legs with upper extremity  20-29 years: 6.0  1.4 seconds 30-39 years: 6.1  1.4 seconds 40-49 years: 7.6  1.8 seconds 50-59 years: 7.7  2.6 seconds 60-69 years: 8.4  0.0 seconds (female), 12.7  1.8 seconds (female) 70-79 years: 11.6  3.4 seconds (female), 13.0  4.8 seconds  (female) 80-89 years: 16.7  4.5 seconds (female), 17.2  5.5 seconds (female) 90+ years: 19.5  2.3 seconds (female), 22.9  9.6 seconds (female)  The Minimal Clinically Important Difference (MCID) is 2.3 seconds.  A score of 16 seconds or less indicates that the participant is not likely to fall.  A score of more than 16 seconds indicates a higher risk of falls  As per American Physical Therapy Association (APTA) website    GAIT ANALYSIS: Distance walked: 25 feet   Assistive device utilized: None Level of assistance: Complete Independence Comments: minimal antalgia on right lower extremity   SENSATION: WFL    LOWER EXTREMITY MMT:    MMT Right eval Left eval  Hip flexion 3+/5 3+/5  Hip extension    Hip abduction    Hip adduction    Hip internal rotation    Hip external rotation    Knee flexion 3+/5 4-/5  Knee extension 3+/5 4-/5  Ankle dorsiflexion    Ankle plantarflexion    Ankle inversion 3+/5   Ankle eversion 3/5    (Blank rows = not tested)  LOWER EXTREMITY ROM:     Active  Right eval Left eval  Hip flexion    Hip extension    Hip abduction    Hip adduction    Hip internal rotation    Hip external rotation    Knee flexion WFL   Knee extension WFL   Ankle dorsiflexion    Ankle plantarflexion    Ankle inversion    Ankle eversion 0-15    (Blank rows = not tested)  LOWER EXTREMITY SPECIAL TESTS Anterior drawer test: negative and Posterior drawer test: negative     PALPATION: Minimal tenderness to palpation to right patella; mobility GOOD Moderate swelling lateral right ankle     TODAY'S TREATMENT:  DATE:  07/25/23 NuStep, L4, seat 6, 5' Calf stretch with a strap x 30 x 2 Gr. 2 patellar mob (inf/sup) x 1' on each side Prone Quad stretch with a strap x 30 x 2 on each Mini squats with RTB on thighs x 3 x 10 x 2 Step  downs on half foam rolls x 10 x 2 on each Walking backwards, GTB x 3 rounds inside // bars Walking sideways, GTB, 3 rounds inside // bars  07/18/23 NuStep warm-up L3 Gait Training with single point cane, 2 pt step through Single leg press with 30# 2x10 Weighted sled pushes to facilitate pushing x2 laps around clinic  Standing calf stretch on slant board  Heel raises on slant board 2x10    07/11/23 Supine  Manual therapy to right knee: soft tissue mobilization near patella, medial/lateral aspects, sartorious, adductors, hip stretch into ER  Sidelying   Hip ball squeezes 10x3-5H  Hip IR with G theraband 2x10   07/04/23 Supine Supine March with Resistance Band- Green 2x10 Small Range Straight Leg Raise 2x10 Figure 4 Bridge  2x10 Clamshell with Resistance- Green 2x10 Standing 3-Way Leg Reach with Resistance at Ankles with G theraband 2x10   07/02/23 Supine  Manual therapy to right knee: soft tissue mobilization near patella, medial/lateral aspects, ankle right lower extremity for swelling management SAQs + Russian E Stim @ 20 MA, 4:12, 20% suty cycle to VMO- not tolerated   Seated  Single leg press right lower extremity with 30# 3x10  LAQs with 5# 2x10  06/26/23 PT I.E. HEP assigned- see below   PATIENT EDUCATION:  Education details: HEP  Person educated: Patient Education method: Explanation Education comprehension: verbalized understanding  HOME EXERCISE PROGRAM: Access Code: [email or web address] URL: [email or web address] 07/25/2023 - Seated Calf Stretch with Strap  - 1 x daily - 3-5 x weekly - 3 reps - 30 hold - Prone Quadriceps Stretch with Strap  - 1 x daily - 3-5 x weekly - 3 reps - 30 hold - Mini Squat with Counter Support  - 1 x daily - 3-5 x weekly - 2 sets - 10 reps - 3 hold  Date: 07/18/2023 Prepared by: Deward Ming  Exercises - Supine March with Resistance Band  - 3-5 x weekly - 3 sets - 10 reps - Small Range Straight Leg Raise  - 3-5 x weekly - 3  sets - 10 reps - Supine Figure 4 Piriformis Stretch  - 3-5 x weekly - 10 reps - 10 seconds hold - Figure 4 Bridge  - 3-5 x weekly - 3 sets - 10 reps - Clamshell with Resistance  - 3-5 x weekly - 3 sets - 10 reps - Seated Knee Extension with Resistance  - 3-5 x weekly - 3 sets - 10 reps - Standing 3-Way Leg Reach with Resistance at Ankles and Counter Support  - 3-5 x weekly - 3 sets - 10 reps   ASSESSMENT:  CLINICAL IMPRESSION: Today: Interventions today were geared towards LE mobility and strengthening. Tolerated all activities without worsening of symptoms. Demonstrated appropriate levels of fatigue. Provided slight amount of cueing to ensure correct execution of activity with good carry-over. To date, skilled PT is required to address the impairments and improve function.   Patient will benefit from PT to address the limitations/impairments listed below to return to their prior level of function in the domains of activity and participation.   Eval: Patient is a 68 y.o. y.o. female who was seen today for physical therapy evaluation and treatment  for right > left knee pain, weakness, troubles walking . Patient presents to PT with the following objective impairments: Abnormal gait, decreased activity tolerance, decreased endurance, decreased mobility, difficulty walking, decreased strength, increased edema, postural dysfunction, and pain. These impairments limit the patient in activities such as carrying, lifting, sitting, standing, squatting, stairs, hygiene/grooming, and locomotion level. These impairments also limit the patient in participation such as meal prep, cleaning, laundry, medication management, personal finances, driving, occupation, and yard work. The patient will benefit from PT to address the limitations/impairments listed below to return to their prior level of function in the domains of activity and participation.    PERSONAL FACTORS:  n/a  are also affecting patient's functional  outcome.   REHAB POTENTIAL: Good  CLINICAL DECISION MAKING: Stable/uncomplicated  EVALUATION COMPLEXITY: Low     GOALS: Goals reviewed with patient? No  SHORT TERM GOALS: Target date: 07/17/2023    1. Patient will be independent with a basic stretching/strengthening HEP  Baseline:  Goal status: INITIAL   LONG TERM GOALS: Target date: 08/07/2023    Patient will score a >/= 50/80 on the LEFS    to demonstrate a Minimally Clinically Importance Difference (MCID) in ADL completion, home/community ambulation, and lifting/bending/squatting. Baseline:  Goal status: INITIAL  2.  Patient will complete the 5 times sit to stand:    within 14s to demonstrate an improvement in ADL completion, stair negotiation, household/community ambulation, and self-care Baseline:  Goal status: INITIAL  3.  Patient will be independent with a comprehensive strengthening HEP  Baseline:  Goal status: INITIAL  4. Patient will be able to demonstrate right ankle eversion  active range of motion to 0-20 degrees to facilitate ADL completion, lifting, squatting. Baseline:  Goal status: INITIAL    PLAN:  PT FREQUENCY: 1-2x/week  PT DURATION: 6 weeks  PLANNED INTERVENTIONS: 97110-Therapeutic exercises, 97530- Therapeutic activity, [id number]- Neuromuscular re-education, 97535- Self Care, [id number]- Manual therapy, [phone]- Gait training, 97014- Electrical stimulation (unattended), [phone]- Electrical stimulation (manual), Patient/Family education, Balance training, Stair training, Taping, Dry Needling, Joint mobilization, Joint manipulation, Spinal manipulation, Spinal mobilization, Cryotherapy, and Moist heat.  PLAN FOR NEXT SESSION: Patient still has troubles bending to lift object/ squatting pain-free; work on Therapeutic Activity such as hip hinges/squatting(add to HEP); d/c if pain-free within the next 2 visits  Vinie L. Keyia Moretto, PT, DPT, OCS Board-Certified Clinical Specialist in Orthopedic PT PT Compact  Privilege # ([place]): [id number] T 07/25/2023, 3:14 PM

## 2023-08-02 DIAGNOSIS — M25562 Pain in left knee: Secondary | ICD-10-CM | POA: Diagnosis not present

## 2023-08-02 DIAGNOSIS — M25561 Pain in right knee: Secondary | ICD-10-CM | POA: Diagnosis not present

## 2023-08-06 DIAGNOSIS — M25561 Pain in right knee: Secondary | ICD-10-CM | POA: Diagnosis not present

## 2023-08-06 DIAGNOSIS — M25562 Pain in left knee: Secondary | ICD-10-CM | POA: Diagnosis not present

## 2023-08-15 DIAGNOSIS — I10 Essential (primary) hypertension: Secondary | ICD-10-CM | POA: Diagnosis not present

## 2023-08-15 DIAGNOSIS — Z853 Personal history of malignant neoplasm of breast: Secondary | ICD-10-CM | POA: Diagnosis not present

## 2023-08-15 DIAGNOSIS — M1711 Unilateral primary osteoarthritis, right knee: Secondary | ICD-10-CM | POA: Diagnosis not present

## 2023-08-15 DIAGNOSIS — Z0000 Encounter for general adult medical examination without abnormal findings: Secondary | ICD-10-CM | POA: Diagnosis not present

## 2023-08-15 DIAGNOSIS — Z6829 Body mass index (BMI) 29.0-29.9, adult: Secondary | ICD-10-CM | POA: Diagnosis not present

## 2023-08-15 DIAGNOSIS — R002 Palpitations: Secondary | ICD-10-CM | POA: Diagnosis not present

## 2023-08-21 DIAGNOSIS — M25561 Pain in right knee: Secondary | ICD-10-CM | POA: Diagnosis not present

## 2023-08-22 ENCOUNTER — Encounter: Payer: Self-pay | Admitting: *Deleted

## 2023-08-22 NOTE — Progress Notes (Signed)
 Received fax from [facility] for preoperative risk assessment for right total knee replacement.  Per MD pt to hold Tamoxifen 1 week prior and resume 2 weeks post surgery as long as she is up and ambulating well.  Pt educated and verbalized understanding.  Risk assessment successfully faxed to [phone].

## 2023-08-27 ENCOUNTER — Telehealth: Payer: Self-pay | Admitting: *Deleted

## 2023-08-27 NOTE — Telephone Encounter (Signed)
   Pre-operative Risk Assessment    Patient Name: Amanda Roberts  DOB: [birth year] MRN: [id number]   Date of last office visit: N/A NEW PT REFERRAL / REFERRAL SENT TO REFERRAL TEAM TO SCHEDULE FOR PALPITATIONS Date of next office visit: N/A   Request for Surgical Clearance    Procedure:   RT TOTAL KNEE ARTHROPLASTY  Date of Surgery:  Clearance TBD                                Surgeon:  Margarita Rana, MD Surgeon's Group or Practice Name:  [facility] Phone number:  [phone] Fax number:  [phone]   Type of Clearance Requested:   - Medical    Type of Anesthesia:  Spinal   Additional requests/questions:    SignedElliot Cousin   08/27/2023, 9:22 AM

## 2023-08-28 NOTE — Telephone Encounter (Signed)
 Patient is following up to schedule appointment. There aren't any openings in [place] or [place], which patient prefers, for the next few months. Is there anywhere else patient can be worked in? She says she's unable to drive to [place], but she wants to be evaluated soon because they are planning on scheduling her procedure for next month. Please advise. Thank you.

## 2023-08-28 NOTE — Telephone Encounter (Signed)
   Name: Amanda Roberts  DOB: August 25, 1955  MRN: [id number]  Primary Cardiologist: None  Chart reviewed as part of pre-operative protocol coverage.  The patient was last seen in our office by Dr. Allyson Sabal in 2018. Because of [name]'s past medical history and time since last visit, she will require a follow-up in-office visit in order to better assess preoperative cardiovascular risk.  Pre-op covering staff: - Please schedule appointment and call patient to inform them. If patient already had an upcoming appointment within acceptable timeframe, please add "pre-op clearance" to the appointment notes so provider is aware. - Please contact requesting surgeon's office via preferred method (i.e, phone, fax) to inform them of need for appointment prior to surgery.  This note will be removed from the preop pool.  Please route this note to the provider that we will see the patient in the office.  Route note back to P CV DIV PREOP if needed.  Tereso Newcomer, PA-C  08/28/2023, 7:56 AM

## 2023-08-28 NOTE — Telephone Encounter (Signed)
 I s/w the pt and offered her a new pt appt 8 am 09/02/23 with Dr. Clifton James. Pt was so very grateful for my help. I will update all parties involved pt has appt 09/02/23 new pt appt for preop clearance.

## 2023-09-02 ENCOUNTER — Encounter: Payer: Self-pay | Admitting: Cardiovascular Disease

## 2023-09-02 ENCOUNTER — Ambulatory Visit: Attending: Cardiovascular Disease | Admitting: Cardiovascular Disease

## 2023-09-02 DIAGNOSIS — Z01810 Encounter for preprocedural cardiovascular examination: Secondary | ICD-10-CM

## 2023-09-02 DIAGNOSIS — I10 Essential (primary) hypertension: Secondary | ICD-10-CM

## 2023-09-02 NOTE — Patient Instructions (Signed)
 Medication Instructions:  Your physician recommends that you continue on your current medications as directed. Please refer to the Current Medication list given to you today.  *If you need a refill on your cardiac medications before your next appointment, please call your pharmacy*  Lab Work: If you have labs (blood work) drawn today and your tests are completely normal, you will receive your results only by: MyChart Message (if you have MyChart) OR A paper copy in the mail If you have any lab test that is abnormal or we need to change your treatment, we will call you to review the results.  Testing/Procedures: Your physician has requested that you have an echocardiogram. Echocardiography is a painless test that uses sound waves to create images of your heart. It provides your doctor with information about the size and shape of your heart and how well your heart's chambers and valves are working. This procedure takes approximately one hour. There are no restrictions for this procedure. Please do NOT wear cologne, perfume, aftershave, or lotions (deodorant is allowed). Please arrive 15 minutes prior to your appointment time.  Please note: We ask at that you not bring children with you during ultrasound (echo/ vascular) testing. Due to room size and safety concerns, children are not allowed in the ultrasound rooms during exams. Our front office staff cannot provide observation of children in our lobby area while testing is being conducted. An adult accompanying a patient to their appointment will only be allowed in the ultrasound room at the discretion of the ultrasound technician under special circumstances. We apologize for any inconvenience Follow-Up: At [facility], you and your health needs are our priority.  As part of our continuing mission to provide you with exceptional heart care, we have created designated Provider Care Teams.  These Care Teams include your primary Cardiologist  (physician) and Advanced Practice Providers (APPs -  Physician Assistants and Nurse Practitioners) who all work together to provide you with the care you need, when you need it.  We recommend signing up for the patient portal called "MyChart".  Sign up information is provided on this After Visit Summary.  MyChart is used to connect with patients for Virtual Visits (Telemedicine).  Patients are able to view lab/test results, encounter notes, upcoming appointments, etc.  Non-urgent messages can be sent to your provider as well.   To learn more about what you can do with MyChart, go to [email or web address].    Your next appointment:   1 year(s)  Provider:   Dr. Clifton James     Other Instructions       1st Floor: - Lobby - Registration  - Pharmacy  - Lab - Cafe  2nd Floor: - PV Lab - Diagnostic Testing (echo, CT, nuclear med)  3rd Floor: - Vacant  4th Floor: - TCTS (cardiothoracic surgery) - AFib Clinic - Structural Heart Clinic - Vascular Surgery  - Vascular Ultrasound  5th Floor: - [facility]Care Cardiology (general and EP) - Clinical Pharmacy for coumadin, hypertension, lipid, weight-loss medications, and med management appointments    Valet parking services will be available as well.

## 2023-09-02 NOTE — Progress Notes (Signed)
 Chief Complaint  Patient presents with   New Patient (Initial Visit)    Pre-operative risk assessment   History of Present Illness: 68 yo female with history of asthma and HTN who is here today as a new consult, referred by Daria Pastures, PA-C for pre-operative risk assessment. She was seen in our office in 2018 by Dr. Allyson Sabal. Normal stress test in 2018. She tells me today that she has an upcoming right knee replacement. She has no complaints today. She wanted to be checked out before her knee surgery. No chest pain or dyspnea. No palpitations. No LE edema.   Primary Care Physician: Lovey Newcomer, PA   Past Medical History:  Diagnosis Date   Asthma    Hypertension    PONV (postoperative nausea and vomiting)     Past Surgical History:  Procedure Laterality Date   ABDOMINAL HYSTERECTOMY     BREAST EXCISIONAL BIOPSY     BREAST LUMPECTOMY WITH RADIOACTIVE SEED LOCALIZATION Left 10/23/2019   Procedure: LEFT BREAST LUMPECTOMY WITH RADIOACTIVE SEED LOCALIZATION;  Surgeon: Griselda Miner, MD;  Location: [facility];  Service: General;  Laterality: Left;   CESAREAN SECTION     DG GALL BLADDER     FOOT SURGERY      Current Outpatient Medications  Medication Sig Dispense Refill   albuterol (PROVENTIL HFA;VENTOLIN HFA) 108 (90 Base) MCG/ACT inhaler Inhale 2 puffs into the lungs as directed.     clobetasol cream (TEMOVATE) 0.05 % Apply topically as needed (redness).     cyclobenzaprine (FLEXERIL) 10 MG tablet Take 1 tablet by mouth daily as needed for muscle spasms.     furosemide (LASIX) 40 MG tablet Take 1 tablet by mouth daily as needed.     hydrochlorothiazide (HYDRODIURIL) 25 MG tablet Take 25 mg by mouth daily. 1/2 tablet a day     metoprolol succinate (TOPROL-XL) 50 MG 24 hr tablet Take 1 tablet by mouth daily. 1 1/4 Tabs a day     montelukast (SINGULAIR) 10 MG tablet Take 10 mg by mouth as directed.     Multiple Vitamin (MULTIVITAMIN) tablet Take 1 tablet by mouth  daily.     nitrofurantoin, macrocrystal-monohydrate, (MACROBID) 100 MG capsule Take by mouth 2 (two) times daily as needed.     rosuvastatin (CRESTOR) 5 MG tablet Take 5 mg by mouth daily.     tamoxifen (NOLVADEX) 20 MG tablet Take 1 tablet (20 mg total) by mouth daily. 90 tablet 3   No current facility-administered medications for this visit.    Allergies  Allergen Reactions   Levaquin [Levofloxacin In D5w] Rash   Sulfa Antibiotics Rash    Social History   Socioeconomic History   Marital status: Married    Spouse name: Not on file   Number of children: 1   Years of education: Not on file   Highest education level: Not on file  Occupational History   Occupation: Retired-Works part time at [employer]  Tobacco Use   Smoking status: Never   Smokeless tobacco: Never  Vaping Use   Vaping status: Never Used  Substance and Sexual Activity   Alcohol use: Never   Drug use: Never   Sexual activity: Not on file  Other Topics Concern   Not on file  Social History Narrative   Not on file   Social Drivers of Health   Financial Resource Strain: Not on file  Food Insecurity: Not on file  Transportation Needs: Not on file  Physical Activity: Not on file  Stress: Not on file  Social Connections: Not on file  Intimate Partner Violence: Not on file    Family History  Problem Relation Age of Onset   Heart attack Mother    Diabetes Mother    Heart attack Father    Diabetes Father    Heart attack Brother    Anuerysm Maternal Grandmother    Heart attack Maternal Grandfather    Heart attack Paternal Grandmother    Heart attack Paternal Grandfather     Review of Systems:  As stated in the HPI and otherwise negative.   BP 132/82   Pulse 78   Ht 5\' 3"  (1.6 m)   Wt 73 kg   SpO2 95%   BMI 28.52 kg/m   Physical Examination: General: Well developed, well nourished, NAD  HEENT: OP clear, mucus membranes moist  SKIN: warm, dry. No rashes. Neuro: No focal deficits   Musculoskeletal: Muscle strength 5/5 all ext  Psychiatric: Mood and affect normal  Neck: No JVD, no carotid bruits, no thyromegaly, no lymphadenopathy.  Lungs:Clear bilaterally, no wheezes, rhonci, crackles Cardiovascular: Regular rate and rhythm. No murmurs, gallops or rubs. Abdomen:Soft. Bowel sounds present. Non-tender.  Extremities: No lower extremity edema. Pulses are 2 + in the bilateral DP/PT.  EKG:  EKG is ordered today. The ekg ordered today demonstrates  EKG Interpretation Date/Time:  Monday September 02 2023 08:01:10 EDT Ventricular Rate:  78 PR Interval:  156 QRS Duration:  84 QT Interval:  394 QTC Calculation: 449 R Axis:   50  Text Interpretation: Normal sinus rhythm Nonspecific ST and T wave abnormality Confirmed by Verne Carrow [phone]) on 09/02/2023 8:02:58 AM    Recent Labs: No results found for requested labs within last 365 days.   Lipid Panel No results found for: "CHOL", "TRIG", "HDL", "CHOLHDL", "VLDL", "LDLCALC", "LDLDIRECT"   Wt Readings from Last 3 Encounters:  09/02/23 73 kg  03/06/23 73.4 kg  03/05/22 73.2 kg     Assessment and Plan:   1. Pre-operative cardiovascular risk assessment: She is feeling well overall. EKG today with sinus and non-specific T wave abnormality. Normal cardiac exam. I will arrange an echo to assess LV function. This will not delay her surgery and does not have be done before her knee surgery. I do not see any need for ischemic testing at this time. We discussed a coronary calcium score given her FH of CAD.   2. HTN: BP is well controlled.    Labs/ tests ordered today include:   Orders Placed This Encounter  Procedures   EKG 12-Lead   ECHOCARDIOGRAM COMPLETE    Disposition:   F/U with me in 12 months    Signed, Verne Carrow, MD, [facility] 09/02/2023 8:28 AM    [facility] [address] [place], [place], [place]  [address] Phone: [phone]; Fax: [phone]

## 2023-09-18 DIAGNOSIS — M25561 Pain in right knee: Secondary | ICD-10-CM | POA: Diagnosis not present

## 2023-09-18 DIAGNOSIS — M25562 Pain in left knee: Secondary | ICD-10-CM | POA: Diagnosis not present

## 2023-09-18 DIAGNOSIS — M1711 Unilateral primary osteoarthritis, right knee: Secondary | ICD-10-CM | POA: Diagnosis not present

## 2023-09-25 ENCOUNTER — Ambulatory Visit (HOSPITAL_COMMUNITY): Attending: Cardiovascular Disease

## 2023-09-25 DIAGNOSIS — I10 Essential (primary) hypertension: Secondary | ICD-10-CM | POA: Diagnosis not present

## 2023-09-25 DIAGNOSIS — Z01810 Encounter for preprocedural cardiovascular examination: Secondary | ICD-10-CM | POA: Diagnosis not present

## 2023-09-25 LAB — ECHOCARDIOGRAM COMPLETE
Area-P 1/2: 4.89 cm2
S' Lateral: 2.5 cm

## 2023-10-03 DIAGNOSIS — G8918 Other acute postprocedural pain: Secondary | ICD-10-CM | POA: Diagnosis not present

## 2023-10-03 DIAGNOSIS — Z96651 Presence of right artificial knee joint: Secondary | ICD-10-CM | POA: Diagnosis not present

## 2023-10-03 DIAGNOSIS — M1711 Unilateral primary osteoarthritis, right knee: Secondary | ICD-10-CM | POA: Diagnosis not present

## 2023-10-03 DIAGNOSIS — M25761 Osteophyte, right knee: Secondary | ICD-10-CM | POA: Diagnosis not present

## 2023-10-07 ENCOUNTER — Other Ambulatory Visit: Payer: Self-pay

## 2023-10-07 ENCOUNTER — Ambulatory Visit (HOSPITAL_COMMUNITY): Attending: Orthopedic Surgery

## 2023-10-07 DIAGNOSIS — R262 Difficulty in walking, not elsewhere classified: Secondary | ICD-10-CM | POA: Diagnosis not present

## 2023-10-07 DIAGNOSIS — M25561 Pain in right knee: Secondary | ICD-10-CM | POA: Insufficient documentation

## 2023-10-07 DIAGNOSIS — M25661 Stiffness of right knee, not elsewhere classified: Secondary | ICD-10-CM | POA: Diagnosis not present

## 2023-10-07 DIAGNOSIS — M6281 Muscle weakness (generalized): Secondary | ICD-10-CM | POA: Diagnosis not present

## 2023-10-07 NOTE — Therapy (Signed)
 OUTPATIENT PHYSICAL THERAPY LOWER EXTREMITY EVALUATION   Patient Name: Amanda Roberts MRN: [id number] DOB:[birth year], 68 y.o., female Today's Date: 10/07/2023  END OF SESSION:  PT End of Session - 10/07/23 1423     Visit Number 1    Number of Visits 12    Date for PT Re-Evaluation 11/22/23    Authorization Type [employer]    Authorization Time Period no auth; no limit    Progress Note Due on Visit 10    PT Start Time 1423    PT Stop Time 1510    PT Time Calculation (min) 47 min    Activity Tolerance Patient limited by pain;Patient tolerated treatment well    Behavior During Therapy WFL for tasks assessed/performed             Past Medical History:  Diagnosis Date   Asthma    Hypertension    PONV (postoperative nausea and vomiting)    Past Surgical History:  Procedure Laterality Date   ABDOMINAL HYSTERECTOMY     BREAST EXCISIONAL BIOPSY     BREAST LUMPECTOMY WITH RADIOACTIVE SEED LOCALIZATION Left 10/23/2019   Procedure: LEFT BREAST LUMPECTOMY WITH RADIOACTIVE SEED LOCALIZATION;  Surgeon: Caralyn Chandler, MD;  Location: [facility];  Service: General;  Laterality: Left;   CESAREAN SECTION     DG GALL BLADDER     FOOT SURGERY     Patient Active Problem List   Diagnosis Date Noted   At high risk for breast cancer 03/03/2020   Atypical ductal hyperplasia of left breast 11/27/2019   Essential hypertension 04/05/2017   Chest pain 03/06/2017   Family history of heart disease 03/06/2017    PCP: Jolynn Needy, PA  REFERRING PROVIDER: Saundra Curl, MD  REFERRING DIAG: rt TKR on 10/03/2023  THERAPY DIAG:  Right knee pain, unspecified chronicity  Difficulty in walking, not elsewhere classified  Stiffness of right knee, not elsewhere classified  Rationale for Evaluation and Treatment: Rehabilitation  ONSET DATE: s/p  10/03/23  SUBJECTIVE:   SUBJECTIVE STATEMENT: S/p right TKA 10/03/23 per Dr. Abigail Abler; arrives with RW and waterproof  bandage in place; TED hose  PERTINENT HISTORY: Here previous for knee pain PAIN:  Are you having pain? Yes: NPRS scale: 3-7/10 Pain location: right knee Pain description: throbbing, tight Aggravating factors: bending, movemenet Relieving factors: ice rest, medication  PRECAUTIONS: None  RED FLAGS: None   WEIGHT BEARING RESTRICTIONS: No  FALLS:  Has patient fallen in last 6 months? Yes. Number of falls 1 prior to surgery   OCCUPATION: retired but works some at [employer]  PLOF: Independent  PATIENT GOALS: walk normally without walker  NEXT MD VISIT: 10/16/23  OBJECTIVE:  Note: Objective measures were completed at Evaluation unless otherwise noted.  DIAGNOSTIC FINDINGS: none  PATIENT SURVEYS:  LEFS next visit  COGNITION: Overall cognitive status: Within functional limits for tasks assessed     SENSATION: WFL  EDEMA:  Normal for this time s/p   PALPATION: General soreness  LOWER EXTREMITY ROM: very guarded  Active ROM Right eval Left eval  Hip flexion    Hip extension    Hip abduction    Hip adduction    Hip internal rotation    Hip external rotation    Knee flexion 32   Knee extension -15   Ankle dorsiflexion Decreased versus left   Ankle plantarflexion    Ankle inversion    Ankle eversion     (Blank rows = not tested)  LOWER EXTREMITY MMT:  MMT Right eval Left eval  Hip flexion 2   Hip extension    Hip abduction    Hip adduction    Hip internal rotation    Hip external rotation    Knee flexion    Knee extension 2 (poor quad set)   Ankle dorsiflexion    Ankle plantarflexion    Ankle inversion    Ankle eversion     (Blank rows = not tested)  FUNCTIONAL TESTS:  5 times sit to stand: 34.98 sec using hands to assist and reliant on left leg 2 minute walk test: 32 ft with RW  GAIT: Distance walked: 32 ft Assistive device utilized: Environmental consultant - 2 wheeled Level of assistance: SBA Comments: slow pace; step to gait                                                                                                                                 TREATMENT DATE: 10/07/22 physical therapy evaluation and HEP     PATIENT EDUCATION:  Education details: Patient educated on exam findings, POC, scope of PT, HEP, and what to expect next visit. Person educated: Patient Education method: Explanation, Demonstration, and Handouts Education comprehension: verbalized understanding, returned demonstration, verbal cues required, and tactile cues required  HOME EXERCISE PROGRAM: Access Code: [email or web address] URL: [email or web address] Date: 10/07/2023 Prepared by: AP - Rehab  Exercises - Supine Ankle Pumps  - 2-3 x daily - 7 x weekly - 2 sets - 10-20 reps - Supine Heel Slide  - 2-3 x daily - 7 x weekly - 3 sets - 10-20 reps - 5 hold - Seated Knee Flexion  - 2-3 x daily - 7 x weekly - 3 sets - 10-20 reps - 10 hold - Seated Long Arc Quad  - 2-3 x daily - 7 x weekly - 3 sets - 10-20 reps - Supine Quad Set  - 2-3 x daily - 7 x weekly - 3 sets - 10 reps - Supine Heel Slide with Strap  - 2-3 x daily - 7 x weekly - 3 sets - 10 reps - Seated Knee Extension AAROM  - 2-3 x daily - 7 x weekly - 3 sets - 10 reps - Supine Knee Extension Stretch on Towel Roll  - 2-3 x daily - 7 x weekly - 3 sets - 10 reps  ASSESSMENT:  CLINICAL IMPRESSION: Patient is a 68 y.o. female who was seen today for physical therapy evaluation and treatment for s/p Right TKA.  Patient demonstrates muscle weakness, reduced ROM, and fascial restrictions which are likely contributing to symptoms of pain and are negatively impacting patient ability to perform ADLs and functional mobility tasks. Patient will benefit from skilled physical therapy services to address these deficits to reduce pain and improve level of function with ADLs and functional mobility tasks.   OBJECTIVE IMPAIRMENTS: Abnormal gait, decreased activity tolerance, decreased mobility, difficulty  walking, decreased ROM, decreased strength, increased  fascial restrictions, and pain.   ACTIVITY LIMITATIONS: carrying, lifting, bending, sitting, standing, squatting, sleeping, stairs, transfers, bed mobility, locomotion level, and caring for others  PARTICIPATION LIMITATIONS: meal prep, cleaning, laundry, driving, shopping, community activity, and yard work  [facility] POTENTIAL: Good  CLINICAL DECISION MAKING: Evolving/moderate complexity  EVALUATION COMPLEXITY: Moderate   GOALS: Goals reviewed with patient? No  SHORT TERM GOALS: Target date: 10/28/23 patient will be independent with initial HEP   Baseline: Goal status: INITIAL  2.  Patient will report 50% improvement overall  Baseline:  Goal status: INITIAL  3.  Patient will increase right knee flexion to 90 to prepare for step navigation Baseline: 32 Goal status: INITIAL   LONG TERM GOALS: Target date: 11/22/23  Patient will be independent in self management strategies to improve quality of life and functional outcomes.  Baseline:  Goal status: INITIAL  2.  Patient will report 75% improvement overall  Baseline:  Goal status: INITIAL  3.  Patient will improve LEFS score by 10  points to demonstrate improved perceived function   Baseline: next visit Goal status: INITIAL  4.   Patient will increase right leg MMT's to 5/5 to allow navigation of steps without gait deviation or loss of balance  Baseline: see above Goal status: INITIAL  5.  Patient will increase knee mobility to -2 to 120 to promote normal navigation of steps; step over step pattern  Baseline: see above Goal status: INITIAL   PLAN:  PT FREQUENCY: 2x/week  PT DURATION: 6 weeks  PLANNED INTERVENTIONS: 97164- PT Re-evaluation, 97110-Therapeutic exercises, 97530- Therapeutic activity, 97112- Neuromuscular re-education, 97535- Self Care, 16109- Manual therapy, [phone]- Gait training, [phone]- Orthotic Fit/training, [phone]- Canalith repositioning,  [id number]- Aquatic Therapy, [phone]- Splinting, Patient/Family education, Balance training, Stair training, Taping, Dry Needling, Joint mobilization, Joint manipulation, Spinal manipulation, Spinal mobilization, Scar mobilization, and DME instructions.   PLAN FOR NEXT SESSION: Review HEP and goals; LEFS next visit; progress knee mobility and then strength; gait training   3:06 PM, 10/07/23 Arianie Couse Small Madailein Londo MPT [place] [facility] [place] [phone] [phone]

## 2023-10-10 ENCOUNTER — Ambulatory Visit (HOSPITAL_COMMUNITY)

## 2023-10-10 DIAGNOSIS — M25661 Stiffness of right knee, not elsewhere classified: Secondary | ICD-10-CM

## 2023-10-10 DIAGNOSIS — M25561 Pain in right knee: Secondary | ICD-10-CM | POA: Diagnosis not present

## 2023-10-10 DIAGNOSIS — M6281 Muscle weakness (generalized): Secondary | ICD-10-CM

## 2023-10-10 DIAGNOSIS — R262 Difficulty in walking, not elsewhere classified: Secondary | ICD-10-CM

## 2023-10-10 NOTE — Therapy (Signed)
 OUTPATIENT PHYSICAL THERAPY LOWER EXTREMITY TREATMENT   Patient Name: Amanda Roberts MRN: [id number] DOB:[birth year], 68 y.o., female Today's Date: 10/10/2023  END OF SESSION:  PT End of Session - 10/10/23 1058     Visit Number 2    Number of Visits 12    Date for PT Re-Evaluation 11/22/23    Authorization Type [employer]    Authorization Time Period no auth; no limit    Progress Note Due on Visit 10    PT Start Time 1100    PT Stop Time 1140    PT Time Calculation (min) 40 min    Activity Tolerance Patient limited by pain;Patient tolerated treatment well    Behavior During Therapy WFL for tasks assessed/performed             Past Medical History:  Diagnosis Date   Asthma    Hypertension    PONV (postoperative nausea and vomiting)    Past Surgical History:  Procedure Laterality Date   ABDOMINAL HYSTERECTOMY     BREAST EXCISIONAL BIOPSY     BREAST LUMPECTOMY WITH RADIOACTIVE SEED LOCALIZATION Left 10/23/2019   Procedure: LEFT BREAST LUMPECTOMY WITH RADIOACTIVE SEED LOCALIZATION;  Surgeon: Caralyn Chandler, MD;  Location: [facility];  Service: General;  Laterality: Left;   CESAREAN SECTION     DG GALL BLADDER     FOOT SURGERY     Patient Active Problem List   Diagnosis Date Noted   At high risk for breast cancer 03/03/2020   Atypical ductal hyperplasia of left breast 11/27/2019   Essential hypertension 04/05/2017   Chest pain 03/06/2017   Family history of heart disease 03/06/2017    PCP: Jolynn Needy, PA  REFERRING PROVIDER: Saundra Curl, MD  REFERRING DIAG: rt TKR on 10/03/2023  THERAPY DIAG:  Right knee pain, unspecified chronicity  Difficulty in walking, not elsewhere classified  Stiffness of right knee, not elsewhere classified  Muscle weakness (generalized)  Rationale for Evaluation and Treatment: Rehabilitation  ONSET DATE: s/p  10/03/23  SUBJECTIVE:   SUBJECTIVE STATEMENT: "My calf is so tight" reports having  a hard time doing her HEP; still using CPM  EVAL:S/p right TKA 10/03/23 per Dr. Abigail Abler; arrives with RW and waterproof bandage in place; TED hose  PERTINENT HISTORY: Here previous for knee pain PAIN:  Are you having pain? Yes: NPRS scale: 3-7/10 Pain location: right knee Pain description: throbbing, tight Aggravating factors: bending, movemenet Relieving factors: ice rest, medication  PRECAUTIONS: None  RED FLAGS: None   WEIGHT BEARING RESTRICTIONS: No  FALLS:  Has patient fallen in last 6 months? Yes. Number of falls 1 prior to surgery   OCCUPATION: retired but works some at [employer]  PLOF: Independent  PATIENT GOALS: walk normally without walker  NEXT MD VISIT: 10/16/23  OBJECTIVE:  Note: Objective measures were completed at Evaluation unless otherwise noted.  DIAGNOSTIC FINDINGS: none  PATIENT SURVEYS:  LEFS next visit 10/10/23 LEFS 8/80 10%  COGNITION: Overall cognitive status: Within functional limits for tasks assessed     SENSATION: WFL  EDEMA:  Normal for this time s/p   PALPATION: General soreness  LOWER EXTREMITY ROM: very guarded  Active ROM Right eval Left eval Right 10/10/23  Hip flexion     Hip extension     Hip abduction     Hip adduction     Hip internal rotation     Hip external rotation     Knee flexion 32  65 AAROM  Knee extension -15    Ankle dorsiflexion Decreased versus left    Ankle plantarflexion     Ankle inversion     Ankle eversion      (Blank rows = not tested)  LOWER EXTREMITY MMT:  MMT Right eval Left eval  Hip flexion 2   Hip extension    Hip abduction    Hip adduction    Hip internal rotation    Hip external rotation    Knee flexion    Knee extension 2 (poor quad set)   Ankle dorsiflexion    Ankle plantarflexion    Ankle inversion    Ankle eversion     (Blank rows = not tested)  FUNCTIONAL TESTS:  5 times sit to stand: 34.98 sec using hands to assist and reliant on left leg 2 minute  walk test: 32 ft with RW  GAIT: Distance walked: 32 ft Assistive device utilized: Environmental consultant - 2 wheeled Level of assistance: SBA Comments: slow pace; step to gait                                                                                                                                TREATMENT DATE: 10/10/23  LEFS 8/80 Review of HEP and goals  Supine: STM followed by manual ROM to right knee and calf to decrease pain and improve joint mobility and soft tissue extensibility x 15' AAROM right knee flexion 65 degrees Quad sets 5" hold x 10 (poor quad set) Trial of SAQ's AAROM x 5 Heel slides with strap x 10 Seated heel/toe raises Seated calf stretch 5 x 20" Standing calf stretch 2 x 10"      10/07/22 physical therapy evaluation and HEP     PATIENT EDUCATION:  Education details: Patient educated on exam findings, POC, scope of PT, HEP, and what to expect next visit. Person educated: Patient Education method: Explanation, Demonstration, and Handouts Education comprehension: verbalized understanding, returned demonstration, verbal cues required, and tactile cues required  HOME EXERCISE PROGRAM: Access Code: [email or web address] URL: [email or web address] Date: 10/07/2023 Prepared by: AP - Rehab  Exercises - Supine Ankle Pumps  - 2-3 x daily - 7 x weekly - 2 sets - 10-20 reps - Supine Heel Slide  - 2-3 x daily - 7 x weekly - 3 sets - 10-20 reps - 5 hold - Seated Knee Flexion  - 2-3 x daily - 7 x weekly - 3 sets - 10-20 reps - 10 hold - Seated Long Arc Quad  - 2-3 x daily - 7 x weekly - 3 sets - 10-20 reps - Supine Quad Set  - 2-3 x daily - 7 x weekly - 3 sets - 10 reps - Supine Heel Slide with Strap  - 2-3 x daily - 7 x weekly - 3 sets - 10 reps - Seated Knee Extension AAROM  - 2-3 x daily - 7 x weekly - 3 sets - 10 reps - Supine Knee Extension  Stretch on Towel Roll  - 2-3 x daily - 7 x weekly - 3 sets - 10 reps  ASSESSMENT:  CLINICAL IMPRESSION: Today's session  started with functional survey LEFS with a score of 8/80 or 10%.  Review of HEP and goals.  Patient verbalizes agreement with set rehab goals.  STM to right knee and calf to decrease pain and improve mobility; noted improvement with knee flexion.  Patient with limited dorsiflexion right ankle and walks with a step to gait so added calf stretches today and updated HEP.  Patient reports feeling better after treatment.  Patient will benefit from continued skilled therapy services to address deficits and promote return to optimal function.      Eval: Patient is a 68 y.o. female who was seen today for physical therapy evaluation and treatment for s/p Right TKA.  Patient demonstrates muscle weakness, reduced ROM, and fascial restrictions which are likely contributing to symptoms of pain and are negatively impacting patient ability to perform ADLs and functional mobility tasks. Patient will benefit from skilled physical therapy services to address these deficits to reduce pain and improve level of function with ADLs and functional mobility tasks.   OBJECTIVE IMPAIRMENTS: Abnormal gait, decreased activity tolerance, decreased mobility, difficulty walking, decreased ROM, decreased strength, increased fascial restrictions, and pain.   ACTIVITY LIMITATIONS: carrying, lifting, bending, sitting, standing, squatting, sleeping, stairs, transfers, bed mobility, locomotion level, and caring for others  PARTICIPATION LIMITATIONS: meal prep, cleaning, laundry, driving, shopping, community activity, and yard work  [facility] POTENTIAL: Good  CLINICAL DECISION MAKING: Evolving/moderate complexity  EVALUATION COMPLEXITY: Moderate   GOALS: Goals reviewed with patient? No  SHORT TERM GOALS: Target date: 10/28/23 patient will be independent with initial HEP   Baseline: Goal status: in progress  2.  Patient will report 50% improvement overall  Baseline:  Goal status: in progress   3.  Patient will increase right  knee flexion to 90 to prepare for step navigation Baseline: 32 Goal status: in progress    LONG TERM GOALS: Target date: 11/22/23  Patient will be independent in self management strategies to improve quality of life and functional outcomes.  Baseline:  Goal status: in progress   2.  Patient will report 75% improvement overall  Baseline:  Goal status: in progress   3.  Patient will improve LEFS score by 10  points to demonstrate improved perceived function   Baseline: 8/80 Goal status: in progress   4.   Patient will increase right leg MMT's to 5/5 to allow navigation of steps without gait deviation or loss of balance  Baseline: see above Goal status: in progress   5.  Patient will increase knee mobility to -2 to 120 to promote normal navigation of steps; step over step pattern  Baseline: see above Goal status: in progress   PLAN:  PT FREQUENCY: 2x/week  PT DURATION: 6 weeks  PLANNED INTERVENTIONS: 97164- PT Re-evaluation, 97110-Therapeutic exercises, 97530- Therapeutic activity, 97112- Neuromuscular re-education, 97535- Self Care, 40981- Manual therapy, [phone]- Gait training, [phone]- Orthotic Fit/training, [phone]- Canalith repositioning, [id number]- Aquatic Therapy, [phone]- Splinting, Patient/Family education, Balance training, Stair training, Taping, Dry Needling, Joint mobilization, Joint manipulation, Spinal manipulation, Spinal mobilization, Scar mobilization, and DME instructions.   PLAN FOR NEXT SESSION:  progress knee mobility and then strength; gait training   11:51 AM, 10/10/23 Wai Minotti Small Morene Cecilio MPT [facility] [place] [phone] Ph:[phone]

## 2023-10-15 ENCOUNTER — Ambulatory Visit (HOSPITAL_COMMUNITY)

## 2023-10-15 DIAGNOSIS — M25661 Stiffness of right knee, not elsewhere classified: Secondary | ICD-10-CM

## 2023-10-15 DIAGNOSIS — M25561 Pain in right knee: Secondary | ICD-10-CM | POA: Diagnosis not present

## 2023-10-15 DIAGNOSIS — M6281 Muscle weakness (generalized): Secondary | ICD-10-CM

## 2023-10-15 DIAGNOSIS — R262 Difficulty in walking, not elsewhere classified: Secondary | ICD-10-CM

## 2023-10-15 NOTE — Therapy (Signed)
 OUTPATIENT PHYSICAL THERAPY LOWER EXTREMITY TREATMENT   Patient Name: Amanda Roberts MRN: [id number] DOB:06/19/1955, 68 y.o., female Today's Date: 10/15/2023  END OF SESSION:  PT End of Session - 10/15/23 1301     Visit Number 3    Number of Visits 12    Date for PT Re-Evaluation 11/22/23    Authorization Type [employer]    Authorization Time Period no auth; no limit    Progress Note Due on Visit 10    PT Start Time 1301    PT Stop Time 1341    PT Time Calculation (min) 40 min    Activity Tolerance Patient limited by pain;Patient tolerated treatment well    Behavior During Therapy WFL for tasks assessed/performed             Past Medical History:  Diagnosis Date   Asthma    Hypertension    PONV (postoperative nausea and vomiting)    Past Surgical History:  Procedure Laterality Date   ABDOMINAL HYSTERECTOMY     BREAST EXCISIONAL BIOPSY     BREAST LUMPECTOMY WITH RADIOACTIVE SEED LOCALIZATION Left 10/23/2019   Procedure: LEFT BREAST LUMPECTOMY WITH RADIOACTIVE SEED LOCALIZATION;  Surgeon: Caralyn Chandler, MD;  Location: [facility];  Service: General;  Laterality: Left;   CESAREAN SECTION     DG GALL BLADDER     FOOT SURGERY     Patient Active Problem List   Diagnosis Date Noted   At high risk for breast cancer 03/03/2020   Atypical ductal hyperplasia of left breast 11/27/2019   Essential hypertension 04/05/2017   Chest pain 03/06/2017   Family history of heart disease 03/06/2017    PCP: Jolynn Needy, PA  REFERRING PROVIDER: Saundra Curl, MD  REFERRING DIAG: rt TKR on 10/03/2023  THERAPY DIAG:  Right knee pain, unspecified chronicity  Difficulty in walking, not elsewhere classified  Stiffness of right knee, not elsewhere classified  Muscle weakness (generalized)  Rationale for Evaluation and Treatment: Rehabilitation  ONSET DATE: s/p  10/03/23  SUBJECTIVE:   SUBJECTIVE STATEMENT: Calf is feeling but is still really  sore in her thigh; also reports weakness and has not been able to eat very well.  Sees MD tomorrow; 5/10 on pain scale  EVAL:S/p right TKA 10/03/23 per Dr. Abigail Abler; arrives with RW and waterproof bandage in place; TED hose  PERTINENT HISTORY: Here previous for knee pain PAIN:  Are you having pain? Yes: NPRS scale: 3-7/10 Pain location: right knee Pain description: throbbing, tight Aggravating factors: bending, movemenet Relieving factors: ice rest, medication  PRECAUTIONS: None  RED FLAGS: None   WEIGHT BEARING RESTRICTIONS: No  FALLS:  Has patient fallen in last 6 months? Yes. Number of falls 1 prior to surgery   OCCUPATION: retired but works some at [employer]  PLOF: Independent  PATIENT GOALS: walk normally without walker  NEXT MD VISIT: 10/16/23  OBJECTIVE:  Note: Objective measures were completed at Evaluation unless otherwise noted.  DIAGNOSTIC FINDINGS: none  PATIENT SURVEYS:  LEFS next visit 10/10/23 LEFS 8/80 10%  COGNITION: Overall cognitive status: Within functional limits for tasks assessed     SENSATION: WFL  EDEMA:  Normal for this time s/p   PALPATION: General soreness  LOWER EXTREMITY ROM: very guarded  Active ROM Right eval Left eval Right 10/10/23 Right 10/15/23  Hip flexion      Hip extension      Hip abduction      Hip adduction  Hip internal rotation      Hip external rotation      Knee flexion 32  65 AAROM 74 AAROM  Knee extension -15   -8  Ankle dorsiflexion Decreased versus left     Ankle plantarflexion      Ankle inversion      Ankle eversion       (Blank rows = not tested)  LOWER EXTREMITY MMT:  MMT Right eval Left eval  Hip flexion 2   Hip extension    Hip abduction    Hip adduction    Hip internal rotation    Hip external rotation    Knee flexion    Knee extension 2 (poor quad set)   Ankle dorsiflexion    Ankle plantarflexion    Ankle inversion    Ankle eversion     (Blank rows = not  tested)  FUNCTIONAL TESTS:  5 times sit to stand: 34.98 sec using hands to assist and reliant on left leg 2 minute walk test: 32 ft with RW  GAIT: Distance walked: 32 ft Assistive device utilized: Environmental consultant - 2 wheeled Level of assistance: SBA Comments: slow pace; step to gait                                                                                                                                TREATMENT DATE: 10/15/23 Supine: STM followed by manual ROM to right knee and calf to decrease pain and improve joint mobility and soft tissue extensibility x 15' Supine: Quad sets 5" x 10 Ankle pumps x 15 SAQ's on 1/2 foam roll with assist Seated AAROM right knee flexion x 10 followed by dangle Seated heel slide x 10 Standing Marching x 5 each  10/10/23  LEFS 8/80 Review of HEP and goals  Supine: STM followed by manual ROM to right knee and calf to decrease pain and improve joint mobility and soft tissue extensibility x 15' AAROM right knee flexion 65 degrees Quad sets 5" hold x 10 (poor quad set) Trial of SAQ's AAROM x 5 Heel slides with strap x 10 Seated heel/toe raises Seated calf stretch 5 x 20" Standing calf stretch 2 x 10"      10/07/22 physical therapy evaluation and HEP     PATIENT EDUCATION:  Education details: Patient educated on exam findings, POC, scope of PT, HEP, and what to expect next visit. Person educated: Patient Education method: Explanation, Demonstration, and Handouts Education comprehension: verbalized understanding, returned demonstration, verbal cues required, and tactile cues required  HOME EXERCISE PROGRAM: Access Code: [email or web address] URL: [email or web address] Date: 10/07/2023 Prepared by: AP - Rehab  Exercises - Supine Ankle Pumps  - 2-3 x daily - 7 x weekly - 2 sets - 10-20 reps - Supine Heel Slide  - 2-3 x daily - 7 x weekly - 3 sets - 10-20 reps - 5 hold - Seated Knee Flexion  - 2-3 x daily - 7 x weekly -  3 sets - 10-20  reps - 10 hold - Seated Long Arc Quad  - 2-3 x daily - 7 x weekly - 3 sets - 10-20 reps - Supine Quad Set  - 2-3 x daily - 7 x weekly - 3 sets - 10 reps - Supine Heel Slide with Strap  - 2-3 x daily - 7 x weekly - 3 sets - 10 reps - Seated Knee Extension AAROM  - 2-3 x daily - 7 x weekly - 3 sets - 10 reps - Supine Knee Extension Stretch on Towel Roll  - 2-3 x daily - 7 x weekly - 3 sets - 10 reps  ASSESSMENT:  CLINICAL IMPRESSION: STM to right knee and calf to decrease pain and improve mobility; noted improvement with knee flexion and extension. Continues with poor quad set; step to gait with RW although encouraged her to try to step past her right leg when in stance phase.   Patient reports feeling better after treatment.  Patient will benefit from continued skilled therapy services to address deficits and promote return to optimal function.      Eval: Patient is a 68 y.o. female who was seen today for physical therapy evaluation and treatment for s/p Right TKA.  Patient demonstrates muscle weakness, reduced ROM, and fascial restrictions which are likely contributing to symptoms of pain and are negatively impacting patient ability to perform ADLs and functional mobility tasks. Patient will benefit from skilled physical therapy services to address these deficits to reduce pain and improve level of function with ADLs and functional mobility tasks.   OBJECTIVE IMPAIRMENTS: Abnormal gait, decreased activity tolerance, decreased mobility, difficulty walking, decreased ROM, decreased strength, increased fascial restrictions, and pain.   ACTIVITY LIMITATIONS: carrying, lifting, bending, sitting, standing, squatting, sleeping, stairs, transfers, bed mobility, locomotion level, and caring for others  PARTICIPATION LIMITATIONS: meal prep, cleaning, laundry, driving, shopping, community activity, and yard work  [facility] POTENTIAL: Good  CLINICAL DECISION MAKING: Evolving/moderate complexity  EVALUATION  COMPLEXITY: Moderate   GOALS: Goals reviewed with patient? No  SHORT TERM GOALS: Target date: 10/28/23 patient will be independent with initial HEP   Baseline: Goal status: in progress  2.  Patient will report 50% improvement overall  Baseline:  Goal status: in progress   3.  Patient will increase right knee flexion to 90 to prepare for step navigation Baseline: 32 Goal status: in progress    LONG TERM GOALS: Target date: 11/22/23  Patient will be independent in self management strategies to improve quality of life and functional outcomes.  Baseline:  Goal status: in progress   2.  Patient will report 75% improvement overall  Baseline:  Goal status: in progress   3.  Patient will improve LEFS score by 10  points to demonstrate improved perceived function   Baseline: 8/80 Goal status: in progress   4.   Patient will increase right leg MMT's to 5/5 to allow navigation of steps without gait deviation or loss of balance  Baseline: see above Goal status: in progress   5.  Patient will increase knee mobility to -2 to 120 to promote normal navigation of steps; step over step pattern  Baseline: see above Goal status: in progress   PLAN:  PT FREQUENCY: 2x/week  PT DURATION: 6 weeks  PLANNED INTERVENTIONS: 97164- PT Re-evaluation, 97110-Therapeutic exercises, 97530- Therapeutic activity, 97112- Neuromuscular re-education, 97535- Self Care, 16109- Manual therapy, [id number]- Gait training, [phone]- Orthotic Fit/training, [phone]- Canalith repositioning, [id number]- Aquatic Therapy, [phone]- Splinting, Patient/Family education, Balance training,  Stair training, Taping, Dry Needling, Joint mobilization, Joint manipulation, Spinal manipulation, Spinal mobilization, Scar mobilization, and DME instructions.   PLAN FOR NEXT SESSION:  progress knee mobility and then strength; gait training; sees MD tomorrow   1:46 PM, 10/15/23 Jailine Lieder Small [place] MPT [facility]   [phone]

## 2023-10-16 DIAGNOSIS — M1711 Unilateral primary osteoarthritis, right knee: Secondary | ICD-10-CM | POA: Diagnosis not present

## 2023-10-17 ENCOUNTER — Encounter (HOSPITAL_COMMUNITY): Payer: Self-pay

## 2023-10-17 ENCOUNTER — Ambulatory Visit (HOSPITAL_COMMUNITY): Attending: Orthopedic Surgery

## 2023-10-17 DIAGNOSIS — M6281 Muscle weakness (generalized): Secondary | ICD-10-CM | POA: Diagnosis not present

## 2023-10-17 DIAGNOSIS — R262 Difficulty in walking, not elsewhere classified: Secondary | ICD-10-CM | POA: Diagnosis not present

## 2023-10-17 DIAGNOSIS — M25661 Stiffness of right knee, not elsewhere classified: Secondary | ICD-10-CM | POA: Insufficient documentation

## 2023-10-17 DIAGNOSIS — M25561 Pain in right knee: Secondary | ICD-10-CM | POA: Diagnosis not present

## 2023-10-17 NOTE — Therapy (Signed)
 OUTPATIENT PHYSICAL THERAPY LOWER EXTREMITY TREATMENT   Patient Name: Amanda Roberts MRN: [id number] DOB:12-16-1955, 68 y.o., female Today's Date: 10/17/2023  END OF SESSION:  PT End of Session - 10/17/23 1018     Visit Number 4    Number of Visits 12    Date for PT Re-Evaluation 11/22/23    Authorization Type [employer]    Authorization Time Period no auth; no limit    Progress Note Due on Visit 10    PT Start Time 1020    PT Stop Time 1101    PT Time Calculation (min) 41 min    Activity Tolerance Patient limited by pain;Patient tolerated treatment well    Behavior During Therapy WFL for tasks assessed/performed             Past Medical History:  Diagnosis Date   Asthma    Hypertension    PONV (postoperative nausea and vomiting)    Past Surgical History:  Procedure Laterality Date   ABDOMINAL HYSTERECTOMY     BREAST EXCISIONAL BIOPSY     BREAST LUMPECTOMY WITH RADIOACTIVE SEED LOCALIZATION Left 10/23/2019   Procedure: LEFT BREAST LUMPECTOMY WITH RADIOACTIVE SEED LOCALIZATION;  Surgeon: Caralyn Chandler, MD;  Location: [facility];  Service: General;  Laterality: Left;   CESAREAN SECTION     DG GALL BLADDER     FOOT SURGERY     Patient Active Problem List   Diagnosis Date Noted   At high risk for breast cancer 03/03/2020   Atypical ductal hyperplasia of left breast 11/27/2019   Essential hypertension 04/05/2017   Chest pain 03/06/2017   Family history of heart disease 03/06/2017    PCP: Jolynn Needy, PA  REFERRING PROVIDER: Saundra Curl, MD  REFERRING DIAG: rt TKR on 10/03/2023  THERAPY DIAG:  Right knee pain, unspecified chronicity  Difficulty in walking, not elsewhere classified  Stiffness of right knee, not elsewhere classified  Muscle weakness (generalized)  Rationale for Evaluation and Treatment: Rehabilitation  ONSET DATE: s/p  10/03/23  SUBJECTIVE:   SUBJECTIVE STATEMENT: Pt reports 5/10 tightness/pain  throughout R knee. She got bandages off yesterday. Reports she was able to dress herself and perform her exercises this morning which was the first time she's really done her HEP.  EVAL:S/p right TKA 10/03/23 per Dr. Abigail Abler; arrives with RW and waterproof bandage in place; TED hose  PERTINENT HISTORY: Here previous for knee pain PAIN:  Are you having pain? Yes: NPRS scale: 3-7/10 Pain location: right knee Pain description: throbbing, tight Aggravating factors: bending, movemenet Relieving factors: ice rest, medication  PRECAUTIONS: None  RED FLAGS: None   WEIGHT BEARING RESTRICTIONS: No  FALLS:  Has patient fallen in last 6 months? Yes. Number of falls 1 prior to surgery   OCCUPATION: retired but works some at [employer]  PLOF: Independent  PATIENT GOALS: walk normally without walker  NEXT MD VISIT: 10/16/23  OBJECTIVE:  Note: Objective measures were completed at Evaluation unless otherwise noted.  DIAGNOSTIC FINDINGS: none  PATIENT SURVEYS:  LEFS next visit 10/10/23 LEFS 8/80 10%  COGNITION: Overall cognitive status: Within functional limits for tasks assessed     SENSATION: WFL  EDEMA:  Normal for this time s/p   PALPATION: General soreness  LOWER EXTREMITY ROM: very guarded  Active ROM Right eval Left eval Right 10/10/23 Right 10/15/23 Right  10/17/23  Hip flexion       Hip extension       Hip abduction  Hip adduction       Hip internal rotation       Hip external rotation       Knee flexion 32  65 AAROM 74 AAROM 75 AAROM  Knee extension -15   -8 -10  Ankle dorsiflexion Decreased versus left      Ankle plantarflexion       Ankle inversion       Ankle eversion        (Blank rows = not tested)  LOWER EXTREMITY MMT:  MMT Right eval Left eval  Hip flexion 2   Hip extension    Hip abduction    Hip adduction    Hip internal rotation    Hip external rotation    Knee flexion    Knee extension 2 (poor quad set)   Ankle  dorsiflexion    Ankle plantarflexion    Ankle inversion    Ankle eversion     (Blank rows = not tested)  FUNCTIONAL TESTS:  5 times sit to stand: 34.98 sec using hands to assist and reliant on left leg 2 minute walk test: 32 ft with RW  GAIT: Distance walked: 32 ft Assistive device utilized: Environmental consultant - 2 wheeled Level of assistance: SBA Comments: slow pace; step to gait                                                                                                                                TREATMENT DATE: 10/17/23: -Seated Heel Slides, 10x, AAROM with towel under foot and use of LLE -Supine Quad sets, 1/2 foam roll under knee, 5" holds, 2x10 -Supine SAQ, full foam roll under knee, little foot clearance, therapist muscle tapping to help engage quad with some improvement -Seated LAQ, 10x, very little foot clearance from floor  -Supine Glute Set, 5" holds, 2x10 -ROM measurement -STM to posterior knee and calf musculature, 8' -Very gentle massage around edges of scar  10/15/23 Supine: STM followed by manual ROM to right knee and calf to decrease pain and improve joint mobility and soft tissue extensibility x 15' Supine: Quad sets 5" x 10 Ankle pumps x 15 SAQ's on 1/2 foam roll with assist Seated AAROM right knee flexion x 10 followed by dangle Seated heel slide x 10 Standing Marching x 5 each  10/10/23  LEFS 8/80 Review of HEP and goals  Supine: STM followed by manual ROM to right knee and calf to decrease pain and improve joint mobility and soft tissue extensibility x 15' AAROM right knee flexion 65 degrees Quad sets 5" hold x 10 (poor quad set) Trial of SAQ's AAROM x 5 Heel slides with strap x 10 Seated heel/toe raises Seated calf stretch 5 x 20" Standing calf stretch 2 x 10"  10/07/22 physical therapy evaluation and HEP     PATIENT EDUCATION:  Education details: Patient educated on exam findings, POC, scope of PT, HEP, and what to expect next visit. Person  educated: Patient Education method: Explanation, Demonstration, and Handouts Education comprehension: verbalized understanding, returned demonstration, verbal cues required, and tactile cues required  HOME EXERCISE PROGRAM: Access Code: [email or web address] URL: [email or web address] Date: 10/07/2023 Prepared by: AP - Rehab  Exercises - Supine Ankle Pumps  - 2-3 x daily - 7 x weekly - 2 sets - 10-20 reps - Supine Heel Slide  - 2-3 x daily - 7 x weekly - 3 sets - 10-20 reps - 5 hold - Seated Knee Flexion  - 2-3 x daily - 7 x weekly - 3 sets - 10-20 reps - 10 hold - Seated Long Arc Quad  - 2-3 x daily - 7 x weekly - 3 sets - 10-20 reps - Supine Quad Set  - 2-3 x daily - 7 x weekly - 3 sets - 10 reps - Supine Heel Slide with Strap  - 2-3 x daily - 7 x weekly - 3 sets - 10 reps - Seated Knee Extension AAROM  - 2-3 x daily - 7 x weekly - 3 sets - 10 reps - Supine Knee Extension Stretch on Towel Roll  - 2-3 x daily - 7 x weekly - 3 sets - 10 reps  ASSESSMENT:  CLINICAL IMPRESSION: Began session with seated knee mobility exercises. Patient demo ability to perform LAQ with little foot clearance initially. Improvement noted with continued reps. Verbal and tactile cueing to limit posterior lean increases challenge but pt with improvement with inc reps. Continued session with quad sets, SAQ, glute sets, and ROM measurement. No gain in ROM since last visit. Therapist educated on importance of HEP compliance at home to see improvement, elevating leg and ankle pumps to aid in LE swelling,  passive knee extension a couple times a day, 5' at a time, to help with knee ext ROM, and very light/gentle massage around scar to prevent scar tissue formation. Ended with STM, with pt reporting less pain during gait. Patient will benefit from continued skilled therapy services to address deficits and promote return to optimal function.   Eval: Patient is a 68 y.o. female who was seen today for physical therapy  evaluation and treatment for s/p Right TKA.  Patient demonstrates muscle weakness, reduced ROM, and fascial restrictions which are likely contributing to symptoms of pain and are negatively impacting patient ability to perform ADLs and functional mobility tasks. Patient will benefit from skilled physical therapy services to address these deficits to reduce pain and improve level of function with ADLs and functional mobility tasks.   OBJECTIVE IMPAIRMENTS: Abnormal gait, decreased activity tolerance, decreased mobility, difficulty walking, decreased ROM, decreased strength, increased fascial restrictions, and pain.   ACTIVITY LIMITATIONS: carrying, lifting, bending, sitting, standing, squatting, sleeping, stairs, transfers, bed mobility, locomotion level, and caring for others  PARTICIPATION LIMITATIONS: meal prep, cleaning, laundry, driving, shopping, community activity, and yard work  [facility] POTENTIAL: Good  CLINICAL DECISION MAKING: Evolving/moderate complexity  EVALUATION COMPLEXITY: Moderate   GOALS: Goals reviewed with patient? No  SHORT TERM GOALS: Target date: 10/28/23 patient will be independent with initial HEP   Baseline: Goal status: in progress  2.  Patient will report 50% improvement overall  Baseline:  Goal status: in progress   3.  Patient will increase right knee flexion to 90 to prepare for step navigation Baseline: 32 Goal status: in progress    LONG TERM GOALS: Target date: 11/22/23  Patient will be independent in self management strategies to improve quality of life and functional outcomes.  Baseline:  Goal status: in progress  2.  Patient will report 75% improvement overall  Baseline:  Goal status: in progress   3.  Patient will improve LEFS score by 10  points to demonstrate improved perceived function   Baseline: 8/80 Goal status: in progress   4.   Patient will increase right leg MMT's to 5/5 to allow navigation of steps without gait  deviation or loss of balance  Baseline: see above Goal status: in progress   5.  Patient will increase knee mobility to -2 to 120 to promote normal navigation of steps; step over step pattern  Baseline: see above Goal status: in progress   PLAN:  PT FREQUENCY: 2x/week  PT DURATION: 6 weeks  PLANNED INTERVENTIONS: 97164- PT Re-evaluation, 97110-Therapeutic exercises, 97530- Therapeutic activity, 97112- Neuromuscular re-education, 97535- Self Care, 78295- Manual therapy, [phone]- Gait training, [phone]- Orthotic Fit/training, [phone]- Canalith repositioning, [id number]- Aquatic Therapy, [phone]- Splinting, Patient/Family education, Balance training, Stair training, Taping, Dry Needling, Joint mobilization, Joint manipulation, Spinal manipulation, Spinal mobilization, Scar mobilization, and DME instructions.   PLAN FOR NEXT SESSION:  progress knee mobility and then strength; gait training   11:21 AM, 10/17/23 Marysue Sola, PT, DPT [facility] [place]

## 2023-10-22 ENCOUNTER — Ambulatory Visit (HOSPITAL_COMMUNITY)

## 2023-10-22 DIAGNOSIS — M6281 Muscle weakness (generalized): Secondary | ICD-10-CM

## 2023-10-22 DIAGNOSIS — R262 Difficulty in walking, not elsewhere classified: Secondary | ICD-10-CM

## 2023-10-22 DIAGNOSIS — M25561 Pain in right knee: Secondary | ICD-10-CM

## 2023-10-22 DIAGNOSIS — M25661 Stiffness of right knee, not elsewhere classified: Secondary | ICD-10-CM

## 2023-10-22 NOTE — Therapy (Signed)
 OUTPATIENT PHYSICAL THERAPY LOWER EXTREMITY TREATMENT   Patient Name: Amanda Roberts MRN: [id number] DOB:05/09/1956, 68 y.o., female Today's Date: 10/22/2023  END OF SESSION:  PT End of Session - 10/22/23 1126     Visit Number 5    Number of Visits 12    Date for PT Re-Evaluation 11/22/23    Authorization Type [employer]    Authorization Time Period no auth; no limit    Progress Note Due on Visit 10    PT Start Time 1105    PT Stop Time 1145    PT Time Calculation (min) 40 min    Activity Tolerance Patient limited by pain;Patient tolerated treatment well    Behavior During Therapy WFL for tasks assessed/performed             Past Medical History:  Diagnosis Date   Asthma    Hypertension    PONV (postoperative nausea and vomiting)    Past Surgical History:  Procedure Laterality Date   ABDOMINAL HYSTERECTOMY     BREAST EXCISIONAL BIOPSY     BREAST LUMPECTOMY WITH RADIOACTIVE SEED LOCALIZATION Left 10/23/2019   Procedure: LEFT BREAST LUMPECTOMY WITH RADIOACTIVE SEED LOCALIZATION;  Surgeon: Caralyn Chandler, MD;  Location: [facility];  Service: General;  Laterality: Left;   CESAREAN SECTION     DG GALL BLADDER     FOOT SURGERY     Patient Active Problem List   Diagnosis Date Noted   At high risk for breast cancer 03/03/2020   Atypical ductal hyperplasia of left breast 11/27/2019   Essential hypertension 04/05/2017   Chest pain 03/06/2017   Family history of heart disease 03/06/2017    PCP: Jolynn Needy, PA  REFERRING PROVIDER: Saundra Curl, MD  REFERRING DIAG: rt TKR on 10/03/2023  THERAPY DIAG:  Right knee pain, unspecified chronicity  Difficulty in walking, not elsewhere classified  Stiffness of right knee, not elsewhere classified  Muscle weakness (generalized)  Rationale for Evaluation and Treatment: Rehabilitation  ONSET DATE: s/p  10/03/23  SUBJECTIVE:   SUBJECTIVE STATEMENT: Reports compliance with HEP; forgot  to take her pain medication this morning  EVAL:S/p right TKA 10/03/23 per Dr. Abigail Abler; arrives with RW and waterproof bandage in place; TED hose  PERTINENT HISTORY: Here previous for knee pain PAIN:  Are you having pain? Yes: NPRS scale: 3-7/10 Pain location: right knee Pain description: throbbing, tight Aggravating factors: bending, movemenet Relieving factors: ice rest, medication  PRECAUTIONS: None  RED FLAGS: None   WEIGHT BEARING RESTRICTIONS: No  FALLS:  Has patient fallen in last 6 months? Yes. Number of falls 1 prior to surgery   OCCUPATION: retired but works some at [employer]  PLOF: Independent  PATIENT GOALS: walk normally without walker  NEXT MD VISIT: 10/16/23  OBJECTIVE:  Note: Objective measures were completed at Evaluation unless otherwise noted.  DIAGNOSTIC FINDINGS: none  PATIENT SURVEYS:  LEFS next visit 10/10/23 LEFS 8/80 10%  COGNITION: Overall cognitive status: Within functional limits for tasks assessed     SENSATION: WFL  EDEMA:  Normal for this time s/p   PALPATION: General soreness  LOWER EXTREMITY ROM: very guarded  Active ROM Right eval Left eval Right 10/10/23 Right 10/15/23 Right  10/17/23 Right  10/22/23  Hip flexion        Hip extension        Hip abduction        Hip adduction        Hip internal rotation  Hip external rotation        Knee flexion 32  65 AAROM 74 AAROM 75 AAROM 72  Knee extension -15   -8 -10 -8  Ankle dorsiflexion Decreased versus left       Ankle plantarflexion        Ankle inversion        Ankle eversion         (Blank rows = not tested)  LOWER EXTREMITY MMT:  MMT Right eval Left eval  Hip flexion 2   Hip extension    Hip abduction    Hip adduction    Hip internal rotation    Hip external rotation    Knee flexion    Knee extension 2 (poor quad set)   Ankle dorsiflexion    Ankle plantarflexion    Ankle inversion    Ankle eversion     (Blank rows = not  tested)  FUNCTIONAL TESTS:  5 times sit to stand: 34.98 sec using hands to assist and reliant on left leg 2 minute walk test: 32 ft with RW  GAIT: Distance walked: 32 ft Assistive device utilized: Environmental consultant - 2 wheeled Level of assistance: SBA Comments: slow pace; step to gait                                                                                                                                TREATMENT DATE: 10/22/23 Supine: STM to right knee and manual ROM to improve right knee mobility x 15' AROM right knee -8 to 72 today Seated  Knee dangle AAROM knee flexion x 10 reps Gait training in // bars  10/17/23: -Seated Heel Slides, 10x, AAROM with towel under foot and use of LLE -Supine Quad sets, 1/2 foam roll under knee, 5" holds, 2x10 -Supine SAQ, full foam roll under knee, little foot clearance, therapist muscle tapping to help engage quad with some improvement -Seated LAQ, 10x, very little foot clearance from floor  -Supine Glute Set, 5" holds, 2x10 -ROM measurement -STM to posterior knee and calf musculature, 8' -Very gentle massage around edges of scar  10/15/23 Supine: STM followed by manual ROM to right knee and calf to decrease pain and improve joint mobility and soft tissue extensibility x 15' Supine: Quad sets 5" x 10 Ankle pumps x 15 SAQ's on 1/2 foam roll with assist Seated AAROM right knee flexion x 10 followed by dangle Seated heel slide x 10 Standing Marching x 5 each  10/10/23  LEFS 8/80 Review of HEP and goals  Supine: STM followed by manual ROM to right knee and calf to decrease pain and improve joint mobility and soft tissue extensibility x 15' AAROM right knee flexion 65 degrees Quad sets 5" hold x 10 (poor quad set) Trial of SAQ's AAROM x 5 Heel slides with strap x 10 Seated heel/toe raises Seated calf stretch 5 x 20" Standing calf stretch 2 x 10"  10/07/22 physical therapy evaluation  and HEP     PATIENT EDUCATION:  Education details:  Patient educated on exam findings, POC, scope of PT, HEP, and what to expect next visit. Person educated: Patient Education method: Explanation, Demonstration, and Handouts Education comprehension: verbalized understanding, returned demonstration, verbal cues required, and tactile cues required  HOME EXERCISE PROGRAM: Access Code: [email or web address] URL: [email or web address] Date: 10/07/2023 Prepared by: AP - Rehab  Exercises - Supine Ankle Pumps  - 2-3 x daily - 7 x weekly - 2 sets - 10-20 reps - Supine Heel Slide  - 2-3 x daily - 7 x weekly - 3 sets - 10-20 reps - 5 hold - Seated Knee Flexion  - 2-3 x daily - 7 x weekly - 3 sets - 10-20 reps - 10 hold - Seated Long Arc Quad  - 2-3 x daily - 7 x weekly - 3 sets - 10-20 reps - Supine Quad Set  - 2-3 x daily - 7 x weekly - 3 sets - 10 reps - Supine Heel Slide with Strap  - 2-3 x daily - 7 x weekly - 3 sets - 10 reps - Seated Knee Extension AAROM  - 2-3 x daily - 7 x weekly - 3 sets - 10 reps - Supine Knee Extension Stretch on Towel Roll  - 2-3 x daily - 7 x weekly - 3 sets - 10 reps  ASSESSMENT:  CLINICAL IMPRESSION: Patient very anxious with right knee mobility today likely due to forgetting pain meds.  Noted slight decrease in knee flexion today.  Encouraged to work hard on knee mobility at home.  Patient also fearful of letting go of the RW.  Gait training in the // bars today; patient still with difficulty with advancing her left leg past her right in stance phase.   Patient will benefit from continued skilled therapy services to address deficits and promote return to optimal function.   Eval: Patient is a 68 y.o. female who was seen today for physical therapy evaluation and treatment for s/p Right TKA.  Patient demonstrates muscle weakness, reduced ROM, and fascial restrictions which are likely contributing to symptoms of pain and are negatively impacting patient ability to perform ADLs and functional mobility tasks. Patient will  benefit from skilled physical therapy services to address these deficits to reduce pain and improve level of function with ADLs and functional mobility tasks.   OBJECTIVE IMPAIRMENTS: Abnormal gait, decreased activity tolerance, decreased mobility, difficulty walking, decreased ROM, decreased strength, increased fascial restrictions, and pain.   ACTIVITY LIMITATIONS: carrying, lifting, bending, sitting, standing, squatting, sleeping, stairs, transfers, bed mobility, locomotion level, and caring for others  PARTICIPATION LIMITATIONS: meal prep, cleaning, laundry, driving, shopping, community activity, and yard work  [facility] POTENTIAL: Good  CLINICAL DECISION MAKING: Evolving/moderate complexity  EVALUATION COMPLEXITY: Moderate   GOALS: Goals reviewed with patient? No  SHORT TERM GOALS: Target date: 10/28/23 patient will be independent with initial HEP   Baseline: Goal status: in progress  2.  Patient will report 50% improvement overall  Baseline:  Goal status: in progress   3.  Patient will increase right knee flexion to 90 to prepare for step navigation Baseline: 32 Goal status: in progress    LONG TERM GOALS: Target date: 11/22/23  Patient will be independent in self management strategies to improve quality of life and functional outcomes.  Baseline:  Goal status: in progress   2.  Patient will report 75% improvement overall  Baseline:  Goal status: in progress   3.  Patient will improve  LEFS score by 10  points to demonstrate improved perceived function   Baseline: 8/80 Goal status: in progress   4.   Patient will increase right leg MMT's to 5/5 to allow navigation of steps without gait deviation or loss of balance  Baseline: see above Goal status: in progress   5.  Patient will increase knee mobility to -2 to 120 to promote normal navigation of steps; step over step pattern  Baseline: see above Goal status: in progress   PLAN:  PT FREQUENCY:  2x/week  PT DURATION: 6 weeks  PLANNED INTERVENTIONS: 97164- PT Re-evaluation, 97110-Therapeutic exercises, 97530- Therapeutic activity, 97112- Neuromuscular re-education, 97535- Self Care, 82956- Manual therapy, [phone]- Gait training, [phone]- Orthotic Fit/training, [phone]- Canalith repositioning, [id number]- Aquatic Therapy, [phone]- Splinting, Patient/Family education, Balance training, Stair training, Taping, Dry Needling, Joint mobilization, Joint manipulation, Spinal manipulation, Spinal mobilization, Scar mobilization, and DME instructions.   PLAN FOR NEXT SESSION:  progress knee mobility and then strength; gait training  11:50 AM, 10/22/23 Takahiro Godinho Small Aimi Essner MPT [place] [facility] [place] [phone]

## 2023-10-24 ENCOUNTER — Ambulatory Visit (HOSPITAL_COMMUNITY): Admitting: Physical Therapy

## 2023-10-24 DIAGNOSIS — M6281 Muscle weakness (generalized): Secondary | ICD-10-CM

## 2023-10-24 DIAGNOSIS — M25661 Stiffness of right knee, not elsewhere classified: Secondary | ICD-10-CM

## 2023-10-24 DIAGNOSIS — M25561 Pain in right knee: Secondary | ICD-10-CM | POA: Diagnosis not present

## 2023-10-24 DIAGNOSIS — R262 Difficulty in walking, not elsewhere classified: Secondary | ICD-10-CM

## 2023-10-24 NOTE — Therapy (Signed)
 OUTPATIENT PHYSICAL THERAPY LOWER EXTREMITY TREATMENT   Patient Name: Amanda Roberts MRN: [id number] DOB:1955/07/09, 68 y.o., female Today's Date: 10/24/2023  END OF SESSION:  PT End of Session - 10/24/23 1417     Visit Number 6    Number of Visits 12    Date for PT Re-Evaluation 11/22/23    Authorization Type [employer]    Authorization Time Period no auth; no limit    Progress Note Due on Visit 10    PT Start Time 1350    PT Stop Time 1430    PT Time Calculation (min) 40 min    Activity Tolerance Patient limited by pain;Patient tolerated treatment well    Behavior During Therapy WFL for tasks assessed/performed              Past Medical History:  Diagnosis Date   Asthma    Hypertension    PONV (postoperative nausea and vomiting)    Past Surgical History:  Procedure Laterality Date   ABDOMINAL HYSTERECTOMY     BREAST EXCISIONAL BIOPSY     BREAST LUMPECTOMY WITH RADIOACTIVE SEED LOCALIZATION Left 10/23/2019   Procedure: LEFT BREAST LUMPECTOMY WITH RADIOACTIVE SEED LOCALIZATION;  Surgeon: Caralyn Chandler, MD;  Location: [facility];  Service: General;  Laterality: Left;   CESAREAN SECTION     DG GALL BLADDER     FOOT SURGERY     Patient Active Problem List   Diagnosis Date Noted   At high risk for breast cancer 03/03/2020   Atypical ductal hyperplasia of left breast 11/27/2019   Essential hypertension 04/05/2017   Chest pain 03/06/2017   Family history of heart disease 03/06/2017    PCP: Jolynn Needy, PA  REFERRING PROVIDER: Saundra Curl, MD  REFERRING DIAG: rt TKR on 10/03/2023  THERAPY DIAG:  Right knee pain, unspecified chronicity  Difficulty in walking, not elsewhere classified  Stiffness of right knee, not elsewhere classified  Muscle weakness (generalized)  Rationale for Evaluation and Treatment: Rehabilitation  ONSET DATE: s/p  10/03/23  SUBJECTIVE:   SUBJECTIVE STATEMENT: Reports compliance with HEP; forgot  to take her pain medication this morning  EVAL:S/p right TKA 10/03/23 per Dr. Abigail Abler; arrives with RW and waterproof bandage in place; TED hose  PERTINENT HISTORY: Here previous for knee pain PAIN:  Are you having pain? Yes: NPRS scale: 3-7/10 Pain location: right knee Pain description: throbbing, tight Aggravating factors: bending, movemenet Relieving factors: ice rest, medication  PRECAUTIONS: None  RED FLAGS: None   WEIGHT BEARING RESTRICTIONS: No  FALLS:  Has patient fallen in last 6 months? Yes. Number of falls 1 prior to surgery   OCCUPATION: retired but works some at [employer]  PLOF: Independent  PATIENT GOALS: walk normally without walker  NEXT MD VISIT: 10/16/23  OBJECTIVE:  Note: Objective measures were completed at Evaluation unless otherwise noted.  DIAGNOSTIC FINDINGS: none  PATIENT SURVEYS:  LEFS next visit 10/10/23 LEFS 8/80 10%  COGNITION: Overall cognitive status: Within functional limits for tasks assessed     SENSATION: WFL  EDEMA:  Normal for this time s/p   PALPATION: General soreness  LOWER EXTREMITY ROM: very guarded  Active ROM Right eval Left eval Right 10/10/23 Right 10/15/23 Right  10/17/23 Right  10/22/23  Hip flexion        Hip extension        Hip abduction        Hip adduction        Hip internal rotation  Hip external rotation        Knee flexion 32  65 AAROM 74 AAROM 75 AAROM 72  Knee extension -15   -8 -10 -8  Ankle dorsiflexion Decreased versus left       Ankle plantarflexion        Ankle inversion        Ankle eversion         (Blank rows = not tested)  LOWER EXTREMITY MMT:  MMT Right eval Left eval  Hip flexion 2   Hip extension    Hip abduction    Hip adduction    Hip internal rotation    Hip external rotation    Knee flexion    Knee extension 2 (poor quad set)   Ankle dorsiflexion    Ankle plantarflexion    Ankle inversion    Ankle eversion     (Blank rows = not  tested)  FUNCTIONAL TESTS:  5 times sit to stand: 34.98 sec using hands to assist and reliant on left leg 2 minute walk test: 32 ft with RW  GAIT: Distance walked: 32 ft Assistive device utilized: Environmental consultant - 2 wheeled Level of assistance: SBA Comments: slow pace; step to gait                                                                                                                                TREATMENT DATE: 10/24/23 Bike seat 8 rocking 5 minutes (AAROM first couple motions each way) Standing Rt knee flexion stretch on 12" step 10X10"  Rt knee flexion 10X Supine: STM to Rt knee; retro for edema  QS 5X3"  SLR AAROM with quad set 5X  SLR AROM with QS/slight extension lag 2X   10/22/23 Supine: STM to right knee and manual ROM to improve right knee mobility x 15' AROM right knee -8 to 72 today Seated  Knee dangle AAROM knee flexion x 10 reps Gait training in // bars  10/17/23: -Seated Heel Slides, 10x, AAROM with towel under foot and use of LLE -Supine Quad sets, 1/2 foam roll under knee, 5" holds, 2x10 -Supine SAQ, full foam roll under knee, little foot clearance, therapist muscle tapping to help engage quad with some improvement -Seated LAQ, 10x, very little foot clearance from floor  -Supine Glute Set, 5" holds, 2x10 -ROM measurement -STM to posterior knee and calf musculature, 8' -Very gentle massage around edges of scar  10/15/23 Supine: STM followed by manual ROM to right knee and calf to decrease pain and improve joint mobility and soft tissue extensibility x 15' Supine: Quad sets 5" x 10 Ankle pumps x 15 SAQ's on 1/2 foam roll with assist Seated AAROM right knee flexion x 10 followed by dangle Seated heel slide x 10 Standing Marching x 5 each  10/10/23  LEFS 8/80 Review of HEP and goals  Supine: STM followed by manual ROM to right knee and calf to decrease pain and improve  joint mobility and soft tissue extensibility x 15' AAROM right knee flexion 65  degrees Quad sets 5" hold x 10 (poor quad set) Trial of SAQ's AAROM x 5 Heel slides with strap x 10 Seated heel/toe raises Seated calf stretch 5 x 20" Standing calf stretch 2 x 10"  10/07/22 physical therapy evaluation and HEP     PATIENT EDUCATION:  Education details: Patient educated on exam findings, POC, scope of PT, HEP, and what to expect next visit. Person educated: Patient Education method: Explanation, Demonstration, and Handouts Education comprehension: verbalized understanding, returned demonstration, verbal cues required, and tactile cues required  HOME EXERCISE PROGRAM: Access Code: [email or web address] URL: [email or web address] Date: 10/07/2023 Prepared by: AP - Rehab  Exercises - Supine Ankle Pumps  - 2-3 x daily - 7 x weekly - 2 sets - 10-20 reps - Supine Heel Slide  - 2-3 x daily - 7 x weekly - 3 sets - 10-20 reps - 5 hold - Seated Knee Flexion  - 2-3 x daily - 7 x weekly - 3 sets - 10-20 reps - 10 hold - Seated Long Arc Quad  - 2-3 x daily - 7 x weekly - 3 sets - 10-20 reps - Supine Quad Set  - 2-3 x daily - 7 x weekly - 3 sets - 10 reps - Supine Heel Slide with Strap  - 2-3 x daily - 7 x weekly - 3 sets - 10 reps - Seated Knee Extension AAROM  - 2-3 x daily - 7 x weekly - 3 sets - 10 reps - Supine Knee Extension Stretch on Towel Roll  - 2-3 x daily - 7 x weekly - 3 sets - 10 reps  ASSESSMENT:  CLINICAL IMPRESSION: Patient continues to be anxious, reluctant to move Rt LE or place full weight through it.  Required AA with bike, getting into pedals and first couple movements as very aprehensive to attempt activity.  Pt pleased afterward with ability to complete this activty.  Standing knee flexion completed with 12" step with pt reporting "this feels good".  Continues to have a lot of edema; discussed using compression garment but pt declines as she would be unable to don or let anyone else don garment either. Pt aware of how beneficial this would be for her  discomfort and mobiltiy and educated on elevation/icing.  Worked on ambulation, keeping the walker moving in order to place more weight through Rt LE, however still apprehensive of advancing her left leg past her right in stance phase.  Continued with manual to reduce edema and induration.   Patient will benefit from continued skilled therapy services to address deficits and promote return to optimal function.   Eval: Patient is a 68 y.o. female who was seen today for physical therapy evaluation and treatment for s/p Right TKA.  Patient demonstrates muscle weakness, reduced ROM, and fascial restrictions which are likely contributing to symptoms of pain and are negatively impacting patient ability to perform ADLs and functional mobility tasks. Patient will benefit from skilled physical therapy services to address these deficits to reduce pain and improve level of function with ADLs and functional mobility tasks.   OBJECTIVE IMPAIRMENTS: Abnormal gait, decreased activity tolerance, decreased mobility, difficulty walking, decreased ROM, decreased strength, increased fascial restrictions, and pain.   ACTIVITY LIMITATIONS: carrying, lifting, bending, sitting, standing, squatting, sleeping, stairs, transfers, bed mobility, locomotion level, and caring for others  PARTICIPATION LIMITATIONS: meal prep, cleaning, laundry, driving, shopping, community activity, and yard work  [facility] POTENTIAL: [place]  CLINICAL DECISION MAKING: Evolving/moderate complexity  EVALUATION COMPLEXITY: Moderate   GOALS: Goals reviewed with patient? No  SHORT TERM GOALS: Target date: 10/28/23 patient will be independent with initial HEP   Baseline: Goal status: in progress  2.  Patient will report 50% improvement overall  Baseline:  Goal status: in progress   3.  Patient will increase right knee flexion to 90 to prepare for step navigation Baseline: 32 Goal status: in progress    LONG TERM GOALS: Target date:  11/22/23  Patient will be independent in self management strategies to improve quality of life and functional outcomes.  Baseline:  Goal status: in progress   2.  Patient will report 75% improvement overall  Baseline:  Goal status: in progress   3.  Patient will improve LEFS score by 10  points to demonstrate improved perceived function   Baseline: 8/80 Goal status: in progress   4.   Patient will increase right leg MMT's to 5/5 to allow navigation of steps without gait deviation or loss of balance  Baseline: see above Goal status: in progress   5.  Patient will increase knee mobility to -2 to 120 to promote normal navigation of steps; step over step pattern  Baseline: see above Goal status: in progress   PLAN:  PT FREQUENCY: 2x/week  PT DURATION: 6 weeks  PLANNED INTERVENTIONS: 97164- PT Re-evaluation, 97110-Therapeutic exercises, 97530- Therapeutic activity, 97112- Neuromuscular re-education, 97535- Self Care, 16109- Manual therapy, [phone]- Gait training, [phone]- Orthotic Fit/training, [phone]- Canalith repositioning, [id number]- Aquatic Therapy, [phone]- Splinting, Patient/Family education, Balance training, Stair training, Taping, Dry Needling, Joint mobilization, Joint manipulation, Spinal manipulation, Spinal mobilization, Scar mobilization, and DME instructions.   PLAN FOR NEXT SESSION:  progress knee mobility and then strength; gait training  2:17 PM, 10/24/23 Lorenso Romance, PTA/CLT [facility] Ph: [phone]

## 2023-10-29 ENCOUNTER — Encounter (HOSPITAL_COMMUNITY)

## 2023-10-30 ENCOUNTER — Other Ambulatory Visit: Payer: Self-pay

## 2023-10-30 ENCOUNTER — Ambulatory Visit (HOSPITAL_COMMUNITY)

## 2023-10-30 ENCOUNTER — Encounter (HOSPITAL_COMMUNITY): Payer: Self-pay

## 2023-10-30 DIAGNOSIS — M25661 Stiffness of right knee, not elsewhere classified: Secondary | ICD-10-CM

## 2023-10-30 DIAGNOSIS — M6281 Muscle weakness (generalized): Secondary | ICD-10-CM

## 2023-10-30 DIAGNOSIS — M25561 Pain in right knee: Secondary | ICD-10-CM | POA: Diagnosis not present

## 2023-10-30 DIAGNOSIS — R262 Difficulty in walking, not elsewhere classified: Secondary | ICD-10-CM

## 2023-10-30 NOTE — Therapy (Signed)
 OUTPATIENT PHYSICAL THERAPY LOWER EXTREMITY TREATMENT   Patient Name: Amanda Roberts MRN: [id number] DOB:1955-10-11, 68 y.o., female Today's Date: 10/30/2023  END OF SESSION:  PT End of Session - 10/30/23 1106     Visit Number 7    Number of Visits 12    Date for PT Re-Evaluation 11/22/23    Authorization Type [employer]    Authorization Time Period no auth; no limit    Progress Note Due on Visit 10    PT Start Time 1100    PT Stop Time 1145    PT Time Calculation (min) 45 min    Equipment Utilized During Treatment Gait belt    Activity Tolerance Patient limited by pain;Patient tolerated treatment well    Behavior During Therapy WFL for tasks assessed/performed               Past Medical History:  Diagnosis Date   Asthma    Hypertension    PONV (postoperative nausea and vomiting)    Past Surgical History:  Procedure Laterality Date   ABDOMINAL HYSTERECTOMY     BREAST EXCISIONAL BIOPSY     BREAST LUMPECTOMY WITH RADIOACTIVE SEED LOCALIZATION Left 10/23/2019   Procedure: LEFT BREAST LUMPECTOMY WITH RADIOACTIVE SEED LOCALIZATION;  Surgeon: Caralyn Chandler, MD;  Location: [facility];  Service: General;  Laterality: Left;   CESAREAN SECTION     DG GALL BLADDER     FOOT SURGERY     Patient Active Problem List   Diagnosis Date Noted   At high risk for breast cancer 03/03/2020   Atypical ductal hyperplasia of left breast 11/27/2019   Essential hypertension 04/05/2017   Chest pain 03/06/2017   Family history of heart disease 03/06/2017    PCP: Jolynn Needy, PA  REFERRING PROVIDER: Saundra Curl, MD  REFERRING DIAG: rt TKR on 10/03/2023  THERAPY DIAG:  Right knee pain, unspecified chronicity  Difficulty in walking, not elsewhere classified  Stiffness of right knee, not elsewhere classified  Muscle weakness (generalized)  Rationale for Evaluation and Treatment: Rehabilitation  ONSET DATE: s/p  10/03/23  SUBJECTIVE:    SUBJECTIVE STATEMENT: Reports she has been compliant with HEP doing it 2x a day. Still having bruising on the back of her leg and it is still really hard to bend and fully straighten her knee.   EVAL:S/p right TKA 10/03/23 per Dr. Abigail Abler; arrives with RW and waterproof bandage in place; TED hose  PERTINENT HISTORY: Here previous for knee pain PAIN:  Are you having pain? Yes: NPRS scale: 3-7/10 Pain location: right knee Pain description: throbbing, tight Aggravating factors: bending, movemenet Relieving factors: ice rest, medication  PRECAUTIONS: None  RED FLAGS: None   WEIGHT BEARING RESTRICTIONS: No  FALLS:  Has patient fallen in last 6 months? Yes. Number of falls 1 prior to surgery   OCCUPATION: retired but works some at [employer]  PLOF: Independent  PATIENT GOALS: walk normally without walker  NEXT MD VISIT: last week of May  OBJECTIVE:  Note: Objective measures were completed at Evaluation unless otherwise noted.  DIAGNOSTIC FINDINGS: none  PATIENT SURVEYS:  LEFS next visit 10/10/23 LEFS 8/80 10%  COGNITION: Overall cognitive status: Within functional limits for tasks assessed     SENSATION: WFL  EDEMA:  Normal for this time s/p   PALPATION: General soreness  LOWER EXTREMITY ROM: very guarded  Active ROM Right eval Left eval Right 10/10/23 Right 10/15/23 Right  10/17/23 Right  10/22/23  Hip flexion  Hip extension        Hip abduction        Hip adduction        Hip internal rotation        Hip external rotation        Knee flexion 32  65 AAROM 74 AAROM 75 AAROM 72  Knee extension -15   -8 -10 -8  Ankle dorsiflexion Decreased versus left       Ankle plantarflexion        Ankle inversion        Ankle eversion         (Blank rows = not tested)  LOWER EXTREMITY MMT:  MMT Right eval Left eval  Hip flexion 2   Hip extension    Hip abduction    Hip adduction    Hip internal rotation    Hip external rotation    Knee  flexion    Knee extension 2 (poor quad set)   Ankle dorsiflexion    Ankle plantarflexion    Ankle inversion    Ankle eversion     (Blank rows = not tested)  FUNCTIONAL TESTS:  5 times sit to stand: 34.98 sec using hands to assist and reliant on left leg 2 minute walk test: 32 ft with RW  GAIT: Distance walked: 32 ft Assistive device utilized: Environmental consultant - 2 wheeled Level of assistance: SBA Comments: slow pace; step to gait                                                                                                                                TREATMENT DATE: 10/30/23 Stepper 5 minutes with cues for full TKE and gradual increase in flexion AAROM as tolerated  Gait training over hurdles in // bars (3 laps with knee flexion, hip flexion, knee extension with toe up, heel strike cuing) Gait training 160' w/ RW and cues from above as well as continual propulsion of RW in order to reduce WB in UE and increased WB in SLS on surgical knee for quad activation/mm activation TA: Education on continuing to perform TKE with patellar glides per PT performed and understanding noted with functional quad activation needed for optimal gait and weight acceptance  Performance of quad set and knee flexion/extension in semi supine needed for progression of ROM needed for functional activities  10/24/23 Bike seat 8 rocking 5 minutes (AAROM first couple motions each way) Standing Rt knee flexion stretch on 12" step 10X10"  Rt knee flexion 10X Supine: STM to Rt knee; retro for edema  QS 5X3"  SLR AAROM with quad set 5X  SLR AROM with QS/slight extension lag 2X   10/22/23 Supine: STM to right knee and manual ROM to improve right knee mobility x 15' AROM right knee -8 to 72 today Seated  Knee dangle AAROM knee flexion x 10 reps Gait training in // bars  10/17/23: -Seated Heel Slides, 10x, AAROM with towel  under foot and use of LLE -Supine Quad sets, 1/2 foam roll under knee, 5" holds, 2x10 -Supine  SAQ, full foam roll under knee, little foot clearance, therapist muscle tapping to help engage quad with some improvement -Seated LAQ, 10x, very little foot clearance from floor  -Supine Glute Set, 5" holds, 2x10 -ROM measurement -STM to posterior knee and calf musculature, 8' -Very gentle massage around edges of scar  10/15/23 Supine: STM followed by manual ROM to right knee and calf to decrease pain and improve joint mobility and soft tissue extensibility x 15' Supine: Quad sets 5" x 10 Ankle pumps x 15 SAQ's on 1/2 foam roll with assist Seated AAROM right knee flexion x 10 followed by dangle Seated heel slide x 10 Standing Marching x 5 each  10/10/23  LEFS 8/80 Review of HEP and goals  Supine: STM followed by manual ROM to right knee and calf to decrease pain and improve joint mobility and soft tissue extensibility x 15' AAROM right knee flexion 65 degrees Quad sets 5" hold x 10 (poor quad set) Trial of SAQ's AAROM x 5 Heel slides with strap x 10 Seated heel/toe raises Seated calf stretch 5 x 20" Standing calf stretch 2 x 10"  10/07/22 physical therapy evaluation and HEP     PATIENT EDUCATION:  Education details: Patient educated on exam findings, POC, scope of PT, HEP, and what to expect next visit. Person educated: Patient Education method: Explanation, Demonstration, and Handouts Education comprehension: verbalized understanding, returned demonstration, verbal cues required, and tactile cues required  HOME EXERCISE PROGRAM: Access Code: [email or web address] URL: [email or web address] Date: 10/07/2023 Prepared by: AP - Rehab  Exercises - Supine Ankle Pumps  - 2-3 x daily - 7 x weekly - 2 sets - 10-20 reps - Supine Heel Slide  - 2-3 x daily - 7 x weekly - 3 sets - 10-20 reps - 5 hold - Seated Knee Flexion  - 2-3 x daily - 7 x weekly - 3 sets - 10-20 reps - 10 hold - Seated Long Arc Quad  - 2-3 x daily - 7 x weekly - 3 sets - 10-20 reps - Supine Quad Set  - 2-3  x daily - 7 x weekly - 3 sets - 10 reps - Supine Heel Slide with Strap  - 2-3 x daily - 7 x weekly - 3 sets - 10 reps - Seated Knee Extension AAROM  - 2-3 x daily - 7 x weekly - 3 sets - 10 reps - Supine Knee Extension Stretch on Towel Roll  - 2-3 x daily - 7 x weekly - 3 sets - 10 reps  ASSESSMENT:  CLINICAL IMPRESSION: Patient continues to have decreased reliance on R LE and increased UE reliance during gait reducing quad/mm activation in R LE with reduction in WB therefore emphasis on gait training and weight acceptance / quad activation during session. Required max cuing for improving functional gait mechanics and performed follow through gait mechanics initiating in parallel bars followed by gait with RW throughout clinic with intentional stepping. Ended session with education and reassessment of ROM/stiffness noted in knee with reduced patellar mobility and decreased quad activation noted. Instructed patient on increasing frequency of quad setting to 4-6 sets of 10 times a day as well as challenging her boundaries with knee flexion in sitting with AAROM seated knee flexion glides. Patient will benefit from continued skilled therapy services to address deficits and promote return to optimal function.   Eval: Patient is a 68 y.o. female  who was seen today for physical therapy evaluation and treatment for s/p Right TKA.  Patient demonstrates muscle weakness, reduced ROM, and fascial restrictions which are likely contributing to symptoms of pain and are negatively impacting patient ability to perform ADLs and functional mobility tasks. Patient will benefit from skilled physical therapy services to address these deficits to reduce pain and improve level of function with ADLs and functional mobility tasks.   OBJECTIVE IMPAIRMENTS: Abnormal gait, decreased activity tolerance, decreased mobility, difficulty walking, decreased ROM, decreased strength, increased fascial restrictions, and pain.   ACTIVITY  LIMITATIONS: carrying, lifting, bending, sitting, standing, squatting, sleeping, stairs, transfers, bed mobility, locomotion level, and caring for others  PARTICIPATION LIMITATIONS: meal prep, cleaning, laundry, driving, shopping, community activity, and yard work  [facility] POTENTIAL: Good  CLINICAL DECISION MAKING: Evolving/moderate complexity  EVALUATION COMPLEXITY: Moderate   GOALS: Goals reviewed with patient? No  SHORT TERM GOALS: Target date: 10/28/23 patient will be independent with initial HEP   Baseline: Goal status: in progress  2.  Patient will report 50% improvement overall  Baseline:  Goal status: in progress   3.  Patient will increase right knee flexion to 90 to prepare for step navigation Baseline: 32 Goal status: in progress    LONG TERM GOALS: Target date: 11/22/23  Patient will be independent in self management strategies to improve quality of life and functional outcomes.  Baseline:  Goal status: in progress   2.  Patient will report 75% improvement overall  Baseline:  Goal status: in progress   3.  Patient will improve LEFS score by 10  points to demonstrate improved perceived function   Baseline: 8/80 Goal status: in progress   4.   Patient will increase right leg MMT's to 5/5 to allow navigation of steps without gait deviation or loss of balance  Baseline: see above Goal status: in progress   5.  Patient will increase knee mobility to -2 to 120 to promote normal navigation of steps; step over step pattern  Baseline: see above Goal status: in progress   PLAN:  PT FREQUENCY: 2x/week  PT DURATION: 6 weeks  PLANNED INTERVENTIONS: 97164- PT Re-evaluation, 97110-Therapeutic exercises, 97530- Therapeutic activity, 97112- Neuromuscular re-education, 97535- Self Care, [id number]- Manual therapy, [id number]- Gait training, [phone]- Orthotic Fit/training, [phone]- Canalith repositioning, [id number]- Aquatic Therapy, [phone]- Splinting, Patient/Family education,  Balance training, Stair training, Taping, Dry Needling, Joint mobilization, Joint manipulation, Spinal manipulation, Spinal mobilization, Scar mobilization, and DME instructions.   PLAN FOR NEXT SESSION:  progress knee mobility and then strength; gait training  2:24 PM, 10/30/23 Lavaun Porto PT, DPT [facility] [place] [phone] office

## 2023-10-31 ENCOUNTER — Encounter (HOSPITAL_COMMUNITY)

## 2023-11-01 ENCOUNTER — Ambulatory Visit (HOSPITAL_COMMUNITY)

## 2023-11-01 DIAGNOSIS — R262 Difficulty in walking, not elsewhere classified: Secondary | ICD-10-CM

## 2023-11-01 DIAGNOSIS — M25661 Stiffness of right knee, not elsewhere classified: Secondary | ICD-10-CM

## 2023-11-01 DIAGNOSIS — M25561 Pain in right knee: Secondary | ICD-10-CM | POA: Diagnosis not present

## 2023-11-01 DIAGNOSIS — M6281 Muscle weakness (generalized): Secondary | ICD-10-CM

## 2023-11-01 NOTE — Therapy (Signed)
 OUTPATIENT PHYSICAL THERAPY LOWER EXTREMITY TREATMENT   Patient Name: Amanda Roberts MRN: [id number] DOB:[birth year], 68 y.o., female Today's Date: 11/01/2023  END OF SESSION:  PT End of Session - 11/01/23 1345     Visit Number 8    Number of Visits 12    Date for PT Re-Evaluation 11/22/23    Authorization Type [employer]    Authorization Time Period no auth; no limit    Progress Note Due on Visit 10    PT Start Time 1346    PT Stop Time 1426    PT Time Calculation (min) 40 min    Equipment Utilized During Treatment Gait belt    Activity Tolerance Patient limited by pain;Patient tolerated treatment well    Behavior During Therapy WFL for tasks assessed/performed               Past Medical History:  Diagnosis Date   Asthma    Hypertension    PONV (postoperative nausea and vomiting)    Past Surgical History:  Procedure Laterality Date   ABDOMINAL HYSTERECTOMY     BREAST EXCISIONAL BIOPSY     BREAST LUMPECTOMY WITH RADIOACTIVE SEED LOCALIZATION Left 10/23/2019   Procedure: LEFT BREAST LUMPECTOMY WITH RADIOACTIVE SEED LOCALIZATION;  Surgeon: Caralyn Chandler, MD;  Location: [facility];  Service: General;  Laterality: Left;   CESAREAN SECTION     DG GALL BLADDER     FOOT SURGERY     Patient Active Problem List   Diagnosis Date Noted   At high risk for breast cancer 03/03/2020   Atypical ductal hyperplasia of left breast 11/27/2019   Essential hypertension 04/05/2017   Chest pain 03/06/2017   Family history of heart disease 03/06/2017    PCP: Jolynn Needy, PA  REFERRING PROVIDER: Saundra Curl, MD  REFERRING DIAG: rt TKR on 10/03/2023  THERAPY DIAG:  Right knee pain, unspecified chronicity  Difficulty in walking, not elsewhere classified  Stiffness of right knee, not elsewhere classified  Muscle weakness (generalized)  Rationale for Evaluation and Treatment: Rehabilitation  ONSET DATE: s/p  10/03/23  SUBJECTIVE:    SUBJECTIVE STATEMENT: Reports she has been compliant with HEP doing it 2x a day. Still having bruising on the back of her leg and it is still really hard to bend and fully straighten her knee.   EVAL:S/p right TKA 10/03/23 per Dr. Abigail Abler; arrives with RW and waterproof bandage in place; TED hose  PERTINENT HISTORY: Here previous for knee pain PAIN:  Are you having pain? Yes: NPRS scale: 3-7/10 Pain location: right knee Pain description: throbbing, tight Aggravating factors: bending, movemenet Relieving factors: ice rest, medication  PRECAUTIONS: None  RED FLAGS: None   WEIGHT BEARING RESTRICTIONS: No  FALLS:  Has patient fallen in last 6 months? Yes. Number of falls 1 prior to surgery   OCCUPATION: retired but works some at [employer]  PLOF: Independent  PATIENT GOALS: walk normally without walker  NEXT MD VISIT: last week of May  OBJECTIVE:  Note: Objective measures were completed at Evaluation unless otherwise noted.  DIAGNOSTIC FINDINGS: none  PATIENT SURVEYS:  LEFS next visit 10/10/23 LEFS 8/80 10%  COGNITION: Overall cognitive status: Within functional limits for tasks assessed     SENSATION: WFL  EDEMA:  Normal for this time s/p   PALPATION: General soreness  LOWER EXTREMITY ROM: very guarded  Active ROM Right eval Left eval Right 10/10/23 Right 10/15/23 Right  10/17/23 Right  10/22/23 Right 11/01/23  Hip  flexion         Hip extension         Hip abduction         Hip adduction         Hip internal rotation         Hip external rotation         Knee flexion 32  65 AAROM 74 AAROM 75 AAROM 72 90  Knee extension -15   -8 -10 -8 -5  Ankle dorsiflexion Decreased versus left        Ankle plantarflexion         Ankle inversion         Ankle eversion          (Blank rows = not tested)  LOWER EXTREMITY MMT:  MMT Right eval Left eval  Hip flexion 2   Hip extension    Hip abduction    Hip adduction    Hip internal rotation    Hip  external rotation    Knee flexion    Knee extension 2 (poor quad set)   Ankle dorsiflexion    Ankle plantarflexion    Ankle inversion    Ankle eversion     (Blank rows = not tested)  FUNCTIONAL TESTS:  5 times sit to stand: 34.98 sec using hands to assist and reliant on left leg 2 minute walk test: 32 ft with RW  GAIT: Distance walked: 32 ft Assistive device utilized: Environmental consultant - 2 wheeled Level of assistance: SBA Comments: slow pace; step to gait                                                                                                                                TREATMENT DATE: 11/01/23 STM to right knee and manual ROM to improve right knee mobility x 15' AROM right knee -5 to 90 today seated Standing: Heel and toe raises 2 x 10 8" step knee drives for flexion x 2' TKE blue theraband x 20 8" box hamstring stretch 5 x 20" Slant board stretch 5 x 20" Updated HEP   10/30/23 Stepper 5 minutes with cues for full TKE and gradual increase in flexion AAROM as tolerated  Gait training over hurdles in // bars (3 laps with knee flexion, hip flexion, knee extension with toe up, heel strike cuing) Gait training 160' w/ RW and cues from above as well as continual propulsion of RW in order to reduce WB in UE and increased WB in SLS on surgical knee for quad activation/mm activation TA: Education on continuing to perform TKE with patellar glides per PT performed and understanding noted with functional quad activation needed for optimal gait and weight acceptance  Performance of quad set and knee flexion/extension in semi supine needed for progression of ROM needed for functional activities  10/24/23 Bike seat 8 rocking 5 minutes (AAROM first couple motions each way) Standing Rt knee flexion stretch on 12" step  10X10"  Rt knee flexion 10X Supine: STM to Rt knee; retro for edema  QS 5X3"  SLR AAROM with quad set 5X  SLR AROM with QS/slight extension lag 2X   10/22/23 Supine: STM  to right knee and manual ROM to improve right knee mobility x 15' AROM right knee -8 to 72 today Seated  Knee dangle AAROM knee flexion x 10 reps Gait training in // bars  10/17/23: -Seated Heel Slides, 10x, AAROM with towel under foot and use of LLE -Supine Quad sets, 1/2 foam roll under knee, 5" holds, 2x10 -Supine SAQ, full foam roll under knee, little foot clearance, therapist muscle tapping to help engage quad with some improvement -Seated LAQ, 10x, very little foot clearance from floor  -Supine Glute Set, 5" holds, 2x10 -ROM measurement -STM to posterior knee and calf musculature, 8' -Very gentle massage around edges of scar  10/15/23 Supine: STM followed by manual ROM to right knee and calf to decrease pain and improve joint mobility and soft tissue extensibility x 15' Supine: Quad sets 5" x 10 Ankle pumps x 15 SAQ's on 1/2 foam roll with assist Seated AAROM right knee flexion x 10 followed by dangle Seated heel slide x 10 Standing Marching x 5 each  10/10/23  LEFS 8/80 Review of HEP and goals  Supine: STM followed by manual ROM to right knee and calf to decrease pain and improve joint mobility and soft tissue extensibility x 15' AAROM right knee flexion 65 degrees Quad sets 5" hold x 10 (poor quad set) Trial of SAQ's AAROM x 5 Heel slides with strap x 10 Seated heel/toe raises Seated calf stretch 5 x 20" Standing calf stretch 2 x 10"  10/07/22 physical therapy evaluation and HEP     PATIENT EDUCATION:  Education details: Patient educated on exam findings, POC, scope of PT, HEP, and what to expect next visit. Person educated: Patient Education method: Explanation, Demonstration, and Handouts Education comprehension: verbalized understanding, returned demonstration, verbal cues required, and tactile cues required  HOME EXERCISE PROGRAM: 11/01/23 - Standing Hamstring Stretch with Step  - 2 x daily - 7 x weekly - 1 sets - 5 reps - 20" hold - knee bends on the step  using rails to hold onto  - 2 x daily - 7 x weekly - 3 sets - 10 reps - 5 sec hold  Access Code: [email or web address] URL: [email or web address] Date: 10/07/2023 Prepared by: [facility] - Rehab  Exercises - Supine Ankle Pumps  - 2-3 x daily - 7 x weekly - 2 sets - 10-20 reps - Supine Heel Slide  - 2-3 x daily - 7 x weekly - 3 sets - 10-20 reps - 5 hold - Seated Knee Flexion  - 2-3 x daily - 7 x weekly - 3 sets - 10-20 reps - 10 hold - Seated Long Arc Quad  - 2-3 x daily - 7 x weekly - 3 sets - 10-20 reps - Supine Quad Set  - 2-3 x daily - 7 x weekly - 3 sets - 10 reps - Supine Heel Slide with Strap  - 2-3 x daily - 7 x weekly - 3 sets - 10 reps - Seated Knee Extension AAROM  - 2-3 x daily - 7 x weekly - 3 sets - 10 reps - Supine Knee Extension Stretch on Towel Roll  - 2-3 x daily - 7 x weekly - 3 sets - 10 reps  ASSESSMENT:  CLINICAL IMPRESSION: Today's session with continued focus on right knee  mobility; some strengthening.  Patient with slowly improving knee mobility; able to get to 90 flexion today in supine.  Noted continues with extension lag more prevalent weightbearing activities so added theraband TKE's.  Stretching of hamstrings and calves added to loosen posterior chain to allow for more knee extension.  Patient still quite reliant on walker so we need to work on weaning her to cane.  Patient will benefit from continued skilled therapy services to address deficits and promote return to optimal function.   Eval: Patient is a 68 y.o. female who was seen today for physical therapy evaluation and treatment for s/p Right TKA.  Patient demonstrates muscle weakness, reduced ROM, and fascial restrictions which are likely contributing to symptoms of pain and are negatively impacting patient ability to perform ADLs and functional mobility tasks. Patient will benefit from skilled physical therapy services to address these deficits to reduce pain and improve level of function with ADLs and functional  mobility tasks.   OBJECTIVE IMPAIRMENTS: Abnormal gait, decreased activity tolerance, decreased mobility, difficulty walking, decreased ROM, decreased strength, increased fascial restrictions, and pain.   ACTIVITY LIMITATIONS: carrying, lifting, bending, sitting, standing, squatting, sleeping, stairs, transfers, bed mobility, locomotion level, and caring for others  PARTICIPATION LIMITATIONS: meal prep, cleaning, laundry, driving, shopping, community activity, and yard work  [facility] POTENTIAL: Good  CLINICAL DECISION MAKING: Evolving/moderate complexity  EVALUATION COMPLEXITY: Moderate   GOALS: Goals reviewed with patient? No  SHORT TERM GOALS: Target date: 10/28/23 patient will be independent with initial HEP   Baseline: Goal status: in progress  2.  Patient will report 50% improvement overall  Baseline:  Goal status: in progress   3.  Patient will increase right knee flexion to 90 to prepare for step navigation Baseline: 32 Goal status: in progress    LONG TERM GOALS: Target date: 11/22/23  Patient will be independent in self management strategies to improve quality of life and functional outcomes.  Baseline:  Goal status: in progress   2.  Patient will report 75% improvement overall  Baseline:  Goal status: in progress   3.  Patient will improve LEFS score by 10  points to demonstrate improved perceived function   Baseline: 8/80 Goal status: in progress   4.   Patient will increase right leg MMT's to 5/5 to allow navigation of steps without gait deviation or loss of balance  Baseline: see above Goal status: in progress   5.  Patient will increase knee mobility to -2 to 120 to promote normal navigation of steps; step over step pattern  Baseline: see above Goal status: in progress   PLAN:  PT FREQUENCY: 2x/week  PT DURATION: 6 weeks  PLANNED INTERVENTIONS: 97164- PT Re-evaluation, 97110-Therapeutic exercises, 97530- Therapeutic activity, 97112-  Neuromuscular re-education, 97535- Self Care, [id number]- Manual therapy, [phone]- Gait training, [phone]- Orthotic Fit/training, [phone]- Canalith repositioning, [id number]- Aquatic Therapy, [phone]- Splinting, Patient/Family education, Balance training, Stair training, Taping, Dry Needling, Joint mobilization, Joint manipulation, Spinal manipulation, Spinal mobilization, Scar mobilization, and DME instructions.   PLAN FOR NEXT SESSION:  progress knee mobility and then strength; gait training with SPC  2:36 PM, 11/01/23 Caeleigh Prohaska Small Bluma Buresh MPT [facility] [place] [phone] [phone]

## 2023-11-05 ENCOUNTER — Encounter (HOSPITAL_COMMUNITY): Payer: Self-pay

## 2023-11-05 ENCOUNTER — Ambulatory Visit (HOSPITAL_COMMUNITY)

## 2023-11-05 DIAGNOSIS — R262 Difficulty in walking, not elsewhere classified: Secondary | ICD-10-CM

## 2023-11-05 DIAGNOSIS — M25561 Pain in right knee: Secondary | ICD-10-CM | POA: Diagnosis not present

## 2023-11-05 DIAGNOSIS — M25661 Stiffness of right knee, not elsewhere classified: Secondary | ICD-10-CM

## 2023-11-05 DIAGNOSIS — M6281 Muscle weakness (generalized): Secondary | ICD-10-CM

## 2023-11-05 NOTE — Therapy (Signed)
 OUTPATIENT PHYSICAL THERAPY LOWER EXTREMITY TREATMENT   Patient Name: Amanda Roberts MRN: [id number] DOB:[birth year], 68 y.o., female Today's Date: 11/05/2023  END OF SESSION:  PT End of Session - 11/05/23 1519     Visit Number 9    Number of Visits 12    Date for PT Re-Evaluation 11/22/23    Authorization Type [employer]    Authorization Time Period no auth; no limit    Progress Note Due on Visit 10    PT Start Time 1435    PT Stop Time 1518    PT Time Calculation (min) 43 min    Equipment Utilized During Treatment Gait belt    Activity Tolerance Patient limited by pain;Patient tolerated treatment well    Behavior During Therapy WFL for tasks assessed/performed                Past Medical History:  Diagnosis Date   Asthma    Hypertension    PONV (postoperative nausea and vomiting)    Past Surgical History:  Procedure Laterality Date   ABDOMINAL HYSTERECTOMY     BREAST EXCISIONAL BIOPSY     BREAST LUMPECTOMY WITH RADIOACTIVE SEED LOCALIZATION Left 10/23/2019   Procedure: LEFT BREAST LUMPECTOMY WITH RADIOACTIVE SEED LOCALIZATION;  Surgeon: Caralyn Chandler, MD;  Location: [facility];  Service: General;  Laterality: Left;   CESAREAN SECTION     DG GALL BLADDER     FOOT SURGERY     Patient Active Problem List   Diagnosis Date Noted   At high risk for breast cancer 03/03/2020   Atypical ductal hyperplasia of left breast 11/27/2019   Essential hypertension 04/05/2017   Chest pain 03/06/2017   Family history of heart disease 03/06/2017    PCP: Jolynn Needy, PA  REFERRING PROVIDER: Saundra Curl, MD  REFERRING DIAG: rt TKR on 10/03/2023  THERAPY DIAG:  Right knee pain, unspecified chronicity  Difficulty in walking, not elsewhere classified  Stiffness of right knee, not elsewhere classified  Muscle weakness (generalized)  Rationale for Evaluation and Treatment: Rehabilitation  ONSET DATE: s/p  10/03/23  SUBJECTIVE:    SUBJECTIVE STATEMENT: Reports she [place] yesterday and reports increased swelling and turned blue following.  No reports of pain currently.  Wants to begin walking with cane, owns a SPC.  EVAL:S/p right TKA 10/03/23 per Dr. Abigail Abler; arrives with RW and waterproof bandage in place; TED hose  PERTINENT HISTORY: Here previous for knee pain PAIN:  Are you having pain? Yes: NPRS scale: 3-7/10 Pain location: right knee Pain description: throbbing, tight Aggravating factors: bending, movemenet Relieving factors: ice rest, medication  PRECAUTIONS: None  RED FLAGS: None   WEIGHT BEARING RESTRICTIONS: No  FALLS:  Has patient fallen in last 6 months? Yes. Number of falls 1 prior to surgery   OCCUPATION: retired but works some at [employer]  PLOF: Independent  PATIENT GOALS: walk normally without walker  NEXT MD VISIT: last week of May  OBJECTIVE:  Note: Objective measures were completed at Evaluation unless otherwise noted.  DIAGNOSTIC FINDINGS: none  PATIENT SURVEYS:  LEFS next visit 10/10/23 LEFS 8/80 10%  COGNITION: Overall cognitive status: Within functional limits for tasks assessed     SENSATION: WFL  EDEMA:  Normal for this time s/p   PALPATION: General soreness  LOWER EXTREMITY ROM: very guarded  Active ROM Right eval Left eval Right 10/10/23 Right 10/15/23 Right  10/17/23 Right  10/22/23 Right 11/01/23 Right 11/05/23:  Hip flexion  Hip extension          Hip abduction          Hip adduction          Hip internal rotation          Hip external rotation          Knee flexion 32  65 AAROM 74 AAROM 75 AAROM 72 90   Knee extension -15   -8 -10 -8 -5   Ankle dorsiflexion Decreased versus left         Ankle plantarflexion          Ankle inversion          Ankle eversion           (Blank rows = not tested)  LOWER EXTREMITY MMT:  MMT Right eval Left eval  Hip flexion 2   Hip extension    Hip abduction    Hip  adduction    Hip internal rotation    Hip external rotation    Knee flexion    Knee extension 2 (poor quad set)   Ankle dorsiflexion    Ankle plantarflexion    Ankle inversion    Ankle eversion     (Blank rows = not tested)  FUNCTIONAL TESTS:  5 times sit to stand: 34.98 sec using hands to assist and reliant on left leg 2 minute walk test: 32 ft with RW  GAIT: Distance walked: 32 ft Assistive device utilized: Environmental consultant - 2 wheeled Level of assistance: SBA Comments: slow pace; step to gait                                                                                                                                TREATMENT DATE: 11/05/23 STM to right knee and manual ROM to improve right knee mobility x 15' Supine: - Heel slides with rope AAROM 10x - Quad sets 10x AROM 5-94 degres - Hamstring stretch with rope 3x 30" Standing: - TKE GTB 5x 5" - TKE pushing ball into wall 10x 5" Gait training initially in // bars with 1 hand and cueing for heel to toe mechanics, toe push off, equal stance and stride length Gait training SPC 3 point sequence x 99ft min guard   11/01/23 STM to right knee and manual ROM to improve right knee mobility x 15' AROM right knee -5 to 90 today seated Standing: Heel and toe raises 2 x 10 8" step knee drives for flexion x 2' TKE blue theraband x 20 8" box hamstring stretch 5 x 20" Slant board stretch 5 x 20" Updated HEP   10/30/23 Stepper 5 minutes with cues for full TKE and gradual increase in flexion AAROM as tolerated  Gait training over hurdles in // bars (3 laps with knee flexion, hip flexion, knee extension with toe up, heel strike cuing) Gait training 160' w/ RW and cues from above as well as continual propulsion of  RW in order to reduce WB in UE and increased WB in SLS on surgical knee for quad activation/mm activation TA: Education on continuing to perform TKE with patellar glides per PT performed and understanding noted with functional  quad activation needed for optimal gait and weight acceptance  Performance of quad set and knee flexion/extension in semi supine needed for progression of ROM needed for functional activities  10/24/23 Bike seat 8 rocking 5 minutes (AAROM first couple motions each way) Standing Rt knee flexion stretch on 12" step 10X10"  Rt knee flexion 10X Supine: STM to Rt knee; retro for edema  QS 5X3"  SLR AAROM with quad set 5X  SLR AROM with QS/slight extension lag 2X   10/22/23 Supine: STM to right knee and manual ROM to improve right knee mobility x 15' AROM right knee -8 to 72 today Seated  Knee dangle AAROM knee flexion x 10 reps Gait training in // bars  10/17/23: -Seated Heel Slides, 10x, AAROM with towel under foot and use of LLE -Supine Quad sets, 1/2 foam roll under knee, 5" holds, 2x10 -Supine SAQ, full foam roll under knee, little foot clearance, therapist muscle tapping to help engage quad with some improvement -Seated LAQ, 10x, very little foot clearance from floor  -Supine Glute Set, 5" holds, 2x10 -ROM measurement -STM to posterior knee and calf musculature, 8' -Very gentle massage around edges of scar  10/15/23 Supine: STM followed by manual ROM to right knee and calf to decrease pain and improve joint mobility and soft tissue extensibility x 15' Supine: Quad sets 5" x 10 Ankle pumps x 15 SAQ's on 1/2 foam roll with assist Seated AAROM right knee flexion x 10 followed by dangle Seated heel slide x 10 Standing Marching x 5 each  10/10/23  LEFS 8/80 Review of HEP and goals  Supine: STM followed by manual ROM to right knee and calf to decrease pain and improve joint mobility and soft tissue extensibility x 15' AAROM right knee flexion 65 degrees Quad sets 5" hold x 10 (poor quad set) Trial of SAQ's AAROM x 5 Heel slides with strap x 10 Seated heel/toe raises Seated calf stretch 5 x 20" Standing calf stretch 2 x 10"  10/07/22 physical therapy evaluation and HEP      PATIENT EDUCATION:  Education details: Patient educated on exam findings, POC, scope of PT, HEP, and what to expect next visit. Person educated: Patient Education method: Explanation, Demonstration, and Handouts Education comprehension: verbalized understanding, returned demonstration, verbal cues required, and tactile cues required  HOME EXERCISE PROGRAM: 11/05/23: - desensitization techniques on incision - TKE with ball against wall.  11/01/23 - Standing Hamstring Stretch with Step  - 2 x daily - 7 x weekly - 1 sets - 5 reps - 20" hold - knee bends on the step using rails to hold onto  - 2 x daily - 7 x weekly - 3 sets - 10 reps - 5 sec hold  Access Code: [email or web address] URL: [email or web address] Date: 10/07/2023 Prepared by: [facility] - Rehab  Exercises - Supine Ankle Pumps  - 2-3 x daily - 7 x weekly - 2 sets - 10-20 reps - Supine Heel Slide  - 2-3 x daily - 7 x weekly - 3 sets - 10-20 reps - 5 hold - Seated Knee Flexion  - 2-3 x daily - 7 x weekly - 3 sets - 10-20 reps - 10 hold - Seated Long Arc Quad  - 2-3 x daily - 7 x weekly -  3 sets - 10-20 reps - Supine Quad Set  - 2-3 x daily - 7 x weekly - 3 sets - 10 reps - Supine Heel Slide with Strap  - 2-3 x daily - 7 x weekly - 3 sets - 10 reps - Seated Knee Extension AAROM  - 2-3 x daily - 7 x weekly - 3 sets - 10 reps - Supine Knee Extension Stretch on Towel Roll  - 2-3 x daily - 7 x weekly - 3 sets - 10 reps  ASSESSMENT:  CLINICAL IMPRESSION: Today's session with continued focus on right knee mobility; some strengthening.  Patient with slowly improving knee mobility; able to get to 90 flexion today in supine.  Noted continues with extension lag more prevalent weightbearing activities so added theraband TKE's.  Stretching of hamstrings and calves added to loosen posterior chain to allow for more knee extension.  Patient still quite reliant on walker so we need to work on weaning her to cane.  Patient will benefit from  continued skilled therapy services to address deficits and promote return to optimal function.  Session focus with knee mobility and proximal strengthening.  Began session with manual to address edema proximal knee to assist with mobility.  Reviewed benefits with compression garments, pt aware of benefits but reports she is unable to tolerate sheets against incision and doesn't feel she could tolerate.  Pt educated on desensitization techniques to improve tolerance with sheets and clothing onto incision.  Therex focus on quad activation and stretches for mobility to assist with knee extension.  Encouraged pt to increase quad sets at home and heel slides.  Gait training complete with SPC, pt able to demonstrate good 3 point sequence with no LOB episodes.  Pt very hesitant with LRAD, encouraged to continue with walker and will practice with therapy.  Added TKE to HEP with handout given.   Eval: Patient is a 68 y.o. female who was seen today for physical therapy evaluation and treatment for s/p Right TKA.  Patient demonstrates muscle weakness, reduced ROM, and fascial restrictions which are likely contributing to symptoms of pain and are negatively impacting patient ability to perform ADLs and functional mobility tasks. Patient will benefit from skilled physical therapy services to address these deficits to reduce pain and improve level of function with ADLs and functional mobility tasks.   OBJECTIVE IMPAIRMENTS: Abnormal gait, decreased activity tolerance, decreased mobility, difficulty walking, decreased ROM, decreased strength, increased fascial restrictions, and pain.   ACTIVITY LIMITATIONS: carrying, lifting, bending, sitting, standing, squatting, sleeping, stairs, transfers, bed mobility, locomotion level, and caring for others  PARTICIPATION LIMITATIONS: meal prep, cleaning, laundry, driving, shopping, community activity, and yard work  [facility] POTENTIAL: Good  CLINICAL DECISION MAKING:  Evolving/moderate complexity  EVALUATION COMPLEXITY: Moderate   GOALS: Goals reviewed with patient? No  SHORT TERM GOALS: Target date: 10/28/23 patient will be independent with initial HEP   Baseline: Goal status: in progress  2.  Patient will report 50% improvement overall  Baseline:  Goal status: in progress   3.  Patient will increase right knee flexion to 90 to prepare for step navigation Baseline: 32 Goal status: in progress    LONG TERM GOALS: Target date: 11/22/23  Patient will be independent in self management strategies to improve quality of life and functional outcomes.  Baseline:  Goal status: in progress   2.  Patient will report 75% improvement overall  Baseline:  Goal status: in progress   3.  Patient will improve LEFS score by  10  points to demonstrate improved perceived function   Baseline: 8/80 Goal status: in progress   4.   Patient will increase right leg MMT's to 5/5 to allow navigation of steps without gait deviation or loss of balance  Baseline: see above Goal status: in progress   5.  Patient will increase knee mobility to -2 to 120 to promote normal navigation of steps; step over step pattern  Baseline: see above Goal status: in progress   PLAN:  PT FREQUENCY: 2x/week  PT DURATION: 6 weeks  PLANNED INTERVENTIONS: 97164- PT Re-evaluation, 97110-Therapeutic exercises, 97530- Therapeutic activity, 97112- Neuromuscular re-education, 97535- Self Care, [id number]- Manual therapy, [phone]- Gait training, [phone]- Orthotic Fit/training, [phone]- Canalith repositioning, [id number]- Aquatic Therapy, [phone]- Splinting, Patient/Family education, Balance training, Stair training, Taping, Dry Needling, Joint mobilization, Joint manipulation, Spinal manipulation, Spinal mobilization, Scar mobilization, and DME instructions.   PLAN FOR NEXT SESSION:  progress knee mobility and then strength; gait training with SPC.  10th visit progress note.  Minor Amble,  LPTA/CLT; CBIS [phone]  4:18 PM, 11/05/23

## 2023-11-07 ENCOUNTER — Ambulatory Visit (HOSPITAL_COMMUNITY): Admitting: Physical Therapy

## 2023-11-07 DIAGNOSIS — M25561 Pain in right knee: Secondary | ICD-10-CM | POA: Diagnosis not present

## 2023-11-07 DIAGNOSIS — M6281 Muscle weakness (generalized): Secondary | ICD-10-CM

## 2023-11-07 DIAGNOSIS — M25661 Stiffness of right knee, not elsewhere classified: Secondary | ICD-10-CM

## 2023-11-07 DIAGNOSIS — R262 Difficulty in walking, not elsewhere classified: Secondary | ICD-10-CM

## 2023-11-07 NOTE — Therapy (Signed)
 OUTPATIENT PHYSICAL THERAPY LOWER EXTREMITY TREATMENT Progress Note Reporting Period 10/07/23 to 11/07/23  See note below for Objective Data and Assessment of Progress/Goals.      Patient Name: Amanda Roberts MRN: [id number] DOB:Feb 27, 1956, 68 y.o., female Today's Date: 11/07/2023  END OF SESSION:  PT End of Session - 11/07/23 1115     Visit Number 10    Number of Visits 12    Date for PT Re-Evaluation 11/22/23    Authorization Type [employer]    Authorization Time Period no auth; no limit    Progress Note Due on Visit 10    PT Start Time 0930    PT Stop Time 1015    PT Time Calculation (min) 45 min    Equipment Utilized During Treatment Gait belt    Activity Tolerance Patient limited by pain;Patient tolerated treatment well    Behavior During Therapy WFL for tasks assessed/performed                Past Medical History:  Diagnosis Date   Asthma    Hypertension    PONV (postoperative nausea and vomiting)    Past Surgical History:  Procedure Laterality Date   ABDOMINAL HYSTERECTOMY     BREAST EXCISIONAL BIOPSY     BREAST LUMPECTOMY WITH RADIOACTIVE SEED LOCALIZATION Left 10/23/2019   Procedure: LEFT BREAST LUMPECTOMY WITH RADIOACTIVE SEED LOCALIZATION;  Surgeon: Caralyn Chandler, MD;  Location: [facility];  Service: General;  Laterality: Left;   CESAREAN SECTION     DG GALL BLADDER     FOOT SURGERY     Patient Active Problem List   Diagnosis Date Noted   At high risk for breast cancer 03/03/2020   Atypical ductal hyperplasia of left breast 11/27/2019   Essential hypertension 04/05/2017   Chest pain 03/06/2017   Family history of heart disease 03/06/2017    PCP: Jolynn Needy, PA  REFERRING PROVIDER: Saundra Curl, MD  REFERRING DIAG: rt TKR on 10/03/2023  THERAPY DIAG:  Right knee pain, unspecified chronicity  Difficulty in walking, not elsewhere classified  Stiffness of right knee, not elsewhere classified  Muscle  weakness (generalized)  Rationale for Evaluation and Treatment: Rehabilitation  ONSET DATE: s/p  10/03/23  SUBJECTIVE:   SUBJECTIVE STATEMENT: Pt states her Rt LE continues to swell and sometimes discolored due to the edema.  Overall she is improving and wants to get to where she can use the cane.   EVAL:S/p right TKA 10/03/23 per Dr. Abigail Abler; arrives with RW and waterproof bandage in place; TED hose  PERTINENT HISTORY: Here previous for knee pain PAIN:  Are you having pain? Yes: NPRS scale: 3-7/10 Pain location: right knee Pain description: throbbing, tight Aggravating factors: bending, movemenet Relieving factors: ice rest, medication  PRECAUTIONS: None  RED FLAGS: None   WEIGHT BEARING RESTRICTIONS: No  FALLS:  Has patient fallen in last 6 months? Yes. Number of falls 1 prior to surgery   OCCUPATION: retired but works some at [employer]  PLOF: Independent  PATIENT GOALS: walk normally without walker  NEXT MD VISIT: last week of May  OBJECTIVE:  Note: Objective measures were completed at Evaluation unless otherwise noted.  DIAGNOSTIC FINDINGS: none  PATIENT SURVEYS:  LEFS next visit 10/10/23 LEFS 8/80 10%  11/07/23 LEFS 26/80 32.5%  COGNITION: Overall cognitive status: Within functional limits for tasks assessed     SENSATION: WFL  EDEMA:  Normal for this time s/p   PALPATION: General soreness  LOWER EXTREMITY  ROM: very guarded  Active ROM Right eval Left eval Right 10/10/23 Right 10/15/23 Right  10/17/23 Right  10/22/23 Right 11/01/23 Right 11/05/23:  Hip flexion          Hip extension          Hip abduction          Hip adduction          Hip internal rotation          Hip external rotation          Knee flexion 32  65 AAROM 74 AAROM 75 AAROM 72 90 90  Knee extension -15   -8 -10 -8 -5 -5  Ankle dorsiflexion Decreased versus left         Ankle plantarflexion          Ankle inversion          Ankle eversion           (Blank rows =  not tested)  LOWER EXTREMITY MMT:  MMT Right eval Right 11/07/23  Hip flexion 2 4  Hip extension    Hip abduction    Hip adduction    Hip internal rotation    Hip external rotation    Knee flexion    Knee extension 2 (poor quad set) 3-  Ankle dorsiflexion    Ankle plantarflexion    Ankle inversion    Ankle eversion     (Blank rows = not tested)  FUNCTIONAL TESTS:  Evaluation 5 times sit to stand: 34.98 sec using hands to assist and reliant on left leg 2 minute walk test: 32 ft with RW 11/07/23  5X STS no UE (leaning to Lt) 12.38" (was 34.98 using UE)  125' with RW (was 32 ft) with SPC 40' in 2 minutes   GAIT: Distance walked: 32 ft Assistive device utilized: Environmental consultant - 2 wheeled Level of assistance: SBA Comments: slow pace; step to gait                                                                                                                                TREATMENT DATE: 11/07/23 Progress note completed with SPC 40 feet (walked total of 100 foot) with RW 125 feet (was 32 feet with RW) 5X STS no UE (leaning to Lt) 12.38" (was 34.98 using UE) LEFS 26/80 32.5% was 8/80 10%) MMT (see above) AROM -5 to 90 degrees (was -15 to 32 at eval) Goal review, gait training with [facility] Desensitization education/compression education Supine SAQ with 1/2 roll 10X5"  11/05/23 STM to right knee and manual ROM to improve right knee mobility x 15' Supine: - Heel slides with rope AAROM 10x - Quad sets 10x AROM 5-94 degres - Hamstring stretch with rope 3x 30" Standing: - TKE GTB 5x 5" - TKE pushing ball into wall 10x 5" Gait training initially in // bars with 1 hand and cueing for heel to  toe mechanics, toe push off, equal stance and stride length Gait training SPC 3 point sequence x 75ft min guard   11/01/23 STM to right knee and manual ROM to improve right knee mobility x 15' AROM right knee -5 to 90 today seated Standing: Heel and toe raises 2 x 10 8" step  knee drives for flexion x 2' TKE blue theraband x 20 8" box hamstring stretch 5 x 20" Slant board stretch 5 x 20" Updated HEP     PATIENT EDUCATION:  Education details: Patient educated on exam findings, POC, scope of PT, HEP, and what to expect next visit. Person educated: Patient Education method: Explanation, Demonstration, and Handouts Education comprehension: verbalized understanding, returned demonstration, verbal cues required, and tactile cues required  HOME EXERCISE PROGRAM: 11/05/23: - desensitization techniques on incision - TKE with ball against wall.  11/01/23 - Standing Hamstring Stretch with Step  - 2 x daily - 7 x weekly - 1 sets - 5 reps - 20" hold - knee bends on the step using rails to hold onto  - 2 x daily - 7 x weekly - 3 sets - 10 reps - 5 sec hold  Access Code: [email or web address] URL: [email or web address] Date: 10/07/2023 Prepared by: AP - Rehab  Exercises - Supine Ankle Pumps  - 2-3 x daily - 7 x weekly - 2 sets - 10-20 reps - Supine Heel Slide  - 2-3 x daily - 7 x weekly - 3 sets - 10-20 reps - 5 hold - Seated Knee Flexion  - 2-3 x daily - 7 x weekly - 3 sets - 10-20 reps - 10 hold - Seated Long Arc Quad  - 2-3 x daily - 7 x weekly - 3 sets - 10-20 reps - Supine Quad Set  - 2-3 x daily - 7 x weekly - 3 sets - 10 reps - Supine Heel Slide with Strap  - 2-3 x daily - 7 x weekly - 3 sets - 10 reps - Seated Knee Extension AAROM  - 2-3 x daily - 7 x weekly - 3 sets - 10 reps - Supine Knee Extension Stretch on Towel Roll  - 2-3 x daily - 7 x weekly - 3 sets - 10 reps  ASSESSMENT:  CLINICAL IMPRESSION: Progress note completed this session with significant gains made in function, strength and ROM.  Pt has met 2/3 short term goals and 1/5 long term goals. Pt continues to be most limited by edema in Lt LE and overall lack of confidence.  PT educated on completing SAQ to further strengthen quadriceps, how beneficial compression garment would be (pt refuses  to attempt to don one) and desensitization techniques.  Pt was given handout on this as well.  Used SPC while in therapy today with pt only having one LOB that was self corrected.  PT uses step-to gait with SPC currently and fearful requesting gait belt and CGA from therapist.  Patient will benefit from continued skilled therapy services to address deficits and promote return to optimal function.  Session focus with knee mobility and proximal strengthening.  Began session with manual to address edema proximal knee to assist with mobility.  Reviewed benefits with compression garments, pt aware of benefits but reports she is unable to tolerate sheets against incision and doesn't feel she could tolerate.  Pt educated on desensitization techniques to improve tolerance with sheets and clothing onto incision.  Therex focus on quad activation and stretches for mobility to assist with knee extension.  Encouraged pt  to increase quad sets at home and heel slides.  Gait training complete with SPC, pt able to demonstrate good 3 point sequence with no LOB episodes.  Pt very hesitant with LRAD, encouraged to continue with walker and will practice with therapy.  Added TKE to HEP with handout given.   Eval: Patient is a 68 y.o. female who was seen today for physical therapy evaluation and treatment for s/p Right TKA.  Patient demonstrates muscle weakness, reduced ROM, and fascial restrictions which are likely contributing to symptoms of pain and are negatively impacting patient ability to perform ADLs and functional mobility tasks. Patient will benefit from skilled physical therapy services to address these deficits to reduce pain and improve level of function with ADLs and functional mobility tasks.   OBJECTIVE IMPAIRMENTS: Abnormal gait, decreased activity tolerance, decreased mobility, difficulty walking, decreased ROM, decreased strength, increased fascial restrictions, and pain.   ACTIVITY LIMITATIONS: carrying,  lifting, bending, sitting, standing, squatting, sleeping, stairs, transfers, bed mobility, locomotion level, and caring for others  PARTICIPATION LIMITATIONS: meal prep, cleaning, laundry, driving, shopping, community activity, and yard work  [facility] POTENTIAL: Good  CLINICAL DECISION MAKING: Evolving/moderate complexity  EVALUATION COMPLEXITY: Moderate   GOALS: Goals reviewed with patient? Yes  SHORT TERM GOALS: Target date: 10/28/23 patient will be independent with initial HEP Baseline: Goal status: MET  2.  Patient will report 50% improvement overall Baseline:  Goal status: in progress   3.  Patient will increase right knee flexion to 90 to prepare for step navigation Baseline: 32 Goal status: MET    LONG TERM GOALS: Target date: 11/22/23  Patient will be independent in self management strategies to improve quality of life and functional outcomes.  Baseline:  Goal status: in progress   2.  Patient will report 75% improvement overall  Baseline:  Goal status: in progress   3.  Patient will improve LEFS score by 10  points to demonstrate improved perceived function   Baseline: 8/80 Goal status: MET  4.   Patient will increase right leg MMT's to 5/5 to allow navigation of steps without gait deviation or loss of balance  Baseline: see above Goal status: in progress   5.  Patient will increase knee mobility to -2 to 120 to promote normal navigation of steps; step over step pattern  Baseline: see above Goal status: in progress   PLAN:  PT FREQUENCY: 2x/week  PT DURATION: 6 weeks  PLANNED INTERVENTIONS: 97164- PT Re-evaluation, 97110-Therapeutic exercises, 97530- Therapeutic activity, 97112- Neuromuscular re-education, 97535- Self Care, 91478- Manual therapy, [phone]- Gait training, [phone]- Orthotic Fit/training, [phone]- Canalith repositioning, [id number]- Aquatic Therapy, [phone]- Splinting, Patient/Family education, Balance training, Stair training, Taping, Dry  Needling, Joint mobilization, Joint manipulation, Spinal manipulation, Spinal mobilization, Scar mobilization, and DME instructions.   PLAN FOR NEXT SESSION:  progress knee mobility and then strength; gait training with SPC.    Lorenso Romance, PTA/CLT [facility] Ph: [phone]  2:02 PM, 11/07/23

## 2023-11-12 ENCOUNTER — Ambulatory Visit (HOSPITAL_COMMUNITY)

## 2023-11-12 DIAGNOSIS — M25661 Stiffness of right knee, not elsewhere classified: Secondary | ICD-10-CM

## 2023-11-12 DIAGNOSIS — R262 Difficulty in walking, not elsewhere classified: Secondary | ICD-10-CM

## 2023-11-12 DIAGNOSIS — M25561 Pain in right knee: Secondary | ICD-10-CM

## 2023-11-12 DIAGNOSIS — M6281 Muscle weakness (generalized): Secondary | ICD-10-CM

## 2023-11-12 NOTE — Therapy (Signed)
 OUTPATIENT PHYSICAL THERAPY LOWER EXTREMITY TREATMENT   Patient Name: Amanda Roberts MRN: [id number] DOB:January 03, 1956, 68 y.o., female Today's Date: 11/12/2023  END OF SESSION:  PT End of Session - 11/12/23 0855     Visit Number 11    Number of Visits 12    Date for PT Re-Evaluation 11/22/23    Authorization Type [employer]    Authorization Time Period no auth; no limit    Progress Note Due on Visit 20   PN complete visit #10   PT Start Time 0853    PT Stop Time 0935    PT Time Calculation (min) 42 min    Activity Tolerance Patient tolerated treatment well    Behavior During Therapy WFL for tasks assessed/performed                Past Medical History:  Diagnosis Date   Asthma    Hypertension    PONV (postoperative nausea and vomiting)    Past Surgical History:  Procedure Laterality Date   ABDOMINAL HYSTERECTOMY     BREAST EXCISIONAL BIOPSY     BREAST LUMPECTOMY WITH RADIOACTIVE SEED LOCALIZATION Left 10/23/2019   Procedure: LEFT BREAST LUMPECTOMY WITH RADIOACTIVE SEED LOCALIZATION;  Surgeon: Caralyn Chandler, MD;  Location: [facility];  Service: General;  Laterality: Left;   CESAREAN SECTION     DG GALL BLADDER     FOOT SURGERY     Patient Active Problem List   Diagnosis Date Noted   At high risk for breast cancer 03/03/2020   Atypical ductal hyperplasia of left breast 11/27/2019   Essential hypertension 04/05/2017   Chest pain 03/06/2017   Family history of heart disease 03/06/2017    PCP: Jolynn Needy, PA  REFERRING PROVIDER: Saundra Curl, MD  REFERRING DIAG: rt TKR on 10/03/2023  THERAPY DIAG:  Right knee pain, unspecified chronicity  Difficulty in walking, not elsewhere classified  Stiffness of right knee, not elsewhere classified  Muscle weakness (generalized)  Rationale for Evaluation and Treatment: Rehabilitation  ONSET DATE: s/p 11/13/23  SUBJECTIVE:   SUBJECTIVE STATEMENT: Pt arrived with compression  garments, no reports of pain currently just stiffness.  Has began the desensitization techniques at home.  Completing HEP daily.  Returns to MD tomorrow.  EVAL:S/p right TKA 10/03/23 per Dr. Abigail Abler; arrives with RW and waterproof bandage in place; TED hose  PERTINENT HISTORY: Here previous for knee pain PAIN:  Are you having pain? Yes: NPRS scale: 3-7/10 Pain location: right knee Pain description: throbbing, tight Aggravating factors: bending, movemenet Relieving factors: ice rest, medication  PRECAUTIONS: None  RED FLAGS: None   WEIGHT BEARING RESTRICTIONS: No  FALLS:  Has patient fallen in last 6 months? Yes. Number of falls 1 prior to surgery   OCCUPATION: retired but works some at [employer]  PLOF: Independent  PATIENT GOALS: walk normally without walker  NEXT MD VISIT: last week of May  OBJECTIVE:  Note: Objective measures were completed at Evaluation unless otherwise noted.  DIAGNOSTIC FINDINGS: none  PATIENT SURVEYS:  LEFS next visit 10/10/23 LEFS 8/80 10%  11/07/23 LEFS 26/80 32.5%  COGNITION: Overall cognitive status: Within functional limits for tasks assessed     SENSATION: WFL  EDEMA:  Normal for this time s/p   PALPATION: General soreness  LOWER EXTREMITY ROM: very guarded  Active ROM Right eval Left eval Right 10/10/23 Right 10/15/23 Right  10/17/23 Right  10/22/23 Right 11/01/23 Right 11/05/23:  Hip flexion  Hip extension          Hip abduction          Hip adduction          Hip internal rotation          Hip external rotation          Knee flexion 32  65 AAROM 74 AAROM 75 AAROM 72 90 90  Knee extension -15   -8 -10 -8 -5 -5  Ankle dorsiflexion Decreased versus left         Ankle plantarflexion          Ankle inversion          Ankle eversion           (Blank rows = not tested)  LOWER EXTREMITY MMT:  MMT Right eval Right 11/07/23  Hip flexion 2 4  Hip extension    Hip abduction    Hip adduction    Hip  internal rotation    Hip external rotation    Knee flexion    Knee extension 2 (poor quad set) 3-  Ankle dorsiflexion    Ankle plantarflexion    Ankle inversion    Ankle eversion     (Blank rows = not tested)  FUNCTIONAL TESTS:  Evaluation 5 times sit to stand: 34.98 sec using hands to assist and reliant on left leg 2 minute walk test: 32 ft with RW 11/07/23  5X STS no UE (leaning to Lt) 12.38" (was 34.98 using UE)  125' with RW (was 32 ft) with SPC 40' in 2 minutes   GAIT: Distance walked: 32 ft Assistive device utilized: Environmental consultant - 2 wheeled Level of assistance: SBA Comments: slow pace; step to gait                                                                                                                                TREATMENT DATE: 11/12/23: Pt able to donn thigh high compression garments I Supine: Heel slides Quad sets 10x AROM 5-90 degrees Prone: Prone knee hang x 2 min Quad stretch with rope 3x 30" Hamstring curls 10x Bike rocking x 5'  11/07/23 Progress note completed with SPC 40 feet (walked total of 100 foot) with RW 125 feet (was 32 feet with RW) 5X STS no UE (leaning to Lt) 12.38" (was 34.98 using UE) LEFS 26/80 32.5% was 8/80 10%) MMT (see above) AROM -5 to 90 degrees (was -15 to 32 at eval) Goal review, gait training with [facility] Desensitization education/compression education Supine SAQ with 1/2 roll 10X5"  11/05/23 STM to right knee and manual ROM to improve right knee mobility x 15' Supine: - Heel slides with rope AAROM 10x - Quad sets 10x AROM 5-94 degres - Hamstring stretch with rope 3x 30" Standing: - TKE GTB 5x 5" - TKE pushing ball into wall 10x 5" Gait training initially in // bars with 1 hand and  cueing for heel to toe mechanics, toe push off, equal stance and stride length Gait training SPC 3 point sequence x 47ft min guard   11/01/23 STM to right knee and manual ROM to improve right knee mobility x 15' AROM right  knee -5 to 90 today seated Standing: Heel and toe raises 2 x 10 8" step knee drives for flexion x 2' TKE blue theraband x 20 8" box hamstring stretch 5 x 20" Slant board stretch 5 x 20" Updated HEP     PATIENT EDUCATION:  Education details: Patient educated on exam findings, POC, scope of PT, HEP, and what to expect next visit. Person educated: Patient Education method: Explanation, Demonstration, and Handouts Education comprehension: verbalized understanding, returned demonstration, verbal cues required, and tactile cues required  HOME EXERCISE PROGRAM: 11/12/23: - Prone Knee Extension Hang  - 2 x daily - 7 x weekly - 1 sets - 1 reps - 2'+ hold  11/05/23: - desensitization techniques on incision - TKE with ball against wall.  11/01/23 - Standing Hamstring Stretch with Step  - 2 x daily - 7 x weekly - 1 sets - 5 reps - 20" hold - knee bends on the step using rails to hold onto  - 2 x daily - 7 x weekly - 3 sets - 10 reps - 5 sec hold  Access Code: [email or web address] URL: [email or web address] Date: 10/07/2023 Prepared by: AP - Rehab  Exercises - Supine Ankle Pumps  - 2-3 x daily - 7 x weekly - 2 sets - 10-20 reps - Supine Heel Slide  - 2-3 x daily - 7 x weekly - 3 sets - 10-20 reps - 5 hold - Seated Knee Flexion  - 2-3 x daily - 7 x weekly - 3 sets - 10-20 reps - 10 hold - Seated Long Arc Quad  - 2-3 x daily - 7 x weekly - 3 sets - 10-20 reps - Supine Quad Set  - 2-3 x daily - 7 x weekly - 3 sets - 10 reps - Supine Heel Slide with Strap  - 2-3 x daily - 7 x weekly - 3 sets - 10 reps - Seated Knee Extension AAROM  - 2-3 x daily - 7 x weekly - 3 sets - 10 reps - Supine Knee Extension Stretch on Towel Roll  - 2-3 x daily - 7 x weekly - 3 sets - 10 reps  ASSESSMENT:  CLINICAL IMPRESSION: Pt arrived with compression garments, pt educated on benefits of completing exercises with garments on to assist with edema control for knee mobility.  Pt able to donn independently  without assistance required.  Session focus with knee mobility.  Added prone knee hang and stretches/strengthening exercises with good tolerance.  Added prone knee hang to HEP with printout given.  AROM 5-90 degrees.   Eval: Patient is a 68 y.o. female who was seen today for physical therapy evaluation and treatment for s/p Right TKA.  Patient demonstrates muscle weakness, reduced ROM, and fascial restrictions which are likely contributing to symptoms of pain and are negatively impacting patient ability to perform ADLs and functional mobility tasks. Patient will benefit from skilled physical therapy services to address these deficits to reduce pain and improve level of function with ADLs and functional mobility tasks.   OBJECTIVE IMPAIRMENTS: Abnormal gait, decreased activity tolerance, decreased mobility, difficulty walking, decreased ROM, decreased strength, increased fascial restrictions, and pain.   ACTIVITY LIMITATIONS: carrying, lifting, bending, sitting, standing, squatting, sleeping, stairs, transfers, bed mobility, locomotion level, and  caring for others  PARTICIPATION LIMITATIONS: meal prep, cleaning, laundry, driving, shopping, community activity, and yard work  [facility] POTENTIAL: Good  CLINICAL DECISION MAKING: Evolving/moderate complexity  EVALUATION COMPLEXITY: Moderate   GOALS: Goals reviewed with patient? Yes  SHORT TERM GOALS: Target date: 10/28/23 patient will be independent with initial HEP Baseline: Goal status: MET  2.  Patient will report 50% improvement overall Baseline:  Goal status: in progress   3.  Patient will increase right knee flexion to 90 to prepare for step navigation Baseline: 32 Goal status: MET    LONG TERM GOALS: Target date: 11/22/23  Patient will be independent in self management strategies to improve quality of life and functional outcomes.  Baseline:  Goal status: in progress   2.  Patient will report 75% improvement  overall  Baseline:  Goal status: in progress   3.  Patient will improve LEFS score by 10  points to demonstrate improved perceived function   Baseline: 8/80 Goal status: MET  4.   Patient will increase right leg MMT's to 5/5 to allow navigation of steps without gait deviation or loss of balance  Baseline: see above Goal status: in progress   5.  Patient will increase knee mobility to -2 to 120 to promote normal navigation of steps; step over step pattern  Baseline: see above Goal status: in progress   PLAN:  PT FREQUENCY: 2x/week  PT DURATION: 6 weeks  PLANNED INTERVENTIONS: 97164- PT Re-evaluation, 97110-Therapeutic exercises, 97530- Therapeutic activity, 97112- Neuromuscular re-education, 97535- Self Care, [id number]- Manual therapy, [phone]- Gait training, [phone]- Orthotic Fit/training, [phone]- Canalith repositioning, [id number]- Aquatic Therapy, [phone]- Splinting, Patient/Family education, Balance training, Stair training, Taping, Dry Needling, Joint mobilization, Joint manipulation, Spinal manipulation, Spinal mobilization, Scar mobilization, and DME instructions.   PLAN FOR NEXT SESSION:  progress knee mobility and then strength; gait training with SPC.  MD apt tomorrow.   Minor Amble, LPTA/CLT; CBIS [phone] 10:29 AM, 11/12/23

## 2023-11-14 ENCOUNTER — Ambulatory Visit (HOSPITAL_COMMUNITY): Admitting: Physical Therapy

## 2023-11-14 DIAGNOSIS — M25561 Pain in right knee: Secondary | ICD-10-CM | POA: Diagnosis not present

## 2023-11-14 DIAGNOSIS — M6281 Muscle weakness (generalized): Secondary | ICD-10-CM

## 2023-11-14 DIAGNOSIS — M25661 Stiffness of right knee, not elsewhere classified: Secondary | ICD-10-CM

## 2023-11-14 DIAGNOSIS — R262 Difficulty in walking, not elsewhere classified: Secondary | ICD-10-CM

## 2023-11-14 NOTE — Therapy (Signed)
 OUTPATIENT PHYSICAL THERAPY LOWER EXTREMITY TREATMENT RECERT   Patient Name: Amanda Roberts MRN: [id number] DOB:05/21/1956, 68 y.o., female Today's Date: 11/14/2023  END OF SESSION:  PT End of Session - 11/14/23 1234     Visit Number 12    Number of Visits 24    Date for PT Re-Evaluation 12/26/23    Authorization Type [employer]    Authorization Time Period no auth; no limit    Progress Note Due on Visit 20   PN complete visit #10   Equipment Utilized During Treatment Gait belt    Activity Tolerance Patient tolerated treatment well    Behavior During Therapy WFL for tasks assessed/performed                 Past Medical History:  Diagnosis Date   Asthma    Hypertension    PONV (postoperative nausea and vomiting)    Past Surgical History:  Procedure Laterality Date   ABDOMINAL HYSTERECTOMY     BREAST EXCISIONAL BIOPSY     BREAST LUMPECTOMY WITH RADIOACTIVE SEED LOCALIZATION Left 10/23/2019   Procedure: LEFT BREAST LUMPECTOMY WITH RADIOACTIVE SEED LOCALIZATION;  Surgeon: Caralyn Chandler, MD;  Location: [facility];  Service: General;  Laterality: Left;   CESAREAN SECTION     DG GALL BLADDER     FOOT SURGERY     Patient Active Problem List   Diagnosis Date Noted   At high risk for breast cancer 03/03/2020   Atypical ductal hyperplasia of left breast 11/27/2019   Essential hypertension 04/05/2017   Chest pain 03/06/2017   Family history of heart disease 03/06/2017    PCP: Jolynn Needy, PA  REFERRING PROVIDER: Saundra Curl, MD  REFERRING DIAG: rt TKR on 10/03/2023  THERAPY DIAG:  Right knee pain, unspecified chronicity  Difficulty in walking, not elsewhere classified  Stiffness of right knee, not elsewhere classified  Muscle weakness (generalized)  Rationale for Evaluation and Treatment: Rehabilitation  ONSET DATE: s/p 11/13/23  SUBJECTIVE:   SUBJECTIVE STATEMENT: Progress note completed 2 sessions ago with recert  today.  Pt arrived with compression garments and using SPC.  States MD told her no more RW; discussed possible manipulation if does not start bending better.  Returns to MD 6/25 and returns to work on 6/16.   EVAL:S/p right TKA 10/03/23 per Dr. Abigail Abler; arrives with RW and waterproof bandage in place; TED hose  PERTINENT HISTORY: Here previous for knee pain PAIN:  Are you having pain? Yes: NPRS scale: 3-7/10 Pain location: right knee Pain description: throbbing, tight Aggravating factors: bending, movemenet Relieving factors: ice rest, medication  PRECAUTIONS: None  RED FLAGS: None   WEIGHT BEARING RESTRICTIONS: No  FALLS:  Has patient fallen in last 6 months? Yes. Number of falls 1 prior to surgery   OCCUPATION: retired but works some at [employer]  PLOF: Independent  PATIENT GOALS: walk normally without walker  NEXT MD VISIT: last week of May  OBJECTIVE:  Note: Objective measures were completed at Evaluation unless otherwise noted.  DIAGNOSTIC FINDINGS: none  PATIENT SURVEYS:  LEFS next visit 10/10/23 LEFS 8/80 10%  11/07/23 LEFS 26/80 32.5%  COGNITION: Overall cognitive status: Within functional limits for tasks assessed     SENSATION: WFL  EDEMA:  Normal for this time s/p   PALPATION: General soreness  LOWER EXTREMITY ROM: very guarded  Active ROM Right eval Left eval Right 10/10/23 Right 10/15/23 Right  10/17/23 Right  10/22/23 Right 11/01/23 Right 11/05/23: Right 11/14/23  Hip flexion           Hip extension           Hip abduction           Hip adduction           Hip internal rotation           Hip external rotation           Knee flexion 32  65 AAROM 74 AAROM 75 AAROM 72 90 90 AROM 90 degrees, AROM 98 degrees  Knee extension -15   -8 -10 -8 -5 -5   Ankle dorsiflexion Decreased versus left          Ankle plantarflexion           Ankle inversion           Ankle eversion            (Blank rows = not tested)  LOWER EXTREMITY  MMT:  MMT Right eval Right 11/07/23  Hip flexion 2 4  Hip extension    Hip abduction    Hip adduction    Hip internal rotation    Hip external rotation    Knee flexion    Knee extension 2 (poor quad set) 3-  Ankle dorsiflexion    Ankle plantarflexion    Ankle inversion    Ankle eversion     (Blank rows = not tested)  FUNCTIONAL TESTS:  Evaluation 5 times sit to stand: 34.98 sec using hands to assist and reliant on left leg 2 minute walk test: 32 ft with RW 11/07/23  5X STS no UE (leaning to Lt) 12.38" (was 34.98 using UE)  125' with RW (was 32 ft) with SPC 40' in 2 minutes   GAIT: Distance walked: 32 ft Assistive device utilized: Environmental consultant - 2 wheeled Level of assistance: SBA Comments: slow pace; step to gait                                                                                                                                TREATMENT DATE: 11/14/23 Bike seat 7 rocking 5 minutes Standing:  foot on 2nd step (14") 10X10" flexion stretch  Rt hamstring curl with end range toe on 4" step 20X Supine:  manual for scar tissue, edema (MFR and soft tissue) Gait with SPC working on larger steps, even stride Stair negotiation 7" with 1 HR step to up/down (given instuction sheet) Prone contract relax, hamstring curls AROM supine 90 degrees, AAROM 98 degrees Seated leg swing (for home)    11/12/23: Pt able to donn thigh high compression garments I Supine: Heel slides Quad sets 10x AROM 5-90 degrees Prone: Prone knee hang x 2 min Quad stretch with rope 3x 30" Hamstring curls 10x Bike rocking x 5'  11/07/23 Progress note completed with SPC 40 feet (walked total of 100 foot) with RW 125 feet (was 32 feet with RW)  5X STS no UE (leaning to Lt) 12.38" (was 34.98 using UE) LEFS 26/80 32.5% was 8/80 10%) MMT (see above) AROM -5 to 90 degrees (was -15 to 32 at eval) Goal review, gait training with [facility] Desensitization education/compression  education Supine SAQ with 1/2 roll 10X5"  11/05/23 STM to right knee and manual ROM to improve right knee mobility x 15' Supine: - Heel slides with rope AAROM 10x - Quad sets 10x AROM 5-94 degres - Hamstring stretch with rope 3x 30" Standing: - TKE GTB 5x 5" - TKE pushing ball into wall 10x 5" Gait training initially in // bars with 1 hand and cueing for heel to toe mechanics, toe push off, equal stance and stride length Gait training SPC 3 point sequence x 25ft min guard   11/01/23 STM to right knee and manual ROM to improve right knee mobility x 15' AROM right knee -5 to 90 today seated Standing: Heel and toe raises 2 x 10 8" step knee drives for flexion x 2' TKE blue theraband x 20 8" box hamstring stretch 5 x 20" Slant board stretch 5 x 20" Updated HEP     PATIENT EDUCATION:  Education details: Patient educated on exam findings, POC, scope of PT, HEP, and what to expect next visit. Person educated: Patient Education method: Explanation, Demonstration, and Handouts Education comprehension: verbalized understanding, returned demonstration, verbal cues required, and tactile cues required  HOME EXERCISE PROGRAM: 11/12/23: - Prone Knee Extension Hang  - 2 x daily - 7 x weekly - 1 sets - 1 reps - 2'+ hold  11/05/23: - desensitization techniques on incision - TKE with ball against wall.  11/01/23 - Standing Hamstring Stretch with Step  - 2 x daily - 7 x weekly - 1 sets - 5 reps - 20" hold - knee bends on the step using rails to hold onto  - 2 x daily - 7 x weekly - 3 sets - 10 reps - 5 sec hold  Access Code: [email or web address] URL: [email or web address] Date: 10/07/2023 Prepared by: [facility] - Rehab  Exercises - Supine Ankle Pumps  - 2-3 x daily - 7 x weekly - 2 sets - 10-20 reps - Supine Heel Slide  - 2-3 x daily - 7 x weekly - 3 sets - 10-20 reps - 5 hold - Seated Knee Flexion  - 2-3 x daily - 7 x weekly - 3 sets - 10-20 reps - 10 hold - Seated Long Arc Quad  - 2-3  x daily - 7 x weekly - 3 sets - 10-20 reps - Supine Quad Set  - 2-3 x daily - 7 x weekly - 3 sets - 10 reps - Supine Heel Slide with Strap  - 2-3 x daily - 7 x weekly - 3 sets - 10 reps - Seated Knee Extension AAROM  - 2-3 x daily - 7 x weekly - 3 sets - 10 reps - Supine Knee Extension Stretch on Towel Roll  - 2-3 x daily - 7 x weekly - 3 sets - 10 reps  ASSESSMENT:  CLINICAL IMPRESSION: Recert completed this session.  Pt arrived with [facility] stating MD told her no more walker.  Worked on improving stride length and step through gait.  Gait trained with stair negotiation using step to gait and 1 HR.  PT able to complete with good techniques and given written instructions per request for completing.  Pt reports anxiety over possible manipulation so worked mainly on improving flexion this session.  Manual completed to  lessen scar tissue and reduce edema in LE.  Improved ROM following with AROM to 90 and AAROM to 98 degrees.  Contract relax completed 1X, however could not tolerate due to not taking her full dose of pain meds prior to session.  Instructed to take next session.  Pt will continue to benefit from skilled therapy with request for 6 more weeks.    Eval: Patient is a 68 y.o. female who was seen today for physical therapy evaluation and treatment for s/p Right TKA.  Patient demonstrates muscle weakness, reduced ROM, and fascial restrictions which are likely contributing to symptoms of pain and are negatively impacting patient ability to perform ADLs and functional mobility tasks. Patient will benefit from skilled physical therapy services to address these deficits to reduce pain and improve level of function with ADLs and functional mobility tasks.   OBJECTIVE IMPAIRMENTS: Abnormal gait, decreased activity tolerance, decreased mobility, difficulty walking, decreased ROM, decreased strength, increased fascial restrictions, and pain.   ACTIVITY LIMITATIONS: carrying, lifting, bending, sitting,  standing, squatting, sleeping, stairs, transfers, bed mobility, locomotion level, and caring for others  PARTICIPATION LIMITATIONS: meal prep, cleaning, laundry, driving, shopping, community activity, and yard work  [facility] POTENTIAL: Good  CLINICAL DECISION MAKING: Evolving/moderate complexity  EVALUATION COMPLEXITY: Moderate   GOALS: Goals reviewed with patient? Yes  SHORT TERM GOALS: Target date: 10/28/23 patient will be independent with initial HEP Baseline: Goal status: MET  2.  Patient will report 50% improvement overall Baseline:  Goal status: in progress   3.  Patient will increase right knee flexion to 90 to prepare for step navigation Baseline: 32 Goal status: MET    LONG TERM GOALS: Target date: 11/22/23  Patient will be independent in self management strategies to improve quality of life and functional outcomes.  Baseline:  Goal status: in progress   2.  Patient will report 75% improvement overall  Baseline:  Goal status: in progress   3.  Patient will improve LEFS score by 10  points to demonstrate improved perceived function   Baseline: 8/80 Goal status: MET  4.   Patient will increase right leg MMT's to 5/5 to allow navigation of steps without gait deviation or loss of balance  Baseline: see above Goal status: in progress   5.  Patient will increase knee mobility to -2 to 120 to promote normal navigation of steps; step over step pattern  Baseline: see above Goal status: in progress   PLAN:  PT FREQUENCY: 2x/week  PT DURATION: 6 weeks  PLANNED INTERVENTIONS: 97164- PT Re-evaluation, 97110-Therapeutic exercises, 97530- Therapeutic activity, 97112- Neuromuscular re-education, 97535- Self Care, [id number]- Manual therapy, [phone]- Gait training, [phone]- Orthotic Fit/training, [phone]- Canalith repositioning, [id number]- Aquatic Therapy, [phone]- Splinting, Patient/Family education, Balance training, Stair training, Taping, Dry Needling, Joint mobilization, Joint  manipulation, Spinal manipulation, Spinal mobilization, Scar mobilization, and DME instructions.   PLAN FOR NEXT SESSION:  progress knee mobility and then strength; gait training with SPC.  Request for 6 more weeks of therapy.   Lorenso Romance, PTA/CLT [facility] Ph: [phone]   12:35 PM, 11/14/23

## 2023-11-15 NOTE — Addendum Note (Signed)
 Addended byLouella Rout S on: 11/15/2023 01:51 PM   Modules accepted: Orders

## 2023-11-20 ENCOUNTER — Ambulatory Visit (HOSPITAL_COMMUNITY): Attending: Orthopedic Surgery | Admitting: Physical Therapy

## 2023-11-20 DIAGNOSIS — M25661 Stiffness of right knee, not elsewhere classified: Secondary | ICD-10-CM | POA: Diagnosis not present

## 2023-11-20 DIAGNOSIS — R262 Difficulty in walking, not elsewhere classified: Secondary | ICD-10-CM | POA: Insufficient documentation

## 2023-11-20 DIAGNOSIS — M6281 Muscle weakness (generalized): Secondary | ICD-10-CM | POA: Insufficient documentation

## 2023-11-20 DIAGNOSIS — M25561 Pain in right knee: Secondary | ICD-10-CM | POA: Insufficient documentation

## 2023-11-20 NOTE — Therapy (Signed)
 OUTPATIENT PHYSICAL THERAPY LOWER EXTREMITY TREATMENT    Patient Name: Amanda Roberts MRN: [id number] DOB:[birth year], 68 y.o., female Today's Date: 11/20/2023  END OF SESSION:  PT End of Session - 11/20/23 0934     Visit Number 13    Number of Visits 24    Date for PT Re-Evaluation 12/26/23    Authorization Type [employer]    Authorization Time Period no auth; no limit    Progress Note Due on Visit 20   PN complete visit #10   PT Start Time 0716    PT Stop Time 0800    PT Time Calculation (min) 44 min    Equipment Utilized During Treatment Gait belt    Activity Tolerance Patient tolerated treatment well    Behavior During Therapy WFL for tasks assessed/performed                  Past Medical History:  Diagnosis Date   Asthma    Hypertension    PONV (postoperative nausea and vomiting)    Past Surgical History:  Procedure Laterality Date   ABDOMINAL HYSTERECTOMY     BREAST EXCISIONAL BIOPSY     BREAST LUMPECTOMY WITH RADIOACTIVE SEED LOCALIZATION Left 10/23/2019   Procedure: LEFT BREAST LUMPECTOMY WITH RADIOACTIVE SEED LOCALIZATION;  Surgeon: Caralyn Chandler, MD;  Location: [facility];  Service: General;  Laterality: Left;   CESAREAN SECTION     DG GALL BLADDER     FOOT SURGERY     Patient Active Problem List   Diagnosis Date Noted   At high risk for breast cancer 03/03/2020   Atypical ductal hyperplasia of left breast 11/27/2019   Essential hypertension 04/05/2017   Chest pain 03/06/2017   Family history of heart disease 03/06/2017    PCP: Jolynn Needy, PA  REFERRING PROVIDER: Saundra Curl, MD  REFERRING DIAG: Rt TKR on 10/03/2023  THERAPY DIAG:  Right knee pain, unspecified chronicity  Difficulty in walking, not elsewhere classified  Stiffness of right knee, not elsewhere classified  Muscle weakness (generalized)  Rationale for Evaluation and Treatment: Rehabilitation  ONSET DATE: s/p 11/13/23  SUBJECTIVE:    SUBJECTIVE STATEMENT: Pt reports she has been working hard on her ROM.  States it feels stiff this morning.   Returns to MD 6/25 and returns to work on 6/16.   EVAL:S/p right TKA 10/03/23 per Dr. Abigail Abler; arrives with RW and waterproof bandage in place; TED hose  PERTINENT HISTORY: Here previous for knee pain PAIN:  Are you having pain? Yes: NPRS scale: 3-7/10 Pain location: right knee Pain description: throbbing, tight Aggravating factors: bending, movemenet Relieving factors: ice rest, medication  PRECAUTIONS: None  RED FLAGS: None   WEIGHT BEARING RESTRICTIONS: No  FALLS:  Has patient fallen in last 6 months? Yes. Number of falls 1 prior to surgery   OCCUPATION: retired but works some at [employer]  PLOF: Independent  PATIENT GOALS: walk normally without walker  NEXT MD VISIT: last week of May  OBJECTIVE:  Note: Objective measures were completed at Evaluation unless otherwise noted.  DIAGNOSTIC FINDINGS: none  PATIENT SURVEYS:  LEFS next visit 10/10/23 LEFS 8/80 10%  11/07/23 LEFS 26/80 32.5%  COGNITION: Overall cognitive status: Within functional limits for tasks assessed     SENSATION: WFL  EDEMA:  Normal for this time s/p   PALPATION: General soreness  LOWER EXTREMITY ROM: very guarded  Active ROM Right eval Left eval Right 10/10/23 Right 10/15/23 Right  10/17/23 Right  10/22/23 Right 11/01/23 Right 11/05/23: Right 11/14/23  Hip flexion           Hip extension           Hip abduction           Hip adduction           Hip internal rotation           Hip external rotation           Knee flexion 32  65 AAROM 74 AAROM 75 AAROM 72 90 90 AROM 90 degrees, AROM 98 degrees  Knee extension -15   -8 -10 -8 -5 -5   Ankle dorsiflexion Decreased versus left          Ankle plantarflexion           Ankle inversion           Ankle eversion            (Blank rows = not tested)  LOWER EXTREMITY MMT:  MMT Right eval Right 11/07/23  Hip flexion  2 4  Hip extension    Hip abduction    Hip adduction    Hip internal rotation    Hip external rotation    Knee flexion    Knee extension 2 (poor quad set) 3-  Ankle dorsiflexion    Ankle plantarflexion    Ankle inversion    Ankle eversion     (Blank rows = not tested)  FUNCTIONAL TESTS:  Evaluation 5 times sit to stand: 34.98 sec using hands to assist and reliant on left leg 2 minute walk test: 32 ft with RW 11/07/23  5X STS no UE (leaning to Lt) 12.38" (was 34.98 using UE)  125' with RW (was 32 ft) with SPC 40' in 2 minutes   GAIT: Distance walked: 32 ft Assistive device utilized: Environmental consultant - 2 wheeled Level of assistance: SBA Comments: slow pace; step to gait                                                                                                                                TREATMENT DATE: 11/20/23 Soft tissue massage, myofascial Rt knee 10 minutes  Rt knee AROM -5 to 95, AAROM 100 degrees Bike seat 7 full revolutions backward then forward 5 minutes Standing:  Rt knee flexion end range with toe on 4" step 10X  Forward lunges onto 4" step no UE 2X10  Knee flexion stretch on 12" step 10X10"  Hip abduction bil 10X Gait with SPC around clinic working on even stride, reduced antalgia 4" stair negotiation reciprocally with 1 HR 3RT   11/14/23 Bike seat 7 rocking 5 minutes Standing:  foot on 2nd step (14") 10X10" flexion stretch  Rt hamstring curl with end range toe on 4" step 20X Supine:  manual for scar tissue, edema (MFR and soft tissue) Gait with SPC working on larger steps, even stride Stair negotiation 7"  with 1 HR step to up/down (given instuction sheet) Prone contract relax, hamstring curls AROM supine 90 degrees, AAROM 98 degrees Seated leg swing (for home)    11/12/23: Pt able to donn thigh high compression garments I Supine: Heel slides Quad sets 10x AROM 5-90 degrees Prone: Prone knee hang x 2 min Quad stretch with rope 3x 30" Hamstring  curls 10x Bike rocking x 5'  11/07/23 Progress note completed with SPC 40 feet (walked total of 100 foot) with RW 125 feet (was 32 feet with RW) 5X STS no UE (leaning to Lt) 12.38" (was 34.98 using UE) LEFS 26/80 32.5% was 8/80 10%) MMT (see above) AROM -5 to 90 degrees (was -15 to 32 at eval) Goal review, gait training with [facility] Desensitization education/compression education Supine SAQ with 1/2 roll 10X5"  11/05/23 STM to right knee and manual ROM to improve right knee mobility x 15' Supine: - Heel slides with rope AAROM 10x - Quad sets 10x AROM 5-94 degres - Hamstring stretch with rope 3x 30" Standing: - TKE GTB 5x 5" - TKE pushing ball into wall 10x 5" Gait training initially in // bars with 1 hand and cueing for heel to toe mechanics, toe push off, equal stance and stride length Gait training SPC 3 point sequence x 7ft min guard   11/01/23 STM to right knee and manual ROM to improve right knee mobility x 15' AROM right knee -5 to 90 today seated Standing: Heel and toe raises 2 x 10 8" step knee drives for flexion x 2' TKE blue theraband x 20 8" box hamstring stretch 5 x 20" Slant board stretch 5 x 20" Updated HEP     PATIENT EDUCATION:  Education details: Patient educated on exam findings, POC, scope of PT, HEP, and what to expect next visit. Person educated: Patient Education method: Explanation, Demonstration, and Handouts Education comprehension: verbalized understanding, returned demonstration, verbal cues required, and tactile cues required  HOME EXERCISE PROGRAM: 11/12/23: - Prone Knee Extension Hang  - 2 x daily - 7 x weekly - 1 sets - 1 reps - 2'+ hold  11/05/23: - desensitization techniques on incision - TKE with ball against wall.  11/01/23 - Standing Hamstring Stretch with Step  - 2 x daily - 7 x weekly - 1 sets - 5 reps - 20" hold - knee bends on the step using rails to hold onto  - 2 x daily - 7 x weekly - 3 sets - 10 reps - 5 sec  hold  Access Code: [email or web address] URL: [email or web address] Date: 10/07/2023 Prepared by: [facility] - Rehab  Exercises - Supine Ankle Pumps  - 2-3 x daily - 7 x weekly - 2 sets - 10-20 reps - Supine Heel Slide  - 2-3 x daily - 7 x weekly - 3 sets - 10-20 reps - 5 hold - Seated Knee Flexion  - 2-3 x daily - 7 x weekly - 3 sets - 10-20 reps - 10 hold - Seated Long Arc Quad  - 2-3 x daily - 7 x weekly - 3 sets - 10-20 reps - Supine Quad Set  - 2-3 x daily - 7 x weekly - 3 sets - 10 reps - Supine Heel Slide with Strap  - 2-3 x daily - 7 x weekly - 3 sets - 10 reps - Seated Knee Extension AAROM  - 2-3 x daily - 7 x weekly - 3 sets - 10 reps - Supine Knee Extension Stretch on Towel Roll  -  2-3 x daily - 7 x weekly - 3 sets - 10 reps  ASSESSMENT:  CLINICAL IMPRESSION: Began session with manual per pt request as states "it is so stiff this morning" . Manual completed followed by AROM and improved flexion to 95 degrees.  Therapist able to assist further flexion to 100 degrees.  Bike completed afterward with ability to make full revolutions backward then forward.   Worked on reciprocal stair negotiation on 4" steps using 1 UE with ability to ascend and descend with only minor gait deviation.  Pt continues to ambulate with stiff Rt LE, encouraging heel to toe with ambulation, even stride and cadence. Began forward lunges without UE to begin working on stability.  No new exercises added to HEP today.  Pt will continue to benefit from skilled therapy.   Eval: Patient is a 68 y.o. female who was seen today for physical therapy evaluation and treatment for s/p Right TKA.  Patient demonstrates muscle weakness, reduced ROM, and fascial restrictions which are likely contributing to symptoms of pain and are negatively impacting patient ability to perform ADLs and functional mobility tasks. Patient will benefit from skilled physical therapy services to address these deficits to reduce pain and improve level of  function with ADLs and functional mobility tasks.   OBJECTIVE IMPAIRMENTS: Abnormal gait, decreased activity tolerance, decreased mobility, difficulty walking, decreased ROM, decreased strength, increased fascial restrictions, and pain.   ACTIVITY LIMITATIONS: carrying, lifting, bending, sitting, standing, squatting, sleeping, stairs, transfers, bed mobility, locomotion level, and caring for others  PARTICIPATION LIMITATIONS: meal prep, cleaning, laundry, driving, shopping, community activity, and yard work  [facility] POTENTIAL: Good  CLINICAL DECISION MAKING: Evolving/moderate complexity  EVALUATION COMPLEXITY: Moderate   GOALS: Goals reviewed with patient? Yes  SHORT TERM GOALS: Target date: 10/28/23 patient will be independent with initial HEP Baseline: Goal status: MET  2.  Patient will report 50% improvement overall Baseline:  Goal status: in progress   3.  Patient will increase right knee flexion to 90 to prepare for step navigation Baseline: 32 Goal status: MET    LONG TERM GOALS: Target date: 11/22/23  Patient will be independent in self management strategies to improve quality of life and functional outcomes.  Baseline:  Goal status: in progress   2.  Patient will report 75% improvement overall  Baseline:  Goal status: in progress   3.  Patient will improve LEFS score by 10  points to demonstrate improved perceived function   Baseline: 8/80 Goal status: MET  4.   Patient will increase right leg MMT's to 5/5 to allow navigation of steps without gait deviation or loss of balance  Baseline: see above Goal status: in progress   5.  Patient will increase knee mobility to -2 to 120 to promote normal navigation of steps; step over step pattern  Baseline: see above Goal status: in progress   PLAN:  PT FREQUENCY: 2x/week  PT DURATION: 6 weeks  PLANNED INTERVENTIONS: 97164- PT Re-evaluation, 97110-Therapeutic exercises, 97530- Therapeutic activity,  97112- Neuromuscular re-education, 97535- Self Care, 96045- Manual therapy, [phone]- Gait training, [phone]- Orthotic Fit/training, [phone]- Canalith repositioning, [id number]- Aquatic Therapy, [phone]- Splinting, Patient/Family education, Balance training, Stair training, Taping, Dry Needling, Joint mobilization, Joint manipulation, Spinal manipulation, Spinal mobilization, Scar mobilization, and DME instructions.   PLAN FOR NEXT SESSION:  progress knee mobility and then strength; gait training with SPC.  Begin static balance challenges including single leg stance/tandem stance.    Lorenso Romance, PTA/CLT [facility]  [facility] Ph: [phone]   9:35 AM, 11/20/23

## 2023-11-21 ENCOUNTER — Encounter (HOSPITAL_COMMUNITY): Payer: Self-pay

## 2023-11-21 ENCOUNTER — Ambulatory Visit (HOSPITAL_COMMUNITY)

## 2023-11-21 DIAGNOSIS — R262 Difficulty in walking, not elsewhere classified: Secondary | ICD-10-CM

## 2023-11-21 DIAGNOSIS — M6281 Muscle weakness (generalized): Secondary | ICD-10-CM

## 2023-11-21 DIAGNOSIS — M25561 Pain in right knee: Secondary | ICD-10-CM

## 2023-11-21 DIAGNOSIS — M25661 Stiffness of right knee, not elsewhere classified: Secondary | ICD-10-CM

## 2023-11-21 NOTE — Therapy (Signed)
 OUTPATIENT PHYSICAL THERAPY LOWER EXTREMITY TREATMENT    Patient Name: Amanda Roberts MRN: [id number] DOB:09-Jan-1956, 68 y.o., female Today's Date: 11/21/2023  END OF SESSION:  PT End of Session - 11/21/23 1344     Visit Number 14    Number of Visits 24    Date for PT Re-Evaluation 12/26/23    Authorization Type [employer]    Authorization Time Period no auth; no limit    Progress Note Due on Visit 20   PN complete visit #10   PT Start Time 1345    PT Stop Time 1428    PT Time Calculation (min) 43 min    Activity Tolerance Patient tolerated treatment well    Behavior During Therapy WFL for tasks assessed/performed                  Past Medical History:  Diagnosis Date   Asthma    Hypertension    PONV (postoperative nausea and vomiting)    Past Surgical History:  Procedure Laterality Date   ABDOMINAL HYSTERECTOMY     BREAST EXCISIONAL BIOPSY     BREAST LUMPECTOMY WITH RADIOACTIVE SEED LOCALIZATION Left 10/23/2019   Procedure: LEFT BREAST LUMPECTOMY WITH RADIOACTIVE SEED LOCALIZATION;  Surgeon: Caralyn Chandler, MD;  Location: [facility];  Service: General;  Laterality: Left;   CESAREAN SECTION     DG GALL BLADDER     FOOT SURGERY     Patient Active Problem List   Diagnosis Date Noted   At high risk for breast cancer 03/03/2020   Atypical ductal hyperplasia of left breast 11/27/2019   Essential hypertension 04/05/2017   Chest pain 03/06/2017   Family history of heart disease 03/06/2017    PCP: Jolynn Needy, PA  REFERRING PROVIDER: Saundra Curl, MD  REFERRING DIAG: Rt TKR on 10/03/2023  THERAPY DIAG:  Right knee pain, unspecified chronicity  Difficulty in walking, not elsewhere classified  Stiffness of right knee, not elsewhere classified  Muscle weakness (generalized)  Rationale for Evaluation and Treatment: Rehabilitation  ONSET DATE: s/p 11/13/23  SUBJECTIVE:   SUBJECTIVE STATEMENT: Pt reports she has been  working hard on her ROM.  States it feels stiff this morning.   Returns to [place] 6/25 and returns to work on 6/16.   EVAL:S/p right TKA 10/03/23 per Dr. Abigail Abler; arrives with RW and waterproof bandage in place; TED hose  PERTINENT HISTORY: Here previous for knee pain PAIN:  Are you having pain? Yes: NPRS scale: 3-7/10 Pain location: right knee Pain description: throbbing, tight Aggravating factors: bending, movemenet Relieving factors: ice rest, medication  PRECAUTIONS: None  RED FLAGS: None   WEIGHT BEARING RESTRICTIONS: No  FALLS:  Has patient fallen in last 6 months? Yes. Number of falls 1 prior to surgery   OCCUPATION: retired but works some at [employer]  PLOF: Independent  PATIENT GOALS: walk normally without walker  NEXT MD VISIT: 12/11/23  OBJECTIVE:  Note: Objective measures were completed at Evaluation unless otherwise noted.  DIAGNOSTIC FINDINGS: none  PATIENT SURVEYS:  LEFS next visit 10/10/23 LEFS 8/80 10%  11/07/23 LEFS 26/80 32.5%  COGNITION: Overall cognitive status: Within functional limits for tasks assessed     SENSATION: WFL  EDEMA:  Normal for this time s/p   PALPATION: General soreness  LOWER EXTREMITY ROM: very guarded  Active ROM Right eval Left eval Right 10/10/23 Right 10/15/23 Right  10/17/23 Right  10/22/23 Right 11/01/23 Right 11/05/23: Right 11/14/23  Hip flexion  Hip extension           Hip abduction           Hip adduction           Hip internal rotation           Hip external rotation           Knee flexion 32  65 AAROM 74 AAROM 75 AAROM 72 90 90 AROM 90 degrees, AROM 98 degrees  Knee extension -15   -8 -10 -8 -5 -5   Ankle dorsiflexion Decreased versus left          Ankle plantarflexion           Ankle inversion           Ankle eversion            (Blank rows = not tested)  LOWER EXTREMITY MMT:  MMT Right eval Right 11/07/23  Hip flexion 2 4  Hip extension    Hip abduction    Hip adduction     Hip internal rotation    Hip external rotation    Knee flexion    Knee extension 2 (poor quad set) 3-  Ankle dorsiflexion    Ankle plantarflexion    Ankle inversion    Ankle eversion     (Blank rows = not tested)  FUNCTIONAL TESTS:  Evaluation 5 times sit to stand: 34.98 sec using hands to assist and reliant on left leg 2 minute walk test: 32 ft with RW 11/07/23  5X STS no UE (leaning to Lt) 12.38" (was 34.98 using UE)  125' with RW (was 32 ft) with SPC 40' in 2 minutes   GAIT: Distance walked: 32 ft Assistive device utilized: Environmental consultant - 2 wheeled Level of assistance: SBA Comments: slow pace; step to gait                                                                                                                                TREATMENT DATE: 11/21/23: Bike seat 7 full revolutions backward then forward 5 minutes Manual Soft tissue massage, myofascial Rt knee 10 minutes  Supine with feet on theraball with AAROM for flexion  AROM 7-108 degrees  Heel prop with heel on towel with 5#, x 3'  Hamstring stretch 3x 30" Prone: Contract/relax 4 sets 10" holds Quad stretch 3x 30" Seated:  Knee flexion 5x 10"   11/20/23 Soft tissue massage, myofascial Rt knee 10 minutes  Rt knee AROM -5 to 95, AAROM 100 degrees Bike seat 7 full revolutions backward then forward 5 minutes Standing:  Rt knee flexion end range with toe on 4" step 10X  Forward lunges onto 4" step no UE 2X10  Knee flexion stretch on 12" step 10X10"  Hip abduction bil 10X Gait with SPC around clinic working on even stride, reduced antalgia 4" stair negotiation reciprocally with 1 HR 3RT   11/14/23 Bike seat 7  rocking 5 minutes Standing:  foot on 2nd step (14") 10X10" flexion stretch  Rt hamstring curl with end range toe on 4" step 20X Supine:  manual for scar tissue, edema (MFR and soft tissue) Gait with SPC working on larger steps, even stride Stair negotiation 7" with 1 HR step to up/down (given  instuction sheet) Prone contract relax, hamstring curls AROM supine 90 degrees, AAROM 98 degrees Seated leg swing (for home)    11/12/23: Pt able to donn thigh high compression garments I Supine: Heel slides Quad sets 10x AROM 5-90 degrees Prone: Prone knee hang x 2 min Quad stretch with rope 3x 30" Hamstring curls 10x Bike rocking x 5'  11/07/23 Progress note completed with SPC 40 feet (walked total of 100 foot) with RW 125 feet (was 32 feet with RW) 5X STS no UE (leaning to Lt) 12.38" (was 34.98 using UE) LEFS 26/80 32.5% was 8/80 10%) MMT (see above) AROM -5 to 90 degrees (was -15 to 32 at eval) Goal review, gait training with [facility] Desensitization education/compression education Supine SAQ with 1/2 roll 10X5"  11/05/23 STM to right knee and manual ROM to improve right knee mobility x 15' Supine: - Heel slides with rope AAROM 10x - Quad sets 10x AROM 5-94 degres - Hamstring stretch with rope 3x 30" Standing: - TKE GTB 5x 5" - TKE pushing ball into wall 10x 5" Gait training initially in // bars with 1 hand and cueing for heel to toe mechanics, toe push off, equal stance and stride length Gait training SPC 3 point sequence x 41ft min guard   11/01/23 STM to right knee and manual ROM to improve right knee mobility x 15' AROM right knee -5 to 90 today seated Standing: Heel and toe raises 2 x 10 8" step knee drives for flexion x 2' TKE blue theraband x 20 8" box hamstring stretch 5 x 20" Slant board stretch 5 x 20" Updated HEP     PATIENT EDUCATION:  Education details: Patient educated on exam findings, POC, scope of PT, HEP, and what to expect next visit. Person educated: Patient Education method: Explanation, Demonstration, and Handouts Education comprehension: verbalized understanding, returned demonstration, verbal cues required, and tactile cues required  HOME EXERCISE PROGRAM: 11/12/23: - Prone Knee Extension Hang  - 2 x daily - 7 x weekly - 1  sets - 1 reps - 2'+ hold  11/05/23: - desensitization techniques on incision - TKE with ball against wall.  11/01/23 - Standing Hamstring Stretch with Step  - 2 x daily - 7 x weekly - 1 sets - 5 reps - 20" hold - knee bends on the step using rails to hold onto  - 2 x daily - 7 x weekly - 3 sets - 10 reps - 5 sec hold  Access Code: [email or web address] URL: [email or web address] Date: 10/07/2023 Prepared by: AP - Rehab  Exercises - Supine Ankle Pumps  - 2-3 x daily - 7 x weekly - 2 sets - 10-20 reps - Supine Heel Slide  - 2-3 x daily - 7 x weekly - 3 sets - 10-20 reps - 5 hold - Seated Knee Flexion  - 2-3 x daily - 7 x weekly - 3 sets - 10-20 reps - 10 hold - Seated Long Arc Quad  - 2-3 x daily - 7 x weekly - 3 sets - 10-20 reps - Supine Quad Set  - 2-3 x daily - 7 x weekly - 3 sets - 10 reps - Supine  Heel Slide with Strap  - 2-3 x daily - 7 x weekly - 3 sets - 10 reps - Seated Knee Extension AAROM  - 2-3 x daily - 7 x weekly - 3 sets - 10 reps - Supine Knee Extension Stretch on Towel Roll  - 2-3 x daily - 7 x weekly - 3 sets - 10 reps  ASSESSMENT:  CLINICAL IMPRESSION: Session focus with knee mobility, pt able to make full revolutions backwards then forward with cueing for hip rise.  Added contract/ relax for flexion with good improvements with AROM 7-107 degrees (was 100 degrees last session.)  Added quad stretch and hamstring stretches to HEP.  Eval: Patient is a 68 y.o. female who was seen today for physical therapy evaluation and treatment for s/p Right TKA.  Patient demonstrates muscle weakness, reduced ROM, and fascial restrictions which are likely contributing to symptoms of pain and are negatively impacting patient ability to perform ADLs and functional mobility tasks. Patient will benefit from skilled physical therapy services to address these deficits to reduce pain and improve level of function with ADLs and functional mobility tasks.   OBJECTIVE IMPAIRMENTS: Abnormal  gait, decreased activity tolerance, decreased mobility, difficulty walking, decreased ROM, decreased strength, increased fascial restrictions, and pain.   ACTIVITY LIMITATIONS: carrying, lifting, bending, sitting, standing, squatting, sleeping, stairs, transfers, bed mobility, locomotion level, and caring for others  PARTICIPATION LIMITATIONS: meal prep, cleaning, laundry, driving, shopping, community activity, and yard work  [facility] POTENTIAL: Good  CLINICAL DECISION MAKING: Evolving/moderate complexity  EVALUATION COMPLEXITY: Moderate   GOALS: Goals reviewed with patient? Yes  SHORT TERM GOALS: Target date: 10/28/23 patient will be independent with initial HEP Baseline: Goal status: MET  2.  Patient will report 50% improvement overall Baseline:  Goal status: in progress   3.  Patient will increase right knee flexion to 90 to prepare for step navigation Baseline: 32 Goal status: MET    LONG TERM GOALS: Target date: 11/22/23  Patient will be independent in self management strategies to improve quality of life and functional outcomes.  Baseline:  Goal status: in progress   2.  Patient will report 75% improvement overall  Baseline:  Goal status: in progress   3.  Patient will improve LEFS score by 10  points to demonstrate improved perceived function   Baseline: 8/80 Goal status: MET  4.   Patient will increase right leg MMT's to 5/5 to allow navigation of steps without gait deviation or loss of balance  Baseline: see above Goal status: in progress   5.  Patient will increase knee mobility to -2 to 120 to promote normal navigation of steps; step over step pattern  Baseline: see above Goal status: in progress   PLAN:  PT FREQUENCY: 2x/week  PT DURATION: 6 weeks  PLANNED INTERVENTIONS: 97164- PT Re-evaluation, 97110-Therapeutic exercises, 97530- Therapeutic activity, 97112- Neuromuscular re-education, 97535- Self Care, 95284- Manual therapy, [phone]- Gait  training, [phone]- Orthotic Fit/training, [phone]- Canalith repositioning, [id number]- Aquatic Therapy, [phone]- Splinting, Patient/Family education, Balance training, Stair training, Taping, Dry Needling, Joint mobilization, Joint manipulation, Spinal manipulation, Spinal mobilization, Scar mobilization, and DME instructions.   PLAN FOR NEXT SESSION:  progress knee mobility and then strength; gait training with SPC.  Begin static balance challenges including single leg stance/tandem stance.   Minor Amble, LPTA/CLT; Johnye Napoleon [phone]    2:35 PM, 11/21/23

## 2023-11-27 ENCOUNTER — Ambulatory Visit (HOSPITAL_COMMUNITY): Admitting: Physical Therapy

## 2023-11-27 DIAGNOSIS — R262 Difficulty in walking, not elsewhere classified: Secondary | ICD-10-CM

## 2023-11-27 DIAGNOSIS — M25661 Stiffness of right knee, not elsewhere classified: Secondary | ICD-10-CM

## 2023-11-27 DIAGNOSIS — M6281 Muscle weakness (generalized): Secondary | ICD-10-CM

## 2023-11-27 DIAGNOSIS — M25561 Pain in right knee: Secondary | ICD-10-CM | POA: Diagnosis not present

## 2023-11-27 NOTE — Therapy (Signed)
 OUTPATIENT PHYSICAL THERAPY LOWER EXTREMITY TREATMENT    Patient Name: DELISA FINCK MRN: [id number] DOB:21-Feb-1956, 68 y.o., female Today's Date: 11/27/2023  END OF SESSION:  PT End of Session - 11/27/23 1528     Visit Number 15    Number of Visits 24    Date for PT Re-Evaluation 12/26/23    Authorization Type [employer]    Authorization Time Period no auth; no limit    Progress Note Due on Visit 20   PN complete visit #10   PT Start Time 1105    PT Stop Time 1145    PT Time Calculation (min) 40 min    Activity Tolerance Patient tolerated treatment well    Behavior During Therapy WFL for tasks assessed/performed                   Past Medical History:  Diagnosis Date   Asthma    Hypertension    PONV (postoperative nausea and vomiting)    Past Surgical History:  Procedure Laterality Date   ABDOMINAL HYSTERECTOMY     BREAST EXCISIONAL BIOPSY     BREAST LUMPECTOMY WITH RADIOACTIVE SEED LOCALIZATION Left 10/23/2019   Procedure: LEFT BREAST LUMPECTOMY WITH RADIOACTIVE SEED LOCALIZATION;  Surgeon: Caralyn Chandler, MD;  Location: [facility];  Service: General;  Laterality: Left;   CESAREAN SECTION     DG GALL BLADDER     FOOT SURGERY     Patient Active Problem List   Diagnosis Date Noted   At high risk for breast cancer 03/03/2020   Atypical ductal hyperplasia of left breast 11/27/2019   Essential hypertension 04/05/2017   Chest pain 03/06/2017   Family history of heart disease 03/06/2017    PCP: Jolynn Needy, PA  REFERRING PROVIDER: Saundra Curl, MD  REFERRING DIAG: Rt TKR on 10/03/2023  THERAPY DIAG:  Right knee pain, unspecified chronicity  Difficulty in walking, not elsewhere classified  Muscle weakness (generalized)  Stiffness of right knee, not elsewhere classified  Rationale for Evaluation and Treatment: Rehabilitation  ONSET DATE: s/p 11/13/23  SUBJECTIVE:   SUBJECTIVE STATEMENT: Pt reports she went  back to work on Monday and is working 3-4, 5 hour days.  Having to get up and move around every 30 minutes to reduce stiffness.  No pain just stiffness. Pt also has returned to driving.   Returns to [place] 6/25 and returned  to work on 6/9.   EVAL:S/p right TKA 10/03/23 per Dr. Abigail Abler; arrives with RW and waterproof bandage in place; TED hose  PERTINENT HISTORY: Here previous for knee pain PAIN:  Are you having pain? No  PRECAUTIONS: None  RED FLAGS: None   WEIGHT BEARING RESTRICTIONS: No  FALLS:  Has patient fallen in last 6 months? Yes. Number of falls 1 prior to surgery   OCCUPATION: retired but works some at [employer]  PLOF: Independent  PATIENT GOALS: walk normally without walker  NEXT MD VISIT: 12/11/23  OBJECTIVE:  Note: Objective measures were completed at Evaluation unless otherwise noted.  DIAGNOSTIC FINDINGS: none  PATIENT SURVEYS:  LEFS next visit 10/10/23 LEFS 8/80 10%  11/07/23 LEFS 26/80 32.5%  COGNITION: Overall cognitive status: Within functional limits for tasks assessed     SENSATION: WFL  EDEMA:  Normal for this time s/p   PALPATION: General soreness  LOWER EXTREMITY ROM: very guarded  Active ROM Right eval Left eval Right 10/10/23 Right 10/15/23 Right  10/17/23 Right  10/22/23 Right 11/01/23 Right 11/05/23: Right  11/14/23 Right 11/27/23  Hip flexion            Hip extension            Hip abduction            Hip adduction            Hip internal rotation            Hip external rotation            Knee flexion 32  65 AAROM 74 AAROM 75 AAROM 72 90 90 AROM 90 degrees, AROM 98 degrees 110  Knee extension -15   -8 -10 -8 -5 -5  -3  Ankle dorsiflexion Decreased versus left           Ankle plantarflexion            Ankle inversion            Ankle eversion             (Blank rows = not tested)  LOWER EXTREMITY MMT:  MMT Right eval Right 11/07/23  Hip flexion 2 4  Hip extension    Hip abduction    Hip adduction    Hip  internal rotation    Hip external rotation    Knee flexion    Knee extension 2 (poor quad set) 3-  Ankle dorsiflexion    Ankle plantarflexion    Ankle inversion    Ankle eversion     (Blank rows = not tested)  FUNCTIONAL TESTS:  Evaluation 5 times sit to stand: 34.98 sec using hands to assist and reliant on left leg 2 minute walk test: 32 ft with RW 11/07/23  5X STS no UE (leaning to Lt) 12.38 (was 34.98 using UE)  125' with RW (was 32 ft) with SPC 40' in 2 minutes   GAIT: Distance walked: 32 ft Assistive device utilized: Walker - 2 wheeled Level of assistance: SBA Comments: slow pace; step to gait                                                                                                                                TREATMENT DATE: 11/27/23: Bike seat 7 full revolutions  5 minutes Standing:   Rt knee flexion stretch end range with toe on 4 step 10X Hip abduction 2X10 each LE Rt knee flexion onto 12 step 10X10  Hamstring stretch with overpressure onto 12 step 3X20  SLS Rt 10 Supine: manual Rt knee  AROM -3 to 110 degrees  Heelslides 10X  Quad sets 10X5 Prone knee flexion (for HEP)   11/21/23: Bike seat 7 full revolutions backward then forward 5 minutes Manual Soft tissue massage, myofascial Rt knee 10 minutes  Supine with feet on theraball with AAROM for flexion  AROM 7-108 degrees  Heel prop with heel on towel with 5#, x 3'  Hamstring stretch 3x 30 Prone: Contract/relax 4 sets 10 holds  Quad stretch 3x 30 Seated:  Knee flexion 5x 10   11/20/23 Soft tissue massage, myofascial Rt knee 10 minutes  Rt knee AROM -5 to 95, AAROM 100 degrees Bike seat 7 full revolutions backward then forward 5 minutes Standing:  Rt knee flexion end range with toe on 4 step 10X  Forward lunges onto 4 step no UE 2X10  Knee flexion stretch on 12 step 10X10  Hip abduction bil 10X Gait with SPC around clinic working on even stride, reduced antalgia 4 stair  negotiation reciprocally with 1 HR 3RT   11/14/23 Bike seat 7 rocking 5 minutes Standing:  foot on 2nd step (14) 10X10 flexion stretch  Rt hamstring curl with end range toe on 4 step 20X Supine:  manual for scar tissue, edema (MFR and soft tissue) Gait with SPC working on larger steps, even stride Stair negotiation 7 with 1 HR step to up/down (given instuction sheet) Prone contract relax, hamstring curls AROM supine 90 degrees, AAROM 98 degrees Seated leg swing (for home)      PATIENT EDUCATION:  Education details: Patient educated on exam findings, POC, scope of PT, HEP, and what to expect next visit. Person educated: Patient Education method: Explanation, Demonstration, and Handouts Education comprehension: verbalized understanding, returned demonstration, verbal cues required, and tactile cues required  HOME EXERCISE PROGRAM: 11/12/23: - Prone Knee Extension Hang  - 2 x daily - 7 x weekly - 1 sets - 1 reps - 2'+ hold  11/05/23: - desensitization techniques on incision - TKE with ball against wall.  11/01/23 - Standing Hamstring Stretch with Step  - 2 x daily - 7 x weekly - 1 sets - 5 reps - 20 hold - knee bends on the step using rails to hold onto  - 2 x daily - 7 x weekly - 3 sets - 10 reps - 5 sec hold  Access Code: [email or web address] URL: [email or web address] Date: 10/07/2023 Prepared by: AP - Rehab  Exercises - Supine Ankle Pumps  - 2-3 x daily - 7 x weekly - 2 sets - 10-20 reps - Supine Heel Slide  - 2-3 x daily - 7 x weekly - 3 sets - 10-20 reps - 5 hold - Seated Knee Flexion  - 2-3 x daily - 7 x weekly - 3 sets - 10-20 reps - 10 hold - Seated Long Arc Quad  - 2-3 x daily - 7 x weekly - 3 sets - 10-20 reps - Supine Quad Set  - 2-3 x daily - 7 x weekly - 3 sets - 10 reps - Supine Heel Slide with Strap  - 2-3 x daily - 7 x weekly - 3 sets - 10 reps - Seated Knee Extension AAROM  - 2-3 x daily - 7 x weekly - 3 sets - 10 reps - Supine Knee Extension Stretch  on Towel Roll  - 2-3 x daily - 7 x weekly - 3 sets - 10 reps  ASSESSMENT:  CLINICAL IMPRESSION: Pt making great gains with functional mobility and gait.  Manual continued to medial and lateral aspect of knee.  No tightness along scar today.  Increased AROM following to 110 degrees.  Walking some in home without AD at this point with minimal deviation.  Worked on bending knee more when advancing Rt LE during gait.  Began single leg stance with inability to maintain greater than 3 seconds without UE assist. No pain or issues today with therapy.  Extension today -3 which is also an improvement.   Eval:  Patient is a 68 y.o. female who was seen today for physical therapy evaluation and treatment for s/p Right TKA.  Patient demonstrates muscle weakness, reduced ROM, and fascial restrictions which are likely contributing to symptoms of pain and are negatively impacting patient ability to perform ADLs and functional mobility tasks. Patient will benefit from skilled physical therapy services to address these deficits to reduce pain and improve level of function with ADLs and functional mobility tasks.   OBJECTIVE IMPAIRMENTS: Abnormal gait, decreased activity tolerance, decreased mobility, difficulty walking, decreased ROM, decreased strength, increased fascial restrictions, and pain.   ACTIVITY LIMITATIONS: carrying, lifting, bending, sitting, standing, squatting, sleeping, stairs, transfers, bed mobility, locomotion level, and caring for others  PARTICIPATION LIMITATIONS: meal prep, cleaning, laundry, driving, shopping, community activity, and yard work  [facility] POTENTIAL: Good  CLINICAL DECISION MAKING: Evolving/moderate complexity  EVALUATION COMPLEXITY: Moderate   GOALS: Goals reviewed with patient? Yes  SHORT TERM GOALS: Target date: 10/28/23 patient will be independent with initial HEP Baseline: Goal status: MET  2.  Patient will report 50% improvement overall Baseline:  Goal status: in  progress   3.  Patient will increase right knee flexion to 90 to prepare for step navigation Baseline: 32 Goal status: MET    LONG TERM GOALS: Target date: 11/22/23  Patient will be independent in self management strategies to improve quality of life and functional outcomes.  Baseline:  Goal status: in progress   2.  Patient will report 75% improvement overall  Baseline:  Goal status: in progress   3.  Patient will improve LEFS score by 10  points to demonstrate improved perceived function   Baseline: 8/80 Goal status: MET  4.   Patient will increase right leg MMT's to 5/5 to allow navigation of steps without gait deviation or loss of balance  Baseline: see above Goal status: in progress   5.  Patient will increase knee mobility to -2 to 120 to promote normal navigation of steps; step over step pattern  Baseline: see above Goal status: in progress   PLAN:  PT FREQUENCY: 2x/week  PT DURATION: 6 weeks  PLANNED INTERVENTIONS: 97164- PT Re-evaluation, 97110-Therapeutic exercises, 97530- Therapeutic activity, 97112- Neuromuscular re-education, 97535- Self Care, 45409- Manual therapy, [phone]- Gait training, [phone]- Orthotic Fit/training, [phone]- Canalith repositioning, [id number]- Aquatic Therapy, [phone]- Splinting, Patient/Family education, Balance training, Stair training, Taping, Dry Needling, Joint mobilization, Joint manipulation, Spinal manipulation, Spinal mobilization, Scar mobilization, and DME instructions.   PLAN FOR NEXT SESSION:  progress knee mobility and then strength; gait training with SPC. Progress static balance challenges including tandem stance and vectors.   Lorenso Romance, PTA/CLT [facility] Ph: [phone]   3:29 PM, 11/27/23

## 2023-11-29 ENCOUNTER — Ambulatory Visit (HOSPITAL_COMMUNITY)

## 2023-11-29 ENCOUNTER — Encounter (HOSPITAL_COMMUNITY): Payer: Self-pay

## 2023-11-29 DIAGNOSIS — M25561 Pain in right knee: Secondary | ICD-10-CM

## 2023-11-29 DIAGNOSIS — R262 Difficulty in walking, not elsewhere classified: Secondary | ICD-10-CM

## 2023-11-29 DIAGNOSIS — M6281 Muscle weakness (generalized): Secondary | ICD-10-CM

## 2023-11-29 DIAGNOSIS — M25661 Stiffness of right knee, not elsewhere classified: Secondary | ICD-10-CM

## 2023-11-29 NOTE — Therapy (Signed)
 OUTPATIENT PHYSICAL THERAPY LOWER EXTREMITY TREATMENT    Patient Name: CARLOTA PHILLEY MRN: [id number] DOB:05/21/1956, 68 y.o., female Today's Date: 11/29/2023  END OF SESSION:  PT End of Session - 11/29/23 1145     Visit Number 16    Number of Visits 24    Date for PT Re-Evaluation 12/26/23    Authorization Type [employer]    Authorization Time Period no auth; no limit    Progress Note Due on Visit 20   PN complete visit 10#   PT Start Time 1146    PT Stop Time 1230    PT Time Calculation (min) 44 min    Activity Tolerance Patient tolerated treatment well    Behavior During Therapy WFL for tasks assessed/performed                Past Medical History:  Diagnosis Date   Asthma    Hypertension    PONV (postoperative nausea and vomiting)    Past Surgical History:  Procedure Laterality Date   ABDOMINAL HYSTERECTOMY     BREAST EXCISIONAL BIOPSY     BREAST LUMPECTOMY WITH RADIOACTIVE SEED LOCALIZATION Left 10/23/2019   Procedure: LEFT BREAST LUMPECTOMY WITH RADIOACTIVE SEED LOCALIZATION;  Surgeon: Caralyn Chandler, MD;  Location: [facility];  Service: General;  Laterality: Left;   CESAREAN SECTION     DG GALL BLADDER     FOOT SURGERY     Patient Active Problem List   Diagnosis Date Noted   At high risk for breast cancer 03/03/2020   Atypical ductal hyperplasia of left breast 11/27/2019   Essential hypertension 04/05/2017   Chest pain 03/06/2017   Family history of heart disease 03/06/2017    PCP: Jolynn Needy, PA  REFERRING PROVIDER: Saundra Curl, MD  REFERRING DIAG: Rt TKR on 10/03/2023  THERAPY DIAG:  Right knee pain, unspecified chronicity  Difficulty in walking, not elsewhere classified  Muscle weakness (generalized)  Stiffness of right knee, not elsewhere classified  Rationale for Evaluation and Treatment: Rehabilitation  ONSET DATE: s/p 11/13/23  SUBJECTIVE:   SUBJECTIVE STATEMENT: Knee feels good today.  Has  RTW on 11/25/23,  no issues with the knee, reports she is tired at the end of day.  Has began driving.  EVAL:S/p right TKA 10/03/23 per Dr. Abigail Abler; arrives with RW and waterproof bandage in place; TED hose  PERTINENT HISTORY: Here previous for knee pain PAIN:  Are you having pain? No  PRECAUTIONS: None  RED FLAGS: None   WEIGHT BEARING RESTRICTIONS: No  FALLS:  Has patient fallen in last 6 months? Yes. Number of falls 1 prior to surgery   OCCUPATION: retired but works some at [employer]  PLOF: Independent  PATIENT GOALS: walk normally without walker  NEXT MD VISIT: 12/11/23  OBJECTIVE:  Note: Objective measures were completed at Evaluation unless otherwise noted.  DIAGNOSTIC FINDINGS: none  PATIENT SURVEYS:  LEFS next visit 10/10/23 LEFS 8/80 10%  11/07/23 LEFS 26/80 32.5%  COGNITION: Overall cognitive status: Within functional limits for tasks assessed     SENSATION: WFL  EDEMA:  Normal for this time s/p   PALPATION: General soreness  LOWER EXTREMITY ROM: very guarded  Active ROM Right eval Left eval Right 10/10/23 Right 10/15/23 Right  10/17/23 Right  10/22/23 Right 11/01/23 Right 11/05/23: Right 11/14/23 Right 11/27/23 Right 11/29/23  Hip flexion             Hip extension  Hip abduction             Hip adduction             Hip internal rotation             Hip external rotation             Knee flexion 32  65 AAROM 74 AAROM 75 AAROM 72 90 90 AROM 90 degrees, AROM 98 degrees 110 113  Knee extension -15   -8 -10 -8 -5 -5  -3 5 from neutral  Ankle dorsiflexion Decreased versus left            Ankle plantarflexion             Ankle inversion             Ankle eversion              (Blank rows = not tested)  LOWER EXTREMITY MMT:  MMT Right eval Right 11/07/23  Hip flexion 2 4  Hip extension    Hip abduction    Hip adduction    Hip internal rotation    Hip external rotation    Knee flexion    Knee extension 2 (poor quad set)  3-  Ankle dorsiflexion    Ankle plantarflexion    Ankle inversion    Ankle eversion     (Blank rows = not tested)  FUNCTIONAL TESTS:  Evaluation 5 times sit to stand: 34.98 sec using hands to assist and reliant on left leg 2 minute walk test: 32 ft with RW 11/07/23  5X STS no UE (leaning to Lt) 12.38 (was 34.98 using UE)  125' with RW (was 32 ft) with SPC 40' in 2 minutes 11/29/23: 362 ft no AD   GAIT: Distance walked: 32 ft Assistive device utilized: Environmental consultant - 2 wheeled Level of assistance: SBA Comments: slow pace; step to gait                                                                                                                                TREATMENT DATE: 11/29/23:   : 334ft no AD Bike seat 8 -->7 full revolutions  5 minutes Standing:  Knee drive for flexion 5x 10 (16XW step)  Hamstring stretch on 12in step 3x 30   SLS Lt 24 Rt 29  Vector stance 3x 5  Knee flexion on 4in 10x 5 Seated:  Stool scoot forward/backward 1RT Supine:   Heel slides AROM 5-113 degrees Manual with LE elevated on wedge to medial and lateral aspect of knee    Media Information   Document Information  Photos  Rt knee with bumps near incision for last 3 weeks  11/29/2023 12:22  Attached To:  Outpatient Rehab on 11/29/23 with Terrilyn Fick, PTA  Source Information  Terrilyn Fick, PTA  [facility]    11/27/23: Bike seat 7 full revolutions  5 minutes  Standing:   Rt knee flexion stretch end range with toe on 4 step 10X Hip abduction 2X10 each LE Rt knee flexion onto 12 step 10X10  Hamstring stretch with overpressure onto 12 step 3X20  SLS Rt 10 Supine: manual Rt knee  AROM -3 to 110 degrees  Heelslides 10X  Quad sets 10X5 Prone knee flexion (for HEP)   11/21/23: Bike seat 7 full revolutions backward then forward 5 minutes Manual Soft tissue massage, myofascial Rt knee 10 minutes  Supine with feet on theraball with AAROM for  flexion  AROM 7-108 degrees  Heel prop with heel on towel with 5#, x 3'  Hamstring stretch 3x 30 Prone: Contract/relax 4 sets 10 holds Quad stretch 3x 30 Seated:  Knee flexion 5x 10   11/20/23 Soft tissue massage, myofascial Rt knee 10 minutes  Rt knee AROM -5 to 95, AAROM 100 degrees Bike seat 7 full revolutions backward then forward 5 minutes Standing:  Rt knee flexion end range with toe on 4 step 10X  Forward lunges onto 4 step no UE 2X10  Knee flexion stretch on 12 step 10X10  Hip abduction bil 10X Gait with SPC around clinic working on even stride, reduced antalgia 4 stair negotiation reciprocally with 1 HR 3RT   11/14/23 Bike seat 7 rocking 5 minutes Standing:  foot on 2nd step (14) 10X10 flexion stretch  Rt hamstring curl with end range toe on 4 step 20X Supine:  manual for scar tissue, edema (MFR and soft tissue) Gait with SPC working on larger steps, even stride Stair negotiation 7 with 1 HR step to up/down (given instuction sheet) Prone contract relax, hamstring curls AROM supine 90 degrees, AAROM 98 degrees Seated leg swing (for home)      PATIENT EDUCATION:  Education details: Patient educated on exam findings, POC, scope of PT, HEP, and what to expect next visit. Person educated: Patient Education method: Explanation, Demonstration, and Handouts Education comprehension: verbalized understanding, returned demonstration, verbal cues required, and tactile cues required  HOME EXERCISE PROGRAM: 11/12/23: - Prone Knee Extension Hang  - 2 x daily - 7 x weekly - 1 sets - 1 reps - 2'+ hold  11/05/23: - desensitization techniques on incision - TKE with ball against wall.  11/01/23 - Standing Hamstring Stretch with Step  - 2 x daily - 7 x weekly - 1 sets - 5 reps - 20 hold - knee bends on the step using rails to hold onto  - 2 x daily - 7 x weekly - 3 sets - 10 reps - 5 sec hold  Access Code: [email or web address] URL: [email or web address] Date:  10/07/2023 Prepared by: [facility]  Exercises - Supine Ankle Pumps  - 2-3 x daily - 7 x weekly - 2 sets - 10-20 reps - Supine Heel Slide  - 2-3 x daily - 7 x weekly - 3 sets - 10-20 reps - 5 hold - Seated Knee Flexion  - 2-3 x daily - 7 x weekly - 3 sets - 10-20 reps - 10 hold - Seated Long Arc Quad  - 2-3 x daily - 7 x weekly - 3 sets - 10-20 reps - Supine Quad Set  - 2-3 x daily - 7 x weekly - 3 sets - 10 reps - Supine Heel Slide with Strap  - 2-3 x daily - 7 x weekly - 3 sets - 10 reps - Seated Knee Extension AAROM  - 2-3 x daily - 7 x weekly - 3 sets - 10 reps -  Supine Knee Extension Stretch on Towel Roll  - 2-3 x daily - 7 x weekly - 3 sets - 10 reps  ASSESSMENT:  CLINICAL IMPRESSION: Pt progressing well toward POC.  Session focus iwht knee mobility and functional strengthening.  Began session with with no AD, some antalgic gait noted with decreased heel strike and cueing for equal stance phase, no LOB noted.  Added hamstring strengthening exercises this session with some cueing for form and mechanics to reduce compensation.  AROM 5-113 degrees today.  No reports of pain through session, just tightness in knee.  Manual complete to address tightness with noted red bumps around incision, picture taken in media.    Eval: Patient is a 68 y.o. female who was seen today for physical therapy evaluation and treatment for s/p Right TKA.  Patient demonstrates muscle weakness, reduced ROM, and fascial restrictions which are likely contributing to symptoms of pain and are negatively impacting patient ability to perform ADLs and functional mobility tasks. Patient will benefit from skilled physical therapy services to address these deficits to reduce pain and improve level of function with ADLs and functional mobility tasks.   OBJECTIVE IMPAIRMENTS: Abnormal gait, decreased activity tolerance, decreased mobility, difficulty walking, decreased ROM, decreased strength, increased fascial restrictions,  and pain.   ACTIVITY LIMITATIONS: carrying, lifting, bending, sitting, standing, squatting, sleeping, stairs, transfers, bed mobility, locomotion level, and caring for others  PARTICIPATION LIMITATIONS: meal prep, cleaning, laundry, driving, shopping, community activity, and yard work  [facility] POTENTIAL: Good  CLINICAL DECISION MAKING: Evolving/moderate complexity  EVALUATION COMPLEXITY: Moderate   GOALS: Goals reviewed with patient? Yes  SHORT TERM GOALS: Target date: 10/28/23 patient will be independent with initial HEP Baseline: Goal status: MET  2.  Patient will report 50% improvement overall Baseline:  Goal status: in progress   3.  Patient will increase right knee flexion to 90 to prepare for step navigation Baseline: 32 Goal status: MET    LONG TERM GOALS: Target date: 11/22/23  Patient will be independent in self management strategies to improve quality of life and functional outcomes.  Baseline:  Goal status: in progress   2.  Patient will report 75% improvement overall  Baseline:  Goal status: in progress   3.  Patient will improve LEFS score by 10  points to demonstrate improved perceived function   Baseline: 8/80 Goal status: MET  4.   Patient will increase right leg MMT's to 5/5 to allow navigation of steps without gait deviation or loss of balance  Baseline: see above Goal status: in progress   5.  Patient will increase knee mobility to -2 to 120 to promote normal navigation of steps; step over step pattern  Baseline: see above Goal status: in progress   PLAN:  PT FREQUENCY: 2x/week  PT DURATION: 6 weeks  PLANNED INTERVENTIONS: 97164- PT Re-evaluation, 97110-Therapeutic exercises, 97530- Therapeutic activity, 97112- Neuromuscular re-education, 97535- Self Care, 16109- Manual therapy, [phone]- Gait training, [phone]- Orthotic Fit/training, [phone]- Canalith repositioning, [id number]- Aquatic Therapy, [phone]- Splinting, Patient/Family education, Balance  training, Stair training, Taping, Dry Needling, Joint mobilization, Joint manipulation, Spinal manipulation, Spinal mobilization, Scar mobilization, and DME instructions.   PLAN FOR NEXT SESSION:  progress knee mobility and then strength; gait training without AD. Progress static balance challenges including tandem stance and vectors.   Minor Amble, LPTA/CLT; CBIS [phone]    3:05 PM, 11/29/23

## 2023-12-02 ENCOUNTER — Ambulatory Visit (HOSPITAL_COMMUNITY)

## 2023-12-02 DIAGNOSIS — R262 Difficulty in walking, not elsewhere classified: Secondary | ICD-10-CM

## 2023-12-02 DIAGNOSIS — M25561 Pain in right knee: Secondary | ICD-10-CM | POA: Diagnosis not present

## 2023-12-02 DIAGNOSIS — M6281 Muscle weakness (generalized): Secondary | ICD-10-CM

## 2023-12-02 NOTE — Therapy (Signed)
 OUTPATIENT PHYSICAL THERAPY LOWER EXTREMITY TREATMENT    Patient Name: Amanda Roberts MRN: [id number] DOB:September 08, 1955, 68 y.o., female Today's Date: 12/02/2023  END OF SESSION:  PT End of Session - 12/02/23 1507     Visit Number 17    Number of Visits 24    Date for PT Re-Evaluation 12/26/23    Authorization Type [employer]    Authorization Time Period no auth; no limit    Progress Note Due on Visit 20   PN complete visit 10#   PT Start Time 1431    PT Stop Time 1510    PT Time Calculation (min) 39 min    Activity Tolerance Patient tolerated treatment well    Behavior During Therapy WFL for tasks assessed/performed                 Past Medical History:  Diagnosis Date   Asthma    Hypertension    PONV (postoperative nausea and vomiting)    Past Surgical History:  Procedure Laterality Date   ABDOMINAL HYSTERECTOMY     BREAST EXCISIONAL BIOPSY     BREAST LUMPECTOMY WITH RADIOACTIVE SEED LOCALIZATION Left 10/23/2019   Procedure: LEFT BREAST LUMPECTOMY WITH RADIOACTIVE SEED LOCALIZATION;  Surgeon: Caralyn Chandler, MD;  Location: [facility];  Service: General;  Laterality: Left;   CESAREAN SECTION     DG GALL BLADDER     FOOT SURGERY     Patient Active Problem List   Diagnosis Date Noted   At high risk for breast cancer 03/03/2020   Atypical ductal hyperplasia of left breast 11/27/2019   Essential hypertension 04/05/2017   Chest pain 03/06/2017   Family history of heart disease 03/06/2017    PCP: Jolynn Needy, PA  REFERRING PROVIDER: Saundra Curl, MD  REFERRING DIAG: Rt TKR on 10/03/2023  THERAPY DIAG:  Right knee pain, unspecified chronicity  Difficulty in walking, not elsewhere classified  Muscle weakness (generalized)  Rationale for Evaluation and Treatment: Rehabilitation  ONSET DATE: s/p 11/13/23  SUBJECTIVE:   SUBJECTIVE STATEMENT: Pt worked today and reporting some stiffness and tightness.  EVAL:S/p right  TKA 10/03/23 per Dr. Abigail Abler; arrives with RW and waterproof bandage in place; TED hose  PERTINENT HISTORY: Here previous for knee pain PAIN:  Are you having pain? No  PRECAUTIONS: None  RED FLAGS: None   WEIGHT BEARING RESTRICTIONS: No  FALLS:  Has patient fallen in last 6 months? Yes. Number of falls 1 prior to surgery   OCCUPATION: retired but works some at [employer]  PLOF: Independent  PATIENT GOALS: walk normally without walker  NEXT MD VISIT: 12/11/23  OBJECTIVE:  Note: Objective measures were completed at Evaluation unless otherwise noted.  DIAGNOSTIC FINDINGS: none  PATIENT SURVEYS:  LEFS next visit 10/10/23 LEFS 8/80 10%  11/07/23 LEFS 26/80 32.5%  COGNITION: Overall cognitive status: Within functional limits for tasks assessed     SENSATION: WFL  EDEMA:  Normal for this time s/p   PALPATION: General soreness  LOWER EXTREMITY ROM: very guarded  Active ROM Right eval Left eval Right 10/10/23 Right 10/15/23 Right  10/17/23 Right  10/22/23 Right 11/01/23 Right 11/05/23: Right 11/14/23 Right 11/27/23 Right 11/29/23  Hip flexion             Hip extension             Hip abduction             Hip adduction  Hip internal rotation             Hip external rotation             Knee flexion 32  65 AAROM 74 AAROM 75 AAROM 72 90 90 AROM 90 degrees, AROM 98 degrees 110 113  Knee extension -15   -8 -10 -8 -5 -5  -3 5 from neutral  Ankle dorsiflexion Decreased versus left            Ankle plantarflexion             Ankle inversion             Ankle eversion              (Blank rows = not tested)  LOWER EXTREMITY MMT:  MMT Right eval Right 11/07/23  Hip flexion 2 4  Hip extension    Hip abduction    Hip adduction    Hip internal rotation    Hip external rotation    Knee flexion    Knee extension 2 (poor quad set) 3-  Ankle dorsiflexion    Ankle plantarflexion    Ankle inversion    Ankle eversion     (Blank rows = not  tested)  FUNCTIONAL TESTS:  Evaluation 5 times sit to stand: 34.98 sec using hands to assist and reliant on left leg 2 minute walk test: 32 ft with RW 11/07/23  5X STS no UE (leaning to Lt) 12.38 (was 34.98 using UE)  125' with RW (was 32 ft) with SPC 40' in 2 minutes 11/29/23: 362 ft no AD   GAIT: Distance walked: 32 ft Assistive device utilized: Environmental consultant - 2 wheeled Level of assistance: SBA Comments: slow pace; step to gait                                                                                                                                TREATMENT DATE: 12/02/2023  -Bike Seat full revolutions @ 5 minutes-pt declining cues for proper knee revolutions -Manual therapy x 9' posterior LE with active assist overpressure into extension with 10'' hold with R ankle over bolster.  -Sit to stands with 5lb KB with mirror for visual cues and proper symmetry during transfers x 15 -Sit to stands with 5lb KB with and 2in box under LLE for postural weight shift x 10 -Standing: Knee drive for flexion 5x 10 (6in step) -Tandem stance in// bars cues for weight 3x1' RLE only -SL standing with hip vectors 2 x 1' with finger assist at // bars -Standing calf stretch @ // bars x 1'   11/29/23:   : 345ft no AD Bike seat 8 -->7 full revolutions  5 minutes Standing:  Knee drive for flexion 5x 10 (40JW step)  Hamstring stretch on 12in step 3x 30   SLS Lt 24 Rt 29  Vector stance 3x 5  Knee flexion on 4in 10x 5 Seated:  Stool scoot forward/backward 1RT Supine:   Heel slides AROM 5-113 degrees Manual with LE elevated on wedge to medial and lateral aspect of knee    Media Information   Document Information  Photos  Rt knee with bumps near incision for last 3 weeks  11/29/2023 12:22  Attached To:  Outpatient Rehab on 11/29/23 with Terrilyn Fick, PTA  Source Information  Terrilyn Fick, PTA  [facility]    11/27/23: Bike seat 7 full  revolutions  5 minutes Standing:   Rt knee flexion stretch end range with toe on 4 step 10X Hip abduction 2X10 each LE Rt knee flexion onto 12 step 10X10  Hamstring stretch with overpressure onto 12 step 3X20  SLS Rt 10 Supine: manual Rt knee  AROM -3 to 110 degrees  Heelslides 10X  Quad sets 10X5 Prone knee flexion (for HEP)     PATIENT EDUCATION:  Education details: Patient educated on exam findings, POC, scope of PT, HEP, and what to expect next visit. Person educated: Patient Education method: Explanation, Demonstration, and Handouts Education comprehension: verbalized understanding, returned demonstration, verbal cues required, and tactile cues required  HOME EXERCISE PROGRAM: 11/12/23: - Prone Knee Extension Hang  - 2 x daily - 7 x weekly - 1 sets - 1 reps - 2'+ hold  11/05/23: - desensitization techniques on incision - TKE with ball against wall.  11/01/23 - Standing Hamstring Stretch with Step  - 2 x daily - 7 x weekly - 1 sets - 5 reps - 20 hold - knee bends on the step using rails to hold onto  - 2 x daily - 7 x weekly - 3 sets - 10 reps - 5 sec hold  Access Code: [email or web address] URL: [email or web address] Date: 10/07/2023 Prepared by: [facility]  Exercises - Supine Ankle Pumps  - 2-3 x daily - 7 x weekly - 2 sets - 10-20 reps - Supine Heel Slide  - 2-3 x daily - 7 x weekly - 3 sets - 10-20 reps - 5 hold - Seated Knee Flexion  - 2-3 x daily - 7 x weekly - 3 sets - 10-20 reps - 10 hold - Seated Long Arc Quad  - 2-3 x daily - 7 x weekly - 3 sets - 10-20 reps - Supine Quad Set  - 2-3 x daily - 7 x weekly - 3 sets - 10 reps - Supine Heel Slide with Strap  - 2-3 x daily - 7 x weekly - 3 sets - 10 reps - Seated Knee Extension AAROM  - 2-3 x daily - 7 x weekly - 3 sets - 10 reps - Supine Knee Extension Stretch on Towel Roll  - 2-3 x daily - 7 x weekly - 3 sets - 10 reps  ASSESSMENT:  CLINICAL IMPRESSION: Pt tolerating treatment session well today.  Progressing with more static balance trials with tandem and single leg stances. Pt with noticeable increased ankle strategies during interventions. Pt with notable swelling in RLE even with TEDhose donned. Pt sees MD next Wednesday for follow-up regarding progress. Pt will benefit from skilled Physical Therapy services to address deficits/limitations in order to improve functional and QOL.   Eval: Patient is a 68 y.o. female who was seen today for physical therapy evaluation and treatment for s/p Right TKA.  Patient demonstrates muscle weakness, reduced ROM, and fascial restrictions which are likely contributing to symptoms of pain and are negatively impacting patient ability to perform ADLs and functional mobility tasks. Patient will benefit  from skilled physical therapy services to address these deficits to reduce pain and improve level of function with ADLs and functional mobility tasks.   OBJECTIVE IMPAIRMENTS: Abnormal gait, decreased activity tolerance, decreased mobility, difficulty walking, decreased ROM, decreased strength, increased fascial restrictions, and pain.   ACTIVITY LIMITATIONS: carrying, lifting, bending, sitting, standing, squatting, sleeping, stairs, transfers, bed mobility, locomotion level, and caring for others  PARTICIPATION LIMITATIONS: meal prep, cleaning, laundry, driving, shopping, community activity, and yard work  [facility] POTENTIAL: Good  CLINICAL DECISION MAKING: Evolving/moderate complexity  EVALUATION COMPLEXITY: Moderate   GOALS: Goals reviewed with patient? Yes  SHORT TERM GOALS: Target date: 10/28/23 patient will be independent with initial HEP Baseline: Goal status: MET  2.  Patient will report 50% improvement overall Baseline:  Goal status: in progress   3.  Patient will increase right knee flexion to 90 to prepare for step navigation Baseline: 32 Goal status: MET    LONG TERM GOALS: Target date: 11/22/23  Patient will be independent in self  management strategies to improve quality of life and functional outcomes.  Baseline:  Goal status: in progress   2.  Patient will report 75% improvement overall  Baseline:  Goal status: in progress   3.  Patient will improve LEFS score by 10  points to demonstrate improved perceived function   Baseline: 8/80 Goal status: MET  4.   Patient will increase right leg MMT's to 5/5 to allow navigation of steps without gait deviation or loss of balance  Baseline: see above Goal status: in progress   5.  Patient will increase knee mobility to -2 to 120 to promote normal navigation of steps; step over step pattern  Baseline: see above Goal status: in progress   PLAN:  PT FREQUENCY: 2x/week  PT DURATION: 6 weeks  PLANNED INTERVENTIONS: 97164- PT Re-evaluation, 97110-Therapeutic exercises, 97530- Therapeutic activity, 97112- Neuromuscular re-education, 97535- Self Care, 16109- Manual therapy, [phone]- Gait training, [phone]- Orthotic Fit/training, [phone]- Canalith repositioning, [id number]- Aquatic Therapy, [phone]- Splinting, Patient/Family education, Balance training, Stair training, Taping, Dry Needling, Joint mobilization, Joint manipulation, Spinal manipulation, Spinal mobilization, Scar mobilization, and DME instructions.   PLAN FOR NEXT SESSION:  progress knee mobility and then strength; gait training without AD. Progress static balance challenges including tandem stance and vectors.   Gatha Kaska PT, DPT [facility] [phone] office    3:11 PM, 12/02/23

## 2023-12-04 ENCOUNTER — Ambulatory Visit (HOSPITAL_COMMUNITY)

## 2023-12-04 ENCOUNTER — Encounter (HOSPITAL_COMMUNITY): Payer: Self-pay

## 2023-12-04 DIAGNOSIS — M6281 Muscle weakness (generalized): Secondary | ICD-10-CM

## 2023-12-04 DIAGNOSIS — M25561 Pain in right knee: Secondary | ICD-10-CM

## 2023-12-04 DIAGNOSIS — R262 Difficulty in walking, not elsewhere classified: Secondary | ICD-10-CM

## 2023-12-04 DIAGNOSIS — M25661 Stiffness of right knee, not elsewhere classified: Secondary | ICD-10-CM

## 2023-12-04 NOTE — Therapy (Signed)
 OUTPATIENT PHYSICAL THERAPY LOWER EXTREMITY TREATMENT    Patient Name: Amanda Roberts MRN: [id number] DOB:[birth year], 68 y.o., female Today's Date: 12/04/2023  END OF SESSION:  PT End of Session - 12/04/23 1306     Visit Number 18    Number of Visits 24    Date for PT Re-Evaluation 12/26/23    Authorization Type [employer]    Authorization Time Period no auth; no limit    Progress Note Due on Visit 20   PN complete visit #10   PT Start Time 1306    PT Stop Time 1349    PT Time Calculation (min) 43 min    Activity Tolerance Patient tolerated treatment well    Behavior During Therapy WFL for tasks assessed/performed          Past Medical History:  Diagnosis Date   Asthma    Hypertension    PONV (postoperative nausea and vomiting)    Past Surgical History:  Procedure Laterality Date   ABDOMINAL HYSTERECTOMY     BREAST EXCISIONAL BIOPSY     BREAST LUMPECTOMY WITH RADIOACTIVE SEED LOCALIZATION Left 10/23/2019   Procedure: LEFT BREAST LUMPECTOMY WITH RADIOACTIVE SEED LOCALIZATION;  Surgeon: Caralyn Chandler, MD;  Location: [facility];  Service: General;  Laterality: Left;   CESAREAN SECTION     DG GALL BLADDER     FOOT SURGERY     Patient Active Problem List   Diagnosis Date Noted   At high risk for breast cancer 03/03/2020   Atypical ductal hyperplasia of left breast 11/27/2019   Essential hypertension 04/05/2017   Chest pain 03/06/2017   Family history of heart disease 03/06/2017    PCP: Jolynn Needy, PA  REFERRING PROVIDER: Saundra Curl, MD  REFERRING DIAG: Rt TKR on 10/03/2023  THERAPY DIAG:  Right knee pain, unspecified chronicity  Difficulty in walking, not elsewhere classified  Muscle weakness (generalized)  Stiffness of right knee, not elsewhere classified  Rationale for Evaluation and Treatment: Rehabilitation  ONSET DATE: s/p 11/13/23  SUBJECTIVE:   SUBJECTIVE STATEMENT: Feeling good today, worked today mainly  stiff and tightness.  Reports knee started to pop following last session.    EVAL:S/p right TKA 10/03/23 per Dr. Abigail Abler; arrives with RW and waterproof bandage in place; TED hose  PERTINENT HISTORY: Here previous for knee pain PAIN:  Are you having pain? No  PRECAUTIONS: None  RED FLAGS: None   WEIGHT BEARING RESTRICTIONS: No  FALLS:  Has patient fallen in last 6 months? Yes. Number of falls 1 prior to surgery   OCCUPATION: retired but works some at [employer]  PLOF: Independent  PATIENT GOALS: walk normally without walker  NEXT MD VISIT: 12/11/23  OBJECTIVE:  Note: Objective measures were completed at Evaluation unless otherwise noted.  DIAGNOSTIC FINDINGS: none  PATIENT SURVEYS:  LEFS next visit 10/10/23 LEFS 8/80 10%  11/07/23 LEFS 26/80 32.5%  COGNITION: Overall cognitive status: Within functional limits for tasks assessed     SENSATION: WFL  EDEMA:  Normal for this time s/p   PALPATION: General soreness  LOWER EXTREMITY ROM: very guarded  Active ROM Right eval Left eval Right 10/10/23 Right 10/15/23 Right  10/17/23 Right  10/22/23 Right 11/01/23 Right 11/05/23: Right 11/14/23 Right 11/27/23 Right 11/29/23  Hip flexion             Hip extension             Hip abduction  Hip adduction             Hip internal rotation             Hip external rotation             Knee flexion 32  65 AAROM 74 AAROM 75 AAROM 72 90 90 AROM 90 degrees, AROM 98 degrees 110 113  Knee extension -15   -8 -10 -8 -5 -5  -3 5 from neutral  Ankle dorsiflexion Decreased versus left            Ankle plantarflexion             Ankle inversion             Ankle eversion              (Blank rows = not tested)  LOWER EXTREMITY MMT:  MMT Right eval Right 11/07/23  Hip flexion 2 4  Hip extension    Hip abduction    Hip adduction    Hip internal rotation    Hip external rotation    Knee flexion    Knee extension 2 (poor quad set) 3-  Ankle dorsiflexion     Ankle plantarflexion    Ankle inversion    Ankle eversion     (Blank rows = not tested)  FUNCTIONAL TESTS:  Evaluation 5 times sit to stand: 34.98 sec using hands to assist and reliant on left leg 2 minute walk test: 32 ft with RW 11/07/23  5X STS no UE (leaning to Lt) 12.38 (was 34.98 using UE)  125' with RW (was 32 ft) with SPC 40' in 2 minutes 11/29/23: 362 ft no AD   GAIT: Distance walked: 32 ft Assistive device utilized: Environmental consultant - 2 wheeled Level of assistance: SBA Comments: slow pace; step to gait                                                                                                                                TREATMENT DATE: 12/04/23: Bike seat 7 full revolution x 5 min, cueing to reduce hip hike for increased knee mobility Standing:  -Heel raises incline slope 20x  -Knee drive on 16XW step 5x 20 holds for flexion  -TKE RTB 15x 5  - Lateral step up 4in 15x 2 HHA  - Forward step up 4in 15x  - Vector stance 3 x 5 holds with 1 HHA  - Tandem stance on foam no HHA 2x 30 Manual:  Decongestive techniques for edema control with LE elevated Supine:  AROM 5/6-113 degrees Seated:  Heel prop with 5# on thigh x 3'    12/02/2023  -Bike Seat full revolutions @ 5 minutes-pt declining cues for proper knee revolutions -Manual therapy x 9' posterior LE with active assist overpressure into extension with 10'' hold with R ankle over bolster.  -Sit to stands with 5lb KB with mirror for visual cues and proper  symmetry during transfers x 15 -Sit to stands with 5lb KB with and 2in box under LLE for postural weight shift x 10 -Standing: Knee drive for flexion 5x 10 (6in step) -Tandem stance in// bars cues for weight 3x1' RLE only -SL standing with hip vectors 2 x 1' with finger assist at // bars -Standing calf stretch @ // bars x 1'   11/29/23:   : 334ft no AD Bike seat 8 -->7 full revolutions  5 minutes Standing:  Knee drive for flexion 5x 10 (16XW  step)  Hamstring stretch on 12in step 3x 30   SLS Lt 24 Rt 29  Vector stance 3x 5  Knee flexion on 4in 10x 5 Seated:  Stool scoot forward/backward 1RT Supine:   Heel slides AROM 5-113 degrees Manual with LE elevated on wedge to medial and lateral aspect of knee    Media Information   Document Information  Photos  Rt knee with bumps near incision for last 3 weeks  11/29/2023 12:22  Attached To:  Outpatient Rehab on 11/29/23 with Terrilyn Fick, PTA  Source Information  Terrilyn Fick, PTA  [facility]    11/27/23: Bike seat 7 full revolutions  5 minutes Standing:   Rt knee flexion stretch end range with toe on 4 step 10X Hip abduction 2X10 each LE Rt knee flexion onto 12 step 10X10  Hamstring stretch with overpressure onto 12 step 3X20  SLS Rt 10 Supine: manual Rt knee  AROM -3 to 110 degrees  Heelslides 10X  Quad sets 10X5 Prone knee flexion (for HEP)     PATIENT EDUCATION:  Education details: Patient educated on exam findings, POC, scope of PT, HEP, and what to expect next visit. Person educated: Patient Education method: Explanation, Demonstration, and Handouts Education comprehension: verbalized understanding, returned demonstration, verbal cues required, and tactile cues required  HOME EXERCISE PROGRAM: 12/04/23: - Heel prop with weight on thigh  11/12/23: - Prone Knee Extension Hang  - 2 x daily - 7 x weekly - 1 sets - 1 reps - 2'+ hold  11/05/23: - desensitization techniques on incision - TKE with ball against wall.  11/01/23 - Standing Hamstring Stretch with Step  - 2 x daily - 7 x weekly - 1 sets - 5 reps - 20 hold - knee bends on the step using rails to hold onto  - 2 x daily - 7 x weekly - 3 sets - 10 reps - 5 sec hold  Access Code: [email or web address] URL: [email or web address] Date: 10/07/2023 Prepared by: [facility]  Exercises - Supine Ankle Pumps  - 2-3 x daily - 7 x weekly - 2 sets - 10-20 reps -  Supine Heel Slide  - 2-3 x daily - 7 x weekly - 3 sets - 10-20 reps - 5 hold - Seated Knee Flexion  - 2-3 x daily - 7 x weekly - 3 sets - 10-20 reps - 10 hold - Seated Long Arc Quad  - 2-3 x daily - 7 x weekly - 3 sets - 10-20 reps - Supine Quad Set  - 2-3 x daily - 7 x weekly - 3 sets - 10 reps - Supine Heel Slide with Strap  - 2-3 x daily - 7 x weekly - 3 sets - 10 reps - Seated Knee Extension AAROM  - 2-3 x daily - 7 x weekly - 3 sets - 10 reps - Supine Knee Extension Stretch on Towel Roll  - 2-3 x daily - 7 x weekly -  3 sets - 10 reps  ASSESSMENT:  CLINICAL IMPRESSION: Session focus with knee mobility, functional strength and balance.  Pt wearing thigh high compression garments but noted edema present.  Pt returned to work and increased standing time.  Manual decongestive techniques complete for edema control prior ROM measurement.  AROM 5-113 degrees.  Stretches and knee mobility exercises complete.  Was able to progress functional strengthening with additional step ups and squats with cueing to reduce compensation with circumduction movements and hip hiking during lateral step ups.  Was able to progress to dynamic surface during tandem stance with intermittent HHA and cueing for posture.  Pt tolerated well to session with no reports of increased pain.    Eval: Patient is a 69 y.o. female who was seen today for physical therapy evaluation and treatment for s/p Right TKA.  Patient demonstrates muscle weakness, reduced ROM, and fascial restrictions which are likely contributing to symptoms of pain and are negatively impacting patient ability to perform ADLs and functional mobility tasks. Patient will benefit from skilled physical therapy services to address these deficits to reduce pain and improve level of function with ADLs and functional mobility tasks.   OBJECTIVE IMPAIRMENTS: Abnormal gait, decreased activity tolerance, decreased mobility, difficulty walking, decreased ROM, decreased  strength, increased fascial restrictions, and pain.   ACTIVITY LIMITATIONS: carrying, lifting, bending, sitting, standing, squatting, sleeping, stairs, transfers, bed mobility, locomotion level, and caring for others  PARTICIPATION LIMITATIONS: meal prep, cleaning, laundry, driving, shopping, community activity, and yard work  [facility] POTENTIAL: Good  CLINICAL DECISION MAKING: Evolving/moderate complexity  EVALUATION COMPLEXITY: Moderate   GOALS: Goals reviewed with patient? Yes  SHORT TERM GOALS: Target date: 10/28/23 patient will be independent with initial HEP Baseline: Goal status: MET  2.  Patient will report 50% improvement overall Baseline:  Goal status: in progress   3.  Patient will increase right knee flexion to 90 to prepare for step navigation Baseline: 32 Goal status: MET    LONG TERM GOALS: Target date: 11/22/23  Patient will be independent in self management strategies to improve quality of life and functional outcomes.  Baseline:  Goal status: in progress   2.  Patient will report 75% improvement overall  Baseline:  Goal status: in progress   3.  Patient will improve LEFS score by 10  points to demonstrate improved perceived function   Baseline: 8/80 Goal status: MET  4.   Patient will increase right leg MMT's to 5/5 to allow navigation of steps without gait deviation or loss of balance  Baseline: see above Goal status: in progress   5.  Patient will increase knee mobility to -2 to 120 to promote normal navigation of steps; step over step pattern  Baseline: see above Goal status: in progress   PLAN:  PT FREQUENCY: 2x/week  PT DURATION: 6 weeks  PLANNED INTERVENTIONS: 97164- PT Re-evaluation, 97110-Therapeutic exercises, 97530- Therapeutic activity, 97112- Neuromuscular re-education, 97535- Self Care, 29562- Manual therapy, [phone]- Gait training, [phone]- Orthotic Fit/training, [phone]- Canalith repositioning, [id number]- Aquatic Therapy, [phone]-  Splinting, Patient/Family education, Balance training, Stair training, Taping, Dry Needling, Joint mobilization, Joint manipulation, Spinal manipulation, Spinal mobilization, Scar mobilization, and DME instructions.   PLAN FOR NEXT SESSION:  progress knee mobility and then strength; gait training without AD. Progress static balance challenges including tandem stance and vectors.   Minor Amble, LPTA/CLT; CBIS [phone]   2:05 PM, 12/04/23

## 2023-12-10 ENCOUNTER — Other Ambulatory Visit (HOSPITAL_COMMUNITY): Payer: Self-pay | Admitting: Hematology and Oncology

## 2023-12-10 ENCOUNTER — Ambulatory Visit (HOSPITAL_COMMUNITY): Admitting: Physical Therapy

## 2023-12-10 DIAGNOSIS — M25561 Pain in right knee: Secondary | ICD-10-CM

## 2023-12-10 DIAGNOSIS — R262 Difficulty in walking, not elsewhere classified: Secondary | ICD-10-CM

## 2023-12-10 DIAGNOSIS — M25661 Stiffness of right knee, not elsewhere classified: Secondary | ICD-10-CM

## 2023-12-10 DIAGNOSIS — Z1231 Encounter for screening mammogram for malignant neoplasm of breast: Secondary | ICD-10-CM

## 2023-12-10 DIAGNOSIS — M6281 Muscle weakness (generalized): Secondary | ICD-10-CM

## 2023-12-10 NOTE — Therapy (Signed)
 OUTPATIENT PHYSICAL THERAPY LOWER EXTREMITY TREATMENT    Patient Name: Amanda Roberts MRN: [id number] DOB:July 20, 1955, 68 y.o., female Today's Date: 12/10/2023  END OF SESSION:  PT End of Session - 12/10/23 1649     Visit Number 19    Number of Visits 24    Date for PT Re-Evaluation 12/26/23    Authorization Type [employer]    Authorization Time Period no auth; no limit    Progress Note Due on Visit 20   PN complete visit #10   PT Start Time 1350    PT Stop Time 1430    PT Time Calculation (min) 40 min    Activity Tolerance Patient tolerated treatment well    Behavior During Therapy WFL for tasks assessed/performed           Past Medical History:  Diagnosis Date   Asthma    Hypertension    PONV (postoperative nausea and vomiting)    Past Surgical History:  Procedure Laterality Date   ABDOMINAL HYSTERECTOMY     BREAST EXCISIONAL BIOPSY     BREAST LUMPECTOMY WITH RADIOACTIVE SEED LOCALIZATION Left 10/23/2019   Procedure: LEFT BREAST LUMPECTOMY WITH RADIOACTIVE SEED LOCALIZATION;  Surgeon: Curvin Deward MOULD, MD;  Location: [facility];  Service: General;  Laterality: Left;   CESAREAN SECTION     DG GALL BLADDER     FOOT SURGERY     Patient Active Problem List   Diagnosis Date Noted   At high risk for breast cancer 03/03/2020   Atypical ductal hyperplasia of left breast 11/27/2019   Essential hypertension 04/05/2017   Chest pain 03/06/2017   Family history of heart disease 03/06/2017    PCP: Jolee Elsie RAMAN, PA  REFERRING PROVIDER: Beverley Evalene BIRCH, MD  REFERRING DIAG: Rt TKR on 10/03/2023  THERAPY DIAG:  Right knee pain, unspecified chronicity  Difficulty in walking, not elsewhere classified  Muscle weakness (generalized)  Stiffness of right knee, not elsewhere classified  Rationale for Evaluation and Treatment: Rehabilitation  ONSET DATE: s/p 11/13/23  SUBJECTIVE:   SUBJECTIVE STATEMENT: Feeling good today overall.  Returns  to MD tomorrow.    EVAL:S/p right TKA 10/03/23 per Dr. Beverley; arrives with RW and waterproof bandage in place; TED hose  PERTINENT HISTORY: Here previous for knee pain PAIN:  Are you having pain? No  PRECAUTIONS: None  RED FLAGS: None   WEIGHT BEARING RESTRICTIONS: No  FALLS:  Has patient fallen in last 6 months? Yes. Number of falls 1 prior to surgery   OCCUPATION: retired but works some at [employer]  PLOF: Independent  PATIENT GOALS: walk normally without walker  NEXT MD VISIT: 12/11/23  OBJECTIVE:  Note: Objective measures were completed at Evaluation unless otherwise noted.  DIAGNOSTIC FINDINGS: none  PATIENT SURVEYS:  LEFS next visit 10/10/23 LEFS 8/80 10%  11/07/23 LEFS 26/80 32.5%  COGNITION: Overall cognitive status: Within functional limits for tasks assessed     SENSATION: WFL  EDEMA:  Normal for this time s/p   PALPATION: General soreness  LOWER EXTREMITY ROM: very guarded  Active ROM Right eval Left eval Right 10/10/23 Right 10/15/23 Right  10/17/23 Right  10/22/23 Right 11/01/23 Right 11/05/23: Right 11/14/23 Right 11/27/23 Right 11/29/23 Right 12/10/23  Hip flexion              Hip extension              Hip abduction  Hip adduction              Hip internal rotation              Hip external rotation              Knee flexion 32  65 AAROM 74 AAROM 75 AAROM 72 90 90 AROM 90 degrees, AROM 98 degrees 110 113 110 AROM 115 PROM  Knee extension -15   -8 -10 -8 -5 -5  -3 5 from neutral 5 from neutral  Ankle dorsiflexion Decreased versus left             Ankle plantarflexion              Ankle inversion              Ankle eversion               (Blank rows = not tested)  LOWER EXTREMITY MMT:  MMT Right eval Right 11/07/23  Hip flexion 2 4  Hip extension    Hip abduction    Hip adduction    Hip internal rotation    Hip external rotation    Knee flexion    Knee extension 2 (poor quad set) 3-  Ankle dorsiflexion     Ankle plantarflexion    Ankle inversion    Ankle eversion     (Blank rows = not tested)  FUNCTIONAL TESTS:  Evaluation 5 times sit to stand: 34.98 sec using hands to assist and reliant on left leg 2 minute walk test: 32 ft with RW 11/07/23  5X STS no UE (leaning to Lt) 12.38 (was 34.98 using UE)  125' with RW (was 32 ft) with SPC 40' in 2 minutes 11/29/23: 362 ft no AD   GAIT: Distance walked: 32 ft Assistive device utilized: Walker - 2 wheeled Level of assistance: SBA Comments: slow pace; step to gait                                                                                                                                TREATMENT DATE: 12/10/23: Bike seat 7 full revolution x 5 min Standing:  Toe raises decline slope 20X  Heel raises incline slope 20x  Knee drive on 87pw step 5x 20 holds for flexion  Hamstring stretch with 12 step 5X20 holds  Lateral step up 6in  2X10 with 1 UE assist  Forward step up 6in  2X10 with power ups 1 UE only  TKE bodycraft machine 3 plates [id number]  Vector stance 5 x 5 holds with 1 HHA                                           12/04/23: Bike seat 7 full revolution x 5 min, cueing to reduce hip hike for increased knee  mobility Standing:  -Heel raises incline slope 20x  -Knee drive on 12in step 5x 20 holds for flexion  -TKE RTB 15x 5  - Lateral step up 4in 15x 2 HHA  - Forward step up 4in 15x  - Vector stance 3 x 5 holds with 1 HHA  - Tandem stance on foam no HHA 2x 30 Manual:  Decongestive techniques for edema control with LE elevated Supine:  AROM 5/6-113 degrees Seated:  Heel prop with 5# on thigh x 3'    12/02/2023  -Bike Seat full revolutions @ 5 minutes-pt declining cues for proper knee revolutions -Manual therapy x 9' posterior LE with active assist overpressure into extension with 10'' hold with R ankle over bolster.  -Sit to stands with 5lb KB with mirror for visual cues and proper symmetry during transfers x  15 -Sit to stands with 5lb KB with and 2in box under LLE for postural weight shift x 10 -Standing: Knee drive for flexion 5x 10 (6in step) -Tandem stance in// bars cues for weight 3x1' RLE only -SL standing with hip vectors 2 x 1' with finger assist at // bars -Standing calf stretch @ // bars x 1'     PATIENT EDUCATION:  Education details: Patient educated on exam findings, POC, scope of PT, HEP, and what to expect next visit. Person educated: Patient Education method: Explanation, Demonstration, and Handouts Education comprehension: verbalized understanding, returned demonstration, verbal cues required, and tactile cues required  HOME EXERCISE PROGRAM: 12/04/23: - Heel prop with weight on thigh  11/12/23: - Prone Knee Extension Hang  - 2 x daily - 7 x weekly - 1 sets - 1 reps - 2'+ hold  11/05/23: - desensitization techniques on incision - TKE with ball against wall.  11/01/23 - Standing Hamstring Stretch with Step  - 2 x daily - 7 x weekly - 1 sets - 5 reps - 20 hold - knee bends on the step using rails to hold onto  - 2 x daily - 7 x weekly - 3 sets - 10 reps - 5 sec hold  Access Code: [email or web address] URL: [email or web address] Date: 10/07/2023 Prepared by: [facility]  Exercises - Supine Ankle Pumps  - 2-3 x daily - 7 x weekly - 2 sets - 10-20 reps - Supine Heel Slide  - 2-3 x daily - 7 x weekly - 3 sets - 10-20 reps - 5 hold - Seated Knee Flexion  - 2-3 x daily - 7 x weekly - 3 sets - 10-20 reps - 10 hold - Seated Long Arc Quad  - 2-3 x daily - 7 x weekly - 3 sets - 10-20 reps - Supine Quad Set  - 2-3 x daily - 7 x weekly - 3 sets - 10 reps - Supine Heel Slide with Strap  - 2-3 x daily - 7 x weekly - 3 sets - 10 reps - Seated Knee Extension AAROM  - 2-3 x daily - 7 x weekly - 3 sets - 10 reps - Supine Knee Extension Stretch on Towel Roll  - 2-3 x daily - 7 x weekly - 3 sets - 10 reps  ASSESSMENT:  CLINICAL IMPRESSION: Continued with focus on knee mobility,  functional strength and balance.  Pt overall doing well with AROM, however still presents with anxiety regarding it not being full as Lt.  Pt also worried about hamstring weakness.  Discussed progression to focus on this and to continue working hard at this at home.  Progressed to use of  bodycraft machine to provide resistance for TKE.  Pt with difficulty completing initially due to inability to isolate movement.  Instructed to increase stance time with ambulation as also continues to walk with limp.  Minimal edema present Rt LE as continues to wear thigh high compression garment. Minimal manual  completed prior to AROM measurement.  Able to achieve 110 flexion AROM and 115 PROM.  Extension remains lacking 5 digress from neutral.     Eval: Patient is a 68 y.o. female who was seen today for physical therapy evaluation and treatment for s/p Right TKA.  Patient demonstrates muscle weakness, reduced ROM, and fascial restrictions which are likely contributing to symptoms of pain and are negatively impacting patient ability to perform ADLs and functional mobility tasks. Patient will benefit from skilled physical therapy services to address these deficits to reduce pain and improve level of function with ADLs and functional mobility tasks.   OBJECTIVE IMPAIRMENTS: Abnormal gait, decreased activity tolerance, decreased mobility, difficulty walking, decreased ROM, decreased strength, increased fascial restrictions, and pain.   ACTIVITY LIMITATIONS: carrying, lifting, bending, sitting, standing, squatting, sleeping, stairs, transfers, bed mobility, locomotion level, and caring for others  PARTICIPATION LIMITATIONS: meal prep, cleaning, laundry, driving, shopping, community activity, and yard work  [facility] POTENTIAL: Good  CLINICAL DECISION MAKING: Evolving/moderate complexity  EVALUATION COMPLEXITY: Moderate   GOALS: Goals reviewed with patient? Yes  SHORT TERM GOALS: Target date: 10/28/23 patient will be  independent with initial HEP Baseline: Goal status: MET  2.  Patient will report 50% improvement overall Baseline:  Goal status: in progress   3.  Patient will increase right knee flexion to 90 to prepare for step navigation Baseline: 32 Goal status: MET    LONG TERM GOALS: Target date: 11/22/23  Patient will be independent in self management strategies to improve quality of life and functional outcomes.  Baseline:  Goal status: in progress   2.  Patient will report 75% improvement overall  Baseline:  Goal status: in progress   3.  Patient will improve LEFS score by 10  points to demonstrate improved perceived function   Baseline: 8/80 Goal status: MET  4.   Patient will increase right leg MMT's to 5/5 to allow navigation of steps without gait deviation or loss of balance  Baseline: see above Goal status: in progress   5.  Patient will increase knee mobility to -2 to 120 to promote normal navigation of steps; step over step pattern  Baseline: see above Goal status: in progress   PLAN:  PT FREQUENCY: 2x/week  PT DURATION: 6 weeks  PLANNED INTERVENTIONS: 97164- PT Re-evaluation, 97110-Therapeutic exercises, 97530- Therapeutic activity, 97112- Neuromuscular re-education, 97535- Self Care, 02859- Manual therapy, [phone]- Gait training, [phone]- Orthotic Fit/training, [phone]- Canalith repositioning, [id number]- Aquatic Therapy, [phone]- Splinting, Patient/Family education, Balance training, Stair training, Taping, Dry Needling, Joint mobilization, Joint manipulation, Spinal manipulation, Spinal mobilization, Scar mobilization, and DME instructions.   PLAN FOR NEXT SESSION:  progress knee mobility and strength. Begin squats and work on improving hamstring strength.    Greig KATHEE Fuse, PTA/CLT [facility] Ph: [phone]    4:49 PM, 12/10/23

## 2023-12-12 ENCOUNTER — Encounter (HOSPITAL_COMMUNITY): Payer: Self-pay

## 2023-12-12 ENCOUNTER — Ambulatory Visit (HOSPITAL_COMMUNITY)
Admission: RE | Admit: 2023-12-12 | Discharge: 2023-12-12 | Disposition: A | Discharge status: Home / Self Care | Source: Ambulatory Visit | Attending: Hematology and Oncology | Admitting: Hematology and Oncology

## 2023-12-12 DIAGNOSIS — Z1231 Encounter for screening mammogram for malignant neoplasm of breast: Secondary | ICD-10-CM | POA: Diagnosis not present

## 2023-12-13 ENCOUNTER — Encounter (HOSPITAL_COMMUNITY): Payer: Self-pay

## 2023-12-13 ENCOUNTER — Ambulatory Visit (HOSPITAL_COMMUNITY)

## 2023-12-13 DIAGNOSIS — M25561 Pain in right knee: Secondary | ICD-10-CM | POA: Diagnosis not present

## 2023-12-13 DIAGNOSIS — R262 Difficulty in walking, not elsewhere classified: Secondary | ICD-10-CM

## 2023-12-13 DIAGNOSIS — M25661 Stiffness of right knee, not elsewhere classified: Secondary | ICD-10-CM

## 2023-12-13 DIAGNOSIS — M6281 Muscle weakness (generalized): Secondary | ICD-10-CM

## 2023-12-13 NOTE — Therapy (Signed)
 OUTPATIENT PHYSICAL THERAPY LOWER EXTREMITY TREATMENT  Progress Note Reporting Period 11/07/23 to 12/13/23  See note below for Objective Data and Assessment of Progress/Goals.       Patient Name: Amanda Roberts MRN: [id number] DOB:01-22-1956, 68 y.o., female Today's Date: 12/13/2023  END OF SESSION:  PT End of Session - 12/13/23 1302     Visit Number 20    Number of Visits 24    Date for PT Re-Evaluation 12/26/23    Authorization Type [employer]    Authorization Time Period no auth; no limit    Progress Note Due on Visit 20    PT Start Time 1302    PT Stop Time 1348    PT Time Calculation (min) 46 min    Activity Tolerance Patient tolerated treatment well    Behavior During Therapy WFL for tasks assessed/performed          Past Medical History:  Diagnosis Date   Asthma    Hypertension    PONV (postoperative nausea and vomiting)    Past Surgical History:  Procedure Laterality Date   ABDOMINAL HYSTERECTOMY     BREAST EXCISIONAL BIOPSY     BREAST LUMPECTOMY WITH RADIOACTIVE SEED LOCALIZATION Left 10/23/2019   Procedure: LEFT BREAST LUMPECTOMY WITH RADIOACTIVE SEED LOCALIZATION;  Surgeon: Curvin Deward MOULD, MD;  Location: [facility];  Service: General;  Laterality: Left;   CESAREAN SECTION     DG GALL BLADDER     FOOT SURGERY     Patient Active Problem List   Diagnosis Date Noted   At high risk for breast cancer 03/03/2020   Atypical ductal hyperplasia of left breast 11/27/2019   Essential hypertension 04/05/2017   Chest pain 03/06/2017   Family history of heart disease 03/06/2017    PCP: Jolee Elsie RAMAN, PA  REFERRING PROVIDER: Beverley Evalene BIRCH, MD  REFERRING DIAG: Rt TKR on 10/03/2023  THERAPY DIAG:  Right knee pain, unspecified chronicity  Difficulty in walking, not elsewhere classified  Muscle weakness (generalized)  Stiffness of right knee, not elsewhere classified  Rationale for Evaluation and Treatment:  Rehabilitation  ONSET DATE: s/p 11/13/23  SUBJECTIVE:   SUBJECTIVE STATEMENT: MD happy with progress, returns in 2 months on 02/12/24.  Reports she went shopping in [place] following apt and able to stand/walk for 30 minutes tops.  EVAL:S/p right TKA 10/03/23 per Dr. Beverley; arrives with RW and waterproof bandage in place; TED hose  PERTINENT HISTORY: Here previous for knee pain PAIN:  Are you having pain? No  PRECAUTIONS: None  RED FLAGS: None   WEIGHT BEARING RESTRICTIONS: No  FALLS:  Has patient fallen in last 6 months? Yes. Number of falls 1 prior to surgery   OCCUPATION: retired but works some at [employer]  PLOF: Independent  PATIENT GOALS: walk normally without walker  NEXT MD VISIT: 12/11/23  OBJECTIVE:  Note: Objective measures were completed at Evaluation unless otherwise noted.  DIAGNOSTIC FINDINGS: none  PATIENT SURVEYS:  LEFS next visit 10/10/23 LEFS 8/80 10%  11/07/23 LEFS 26/80 32.5%  12/13/23: Lower Extremity Functional Score: 56 / 80 = 70.0 %   COGNITION: Overall cognitive status: Within functional limits for tasks assessed     SENSATION: WFL  EDEMA:  Normal for this time s/p   PALPATION: General soreness  LOWER EXTREMITY ROM: very guarded  Active ROM Right eval Left eval Right 10/10/23 Right 10/15/23 Right  10/17/23 Right  10/22/23 Right 11/01/23 Right 11/05/23: Right 11/14/23 Right 11/27/23 Right 11/29/23 Right 12/10/23  Right 12/13/23  Hip flexion               Hip extension               Hip abduction               Hip adduction               Hip internal rotation               Hip external rotation               Knee flexion 32  65 AAROM 74 AAROM 75 AAROM 72 90 90 AROM 90 degrees, AROM 98 degrees 110 113 110 AROM 115 PROM AROM 113 PROM 115   Knee extension -15   -8 -10 -8 -5 -5  -3 5 from neutral 5 from neutral 5 from neautral  Ankle dorsiflexion Decreased versus left              Ankle plantarflexion               Ankle  inversion               Ankle eversion                (Blank rows = not tested)  LOWER EXTREMITY MMT:  MMT Right eval Right 11/07/23 Right 12/13/23  Hip flexion 2 4 4+  Hip extension   3+  Hip abduction   4-  Hip adduction     Hip internal rotation     Hip external rotation     Knee flexion   4-  Knee extension 2 (poor quad set) 3- 4-  Ankle dorsiflexion     Ankle plantarflexion     Ankle inversion     Ankle eversion      (Blank rows = not tested)  FUNCTIONAL TESTS:  Evaluation 5 times sit to stand: 34.98 sec using hands to assist and reliant on left leg 2 minute walk test: 32 ft with RW 11/07/23  5X STS no UE (leaning to Lt) 12.38 (was 34.98 using UE)  125' with RW (was 32 ft) with SPC 40' in 2 minutes 11/29/23: 362 ft no AD 12/13/23: 345ft no AD  5X STS no UE  (equal weight bearing): 13.21; 8.64   GAIT: Distance walked: 32 ft Assistive device utilized: Environmental consultant - 2 wheeled Level of assistance: SBA Comments: slow pace; step to gait                                                                                                                                TREATMENT DATE: 12/13/23: 36ft no AD Bike seat 7 full revolution x 5 min Seated infront of treadmill walking 5x 1'  MMT see above Prone: knee flexion 10x  Supine: Heel slides AROM: 5-115 degress Standing: Bodycraft TKE 2Pl x 20  Squats front of  chair with cueing for mechanics 10x Leg press 10x 3Pl 20x 4Pl  12/10/23: Bike seat 7 full revolution x 5 min Standing:  Toe raises decline slope 20X  Heel raises incline slope 20x  Knee drive on 87pw step 5x 20 holds for flexion  Hamstring stretch with 12 step 5X20 holds  Lateral step up 6in  2X10 with 1 UE assist  Forward step up 6in  2X10 with power ups 1 UE only  TKE bodycraft machine 3 plates 7K89  Vector stance 5 x 5 holds with 1 HHA                                           12/04/23: Bike seat 7 full revolution x 5 min, cueing to  reduce hip hike for increased knee mobility Standing:  -Heel raises incline slope 20x  -Knee drive on 87pw step 5x 20 holds for flexion  -TKE RTB 15x 5  - Lateral step up 4in 15x 2 HHA  - Forward step up 4in 15x  - Vector stance 3 x 5 holds with 1 HHA  - Tandem stance on foam no HHA 2x 30 Manual:  Decongestive techniques for edema control with LE elevated Supine:  AROM 5/6-113 degrees Seated:  Heel prop with 5# on thigh x 3'    12/02/2023  -Bike Seat full revolutions @ 5 minutes-pt declining cues for proper knee revolutions -Manual therapy x 9' posterior LE with active assist overpressure into extension with 10'' hold with R ankle over bolster.  -Sit to stands with 5lb KB with mirror for visual cues and proper symmetry during transfers x 15 -Sit to stands with 5lb KB with and 2in box under LLE for postural weight shift x 10 -Standing: Knee drive for flexion 5x 10 (6in step) -Tandem stance in// bars cues for weight 3x1' RLE only -SL standing with hip vectors 2 x 1' with finger assist at // bars -Standing calf stretch @ // bars x 1'     PATIENT EDUCATION:  Education details: Patient educated on exam findings, POC, scope of PT, HEP, and what to expect next visit. Person educated: Patient Education method: Explanation, Demonstration, and Handouts Education comprehension: verbalized understanding, returned demonstration, verbal cues required, and tactile cues required  HOME EXERCISE PROGRAM: 12/13/23:   Begin walking program  12/04/23: - Heel prop with weight on thigh  11/12/23: - Prone Knee Extension Hang  - 2 x daily - 7 x weekly - 1 sets - 1 reps - 2'+ hold  11/05/23: - desensitization techniques on incision - TKE with ball against wall.  11/01/23 - Standing Hamstring Stretch with Step  - 2 x daily - 7 x weekly - 1 sets - 5 reps - 20 hold - knee bends on the step using rails to hold onto  - 2 x daily - 7 x weekly - 3 sets - 10 reps - 5 sec hold  Access Code:  [email or web address] URL: [email or web address] Date: 10/07/2023 Prepared by: AP - Rehab  Exercises - Supine Ankle Pumps  - 2-3 x daily - 7 x weekly - 2 sets - 10-20 reps - Supine Heel Slide  - 2-3 x daily - 7 x weekly - 3 sets - 10-20 reps - 5 hold - Seated Knee Flexion  - 2-3 x daily - 7 x weekly - 3 sets - 10-20 reps - 10 hold - Seated Long Arc  Quad  - 2-3 x daily - 7 x weekly - 3 sets - 10-20 reps - Supine Quad Set  - 2-3 x daily - 7 x weekly - 3 sets - 10 reps - Supine Heel Slide with Strap  - 2-3 x daily - 7 x weekly - 3 sets - 10 reps - Seated Knee Extension AAROM  - 2-3 x daily - 7 x weekly - 3 sets - 10 reps - Supine Knee Extension Stretch on Towel Roll  - 2-3 x daily - 7 x weekly - 3 sets - 10 reps  ASSESSMENT:  CLINICAL IMPRESSION: 12/13/23:  20th session, reviewed goals.  Pt reports fatigue following 30 minute of walking, educated benefits of beginning walking program.  Pt has met 3/3 STG and 3/5 LTGs.  Pt presents with increased cadence with with no AD, antalgic gait mechanics with difficulty flexing knee and toe push off.  AROM 5-115 degrees.  MMT complete with noted weakness in hip and hamstring strength.  This session added some exercises focusing on strengthening specific musculature with good tolerance and no reports of pain.  Vast improvements with self perceived functional abilities noted with LEFS survey, improved from 10% initial eval to 70% today.  Pt will continued to benefit from skilled intervention to address functional strengthenig and ROM.     Eval: Patient is a 68 y.o. female who was seen today for physical therapy evaluation and treatment for s/p Right TKA.  Patient demonstrates muscle weakness, reduced ROM, and fascial restrictions which are likely contributing to symptoms of pain and are negatively impacting patient ability to perform ADLs and functional mobility tasks. Patient will benefit from skilled physical therapy services to address these deficits to  reduce pain and improve level of function with ADLs and functional mobility tasks.   OBJECTIVE IMPAIRMENTS: Abnormal gait, decreased activity tolerance, decreased mobility, difficulty walking, decreased ROM, decreased strength, increased fascial restrictions, and pain.   ACTIVITY LIMITATIONS: carrying, lifting, bending, sitting, standing, squatting, sleeping, stairs, transfers, bed mobility, locomotion level, and caring for others  PARTICIPATION LIMITATIONS: meal prep, cleaning, laundry, driving, shopping, community activity, and yard work  [facility] POTENTIAL: Good  CLINICAL DECISION MAKING: Evolving/moderate complexity  EVALUATION COMPLEXITY: Moderate   GOALS: Goals reviewed with patient? Yes  SHORT TERM GOALS: Target date: 10/28/23 patient will be independent with initial HEP Baseline: Goal status: MET  2.  Patient will report 50% improvement overall Baseline: 12/13/23:  Reports improved by 75% Goal status: MET   3.  Patient will increase right knee flexion to 90 to prepare for step navigation Baseline: 32 Goal status: MET    LONG TERM GOALS: Target date: 11/22/23  Patient will be independent in self management strategies to improve quality of life and functional outcomes.  Baseline:  Goal status: MET  2.  Patient will report 75% improvement overall  Baseline: 12/13/23:  Reports improved by 75% Goal status: MET  3.  Patient will improve LEFS score by 10  points to demonstrate improved perceived function   Baseline: 8/80; 12/13/23: Lower Extremity Functional Score: 56 / 80 = 70.0 % Goal status: MET  4.   Patient will increase right leg MMT's to 5/5 to allow navigation of steps without gait deviation or loss of balance  Baseline: see above Goal status: in progress   5.  Patient will increase knee mobility to -2 to 120 to promote normal navigation of steps; step over step pattern  Baseline: see above;  Goal status: in progress   PLAN:  PT FREQUENCY:  2x/week  PT DURATION: 6 weeks  PLANNED INTERVENTIONS: 97164- PT Re-evaluation, 97110-Therapeutic exercises, 97530- Therapeutic activity, 97112- Neuromuscular re-education, [phone]- Self Care, 02859- Manual therapy, [phone]- Gait training, [phone]- Orthotic Fit/training, [phone]- Canalith repositioning, [id number]- Aquatic Therapy, [phone]- Splinting, Patient/Family education, Balance training, Stair training, Taping, Dry Needling, Joint mobilization, Joint manipulation, Spinal manipulation, Spinal mobilization, Scar mobilization, and DME instructions.   PLAN FOR NEXT SESSION:  progress knee mobility and strength. Continue functional strengthening exercise, continue squats, hamstring strengthening and glut med strengthening.    Augustin Mclean, LPTA/CLT; CBIS [phone]   3:43 PM, 12/13/23

## 2023-12-16 ENCOUNTER — Encounter (HOSPITAL_COMMUNITY): Payer: Self-pay

## 2023-12-16 ENCOUNTER — Ambulatory Visit (HOSPITAL_COMMUNITY)

## 2023-12-16 DIAGNOSIS — M25561 Pain in right knee: Secondary | ICD-10-CM

## 2023-12-16 DIAGNOSIS — M6281 Muscle weakness (generalized): Secondary | ICD-10-CM

## 2023-12-16 DIAGNOSIS — R262 Difficulty in walking, not elsewhere classified: Secondary | ICD-10-CM

## 2023-12-16 NOTE — Therapy (Addendum)
 OUTPATIENT PHYSICAL THERAPY LOWER EXTREMITY TREATMENT  Progress Note Reporting Period 11/07/23 to 12/13/23  See note below for Objective Data and Assessment of Progress/Goals.       Patient Name: Amanda Roberts MRN: [id number] DOB:[birth year], 68 y.o., female Today's Date: 12/16/2023  END OF SESSION:  PT End of Session - 12/16/23 1102     Visit Number 21    Number of Visits 24    Date for PT Re-Evaluation 12/26/23    Authorization Type [employer]    Authorization Time Period no auth; no limit    Progress Note Due on Visit 24    PT Start Time 1024    PT Stop Time 1102    PT Time Calculation (min) 38 min    Activity Tolerance Patient tolerated treatment well    Behavior During Therapy WFL for tasks assessed/performed           Past Medical History:  Diagnosis Date   Asthma    Hypertension    PONV (postoperative nausea and vomiting)    Past Surgical History:  Procedure Laterality Date   ABDOMINAL HYSTERECTOMY     BREAST EXCISIONAL BIOPSY     BREAST LUMPECTOMY WITH RADIOACTIVE SEED LOCALIZATION Left 10/23/2019   Procedure: LEFT BREAST LUMPECTOMY WITH RADIOACTIVE SEED LOCALIZATION;  Surgeon: Curvin Deward MOULD, MD;  Location: [facility];  Service: General;  Laterality: Left;   CESAREAN SECTION     DG GALL BLADDER     FOOT SURGERY     Patient Active Problem List   Diagnosis Date Noted   At high risk for breast cancer 03/03/2020   Atypical ductal hyperplasia of left breast 11/27/2019   Essential hypertension 04/05/2017   Chest pain 03/06/2017   Family history of heart disease 03/06/2017    PCP: Jolee Elsie RAMAN, PA  REFERRING PROVIDER: Beverley Evalene BIRCH, MD  REFERRING DIAG: Rt TKR on 10/03/2023  THERAPY DIAG:  Right knee pain, unspecified chronicity  Difficulty in walking, not elsewhere classified  Muscle weakness (generalized)  Rationale for Evaluation and Treatment: Rehabilitation  ONSET DATE: s/p 11/13/23  SUBJECTIVE:    SUBJECTIVE STATEMENT: Pt reporting she is still having difficulty with putting on her pants.   EVAL:S/p right TKA 10/03/23 per Dr. Beverley; arrives with RW and waterproof bandage in place; TED hose  PERTINENT HISTORY: Here previous for knee pain PAIN:  Are you having pain? No  PRECAUTIONS: None  RED FLAGS: None   WEIGHT BEARING RESTRICTIONS: No  FALLS:  Has patient fallen in last 6 months? Yes. Number of falls 1 prior to surgery   OCCUPATION: retired but works some at [employer]  PLOF: Independent  PATIENT GOALS: walk normally without walker  NEXT MD VISIT: 12/11/23  OBJECTIVE:  Note: Objective measures were completed at Evaluation unless otherwise noted.  DIAGNOSTIC FINDINGS: none  PATIENT SURVEYS:  LEFS next visit 10/10/23 LEFS 8/80 10%  11/07/23 LEFS 26/80 32.5%  12/13/23: Lower Extremity Functional Score: 56 / 80 = 70.0 %   COGNITION: Overall cognitive status: Within functional limits for tasks assessed     SENSATION: WFL  EDEMA:  Normal for this time s/p   PALPATION: General soreness  LOWER EXTREMITY ROM: very guarded  Active ROM Right eval Left eval Right 10/10/23 Right 10/15/23 Right  10/17/23 Right  10/22/23 Right 11/01/23 Right 11/05/23: Right 11/14/23 Right 11/27/23 Right 11/29/23 Right 12/10/23 Right 12/13/23  Hip flexion               Hip extension  Hip abduction               Hip adduction               Hip internal rotation               Hip external rotation               Knee flexion 32  65 AAROM 74 AAROM 75 AAROM 72 90 90 AROM 90 degrees, AROM 98 degrees 110 113 110 AROM 115 PROM AROM 113 PROM 115   Knee extension -15   -8 -10 -8 -5 -5  -3 5 from neutral 5 from neutral 5 from neautral  Ankle dorsiflexion Decreased versus left              Ankle plantarflexion               Ankle inversion               Ankle eversion                (Blank rows = not tested)  LOWER EXTREMITY MMT:  MMT Right eval  Right 11/07/23 Right 12/13/23  Hip flexion 2 4 4+  Hip extension   3+  Hip abduction   4-  Hip adduction     Hip internal rotation     Hip external rotation     Knee flexion   4-  Knee extension 2 (poor quad set) 3- 4-  Ankle dorsiflexion     Ankle plantarflexion     Ankle inversion     Ankle eversion      (Blank rows = not tested)  FUNCTIONAL TESTS:  Evaluation 5 times sit to stand: 34.98 sec using hands to assist and reliant on left leg 2 minute walk test: 32 ft with RW 11/07/23  5X STS no UE (leaning to Lt) 12.38 (was 34.98 using UE)  125' with RW (was 32 ft) with SPC 40' in 2 minutes 11/29/23: 362 ft no AD 12/13/23: 387ft no AD  5X STS no UE  (equal weight bearing): 13.21; 8.64   GAIT: Distance walked: 32 ft Assistive device utilized: Environmental consultant - 2 wheeled Level of assistance: SBA Comments: slow pace; step to gait                                                                                                                                TREATMENT DATE: 12/16/2023  -Bike full revoluation x 5' seat 7 -Manual: -passive extension in supine 10x10'' -passive knee flexion in supine with therapist assisting over arm/towel 10x10 -scar tissue mobilization and patella mobilization  -prone passive knee flexion 2 x 1'  12/13/23: 382ft no AD Bike seat 7 full revolution x 5 min Seated infront of treadmill walking 5x 1'  MMT see above Prone: knee flexion 10x  Supine: Heel slides AROM: 5-115 degress Standing: Bodycraft TKE 2Pl  x 20  Squats front of chair with cueing for mechanics 10x Leg press 10x 3Pl 20x 4Pl  12/10/23: Bike seat 7 full revolution x 5 min Standing:  Toe raises decline slope 20X  Heel raises incline slope 20x  Knee drive on 87pw step 5x 20 holds for flexion  Hamstring stretch with 12 step 5X20 holds  Lateral step up 6in  2X10 with 1 UE assist  Forward step up 6in  2X10 with power ups 1 UE only  TKE bodycraft machine 3 plates  7K89  Vector stance 5 x 5 holds with 1 HHA                                             PATIENT EDUCATION:  Education details: Patient educated on exam findings, POC, scope of PT, HEP, and what to expect next visit. Person educated: Patient Education method: Explanation, Demonstration, and Handouts Education comprehension: verbalized understanding, returned demonstration, verbal cues required, and tactile cues required  HOME EXERCISE PROGRAM: 12/13/23:   Begin walking program  12/04/23: - Heel prop with weight on thigh  11/12/23: - Prone Knee Extension Hang  - 2 x daily - 7 x weekly - 1 sets - 1 reps - 2'+ hold  11/05/23: - desensitization techniques on incision - TKE with ball against wall.  11/01/23 - Standing Hamstring Stretch with Step  - 2 x daily - 7 x weekly - 1 sets - 5 reps - 20 hold - knee bends on the step using rails to hold onto  - 2 x daily - 7 x weekly - 3 sets - 10 reps - 5 sec hold  Access Code: [email or web address] URL: [email or web address] Date: 10/07/2023 Prepared by: AP - Rehab  Exercises - Supine Ankle Pumps  - 2-3 x daily - 7 x weekly - 2 sets - 10-20 reps - Supine Heel Slide  - 2-3 x daily - 7 x weekly - 3 sets - 10-20 reps - 5 hold - Seated Knee Flexion  - 2-3 x daily - 7 x weekly - 3 sets - 10-20 reps - 10 hold - Seated Long Arc Quad  - 2-3 x daily - 7 x weekly - 3 sets - 10-20 reps - Supine Quad Set  - 2-3 x daily - 7 x weekly - 3 sets - 10 reps - Supine Heel Slide with Strap  - 2-3 x daily - 7 x weekly - 3 sets - 10 reps - Seated Knee Extension AAROM  - 2-3 x daily - 7 x weekly - 3 sets - 10 reps - Supine Knee Extension Stretch on Towel Roll  - 2-3 x daily - 7 x weekly - 3 sets - 10 reps  ASSESSMENT:  CLINICAL IMPRESSION: Pt tolerating therapy session well. Focus on continued passive mobility interventions to increase. Continues with RLE swelling throughout all session. Educated pt on reducing 1x/ day for mobility activities.  Pt will  continued to benefit from skilled intervention to address functional strengthenig and ROM.     Eval: Patient is a 68 y.o. female who was seen today for physical therapy evaluation and treatment for s/p Right TKA.  Patient demonstrates muscle weakness, reduced ROM, and fascial restrictions which are likely contributing to symptoms of pain and are negatively impacting patient ability to perform ADLs and functional mobility tasks. Patient will benefit from skilled physical therapy services to address these  deficits to reduce pain and improve level of function with ADLs and functional mobility tasks.   OBJECTIVE IMPAIRMENTS: Abnormal gait, decreased activity tolerance, decreased mobility, difficulty walking, decreased ROM, decreased strength, increased fascial restrictions, and pain.   ACTIVITY LIMITATIONS: carrying, lifting, bending, sitting, standing, squatting, sleeping, stairs, transfers, bed mobility, locomotion level, and caring for others  PARTICIPATION LIMITATIONS: meal prep, cleaning, laundry, driving, shopping, community activity, and yard work  [facility] POTENTIAL: Good  CLINICAL DECISION MAKING: Evolving/moderate complexity  EVALUATION COMPLEXITY: Moderate   GOALS: Goals reviewed with patient? Yes  SHORT TERM GOALS: Target date: 10/28/23 patient will be independent with initial HEP Baseline: Goal status: MET  2.  Patient will report 50% improvement overall Baseline: 12/13/23:  Reports improved by 75% Goal status: MET   3.  Patient will increase right knee flexion to 90 to prepare for step navigation Baseline: 32 Goal status: MET    LONG TERM GOALS: Target date: 11/22/23  Patient will be independent in self management strategies to improve quality of life and functional outcomes.  Baseline:  Goal status: MET  2.  Patient will report 75% improvement overall  Baseline: 12/13/23:  Reports improved by 75% Goal status: MET  3.  Patient will improve LEFS score by 10  points  to demonstrate improved perceived function   Baseline: 8/80; 12/13/23: Lower Extremity Functional Score: 56 / 80 = 70.0 % Goal status: MET  4.   Patient will increase right leg MMT's to 5/5 to allow navigation of steps without gait deviation or loss of balance  Baseline: see above Goal status: in progress   5.  Patient will increase knee mobility to -2 to 120 to promote normal navigation of steps; step over step pattern  Baseline: see above;  Goal status: in progress   PLAN:  PT FREQUENCY: 2x/week  PT DURATION: 6 weeks  PLANNED INTERVENTIONS: 97164- PT Re-evaluation, 97110-Therapeutic exercises, 97530- Therapeutic activity, 97112- Neuromuscular re-education, 97535- Self Care, 02859- Manual therapy, [phone]- Gait training, [id number]- Orthotic Fit/training, [phone]- Canalith repositioning, [id number]- Aquatic Therapy, [phone]- Splinting, Patient/Family education, Balance training, Stair training, Taping, Dry Needling, Joint mobilization, Joint manipulation, Spinal manipulation, Spinal mobilization, Scar mobilization, and DME instructions.   PLAN FOR NEXT SESSION: *work on reducing RLE swelling- edema management- lymphedema technqiues** progress knee mobility and strength. Continue functional strengthening exercise, continue squats, hamstring strengthening and glut med strengthening.    Omega JONETTA Bottcher PT, DPT [facility]- [facility] office   11:02 AM, 12/16/23

## 2023-12-18 ENCOUNTER — Ambulatory Visit (HOSPITAL_COMMUNITY): Attending: Orthopedic Surgery

## 2023-12-18 DIAGNOSIS — M6281 Muscle weakness (generalized): Secondary | ICD-10-CM | POA: Insufficient documentation

## 2023-12-18 DIAGNOSIS — M25661 Stiffness of right knee, not elsewhere classified: Secondary | ICD-10-CM | POA: Diagnosis not present

## 2023-12-18 DIAGNOSIS — M25561 Pain in right knee: Secondary | ICD-10-CM | POA: Insufficient documentation

## 2023-12-18 DIAGNOSIS — R262 Difficulty in walking, not elsewhere classified: Secondary | ICD-10-CM | POA: Insufficient documentation

## 2023-12-18 NOTE — Therapy (Signed)
 OUTPATIENT PHYSICAL THERAPY LOWER EXTREMITY TREATMENT  Patient Name: Amanda Roberts MRN: [id number] DOB:1956-05-01, 68 y.o., female Today's Date: 12/18/2023  END OF SESSION:  PT End of Session - 12/18/23 1320     Visit Number 22    Number of Visits 24    Date for PT Re-Evaluation 12/26/23    Authorization Type [employer]    Authorization Time Period no auth; no limit    PT Start Time 1100    PT Stop Time 1145    PT Time Calculation (min) 45 min    Activity Tolerance Patient tolerated treatment well    Behavior During Therapy WFL for tasks assessed/performed            Past Medical History:  Diagnosis Date   Asthma    Hypertension    PONV (postoperative nausea and vomiting)    Past Surgical History:  Procedure Laterality Date   ABDOMINAL HYSTERECTOMY     BREAST EXCISIONAL BIOPSY     BREAST LUMPECTOMY WITH RADIOACTIVE SEED LOCALIZATION Left 10/23/2019   Procedure: LEFT BREAST LUMPECTOMY WITH RADIOACTIVE SEED LOCALIZATION;  Surgeon: Curvin Deward MOULD, MD;  Location: [facility];  Service: General;  Laterality: Left;   CESAREAN SECTION     DG GALL BLADDER     FOOT SURGERY     Patient Active Problem List   Diagnosis Date Noted   At high risk for breast cancer 03/03/2020   Atypical ductal hyperplasia of left breast 11/27/2019   Essential hypertension 04/05/2017   Chest pain 03/06/2017   Family history of heart disease 03/06/2017    PCP: Jolee Elsie RAMAN, PA  REFERRING PROVIDER: Beverley Evalene BIRCH, MD  REFERRING DIAG: Rt TKR on 10/03/2023  THERAPY DIAG:  Right knee pain, unspecified chronicity  Difficulty in walking, not elsewhere classified  Muscle weakness (generalized)  Stiffness of right knee, not elsewhere classified  Rationale for Evaluation and Treatment: Rehabilitation  ONSET DATE: s/p 11/13/23  SUBJECTIVE:   SUBJECTIVE STATEMENT: Pt reporting she has been doing well but still can't get her knee to bend back. Feels stiff in the  front.   EVAL:S/p right TKA 10/03/23 per Dr. Beverley; arrives with RW and waterproof bandage in place; TED hose  PERTINENT HISTORY: Here previous for knee pain PAIN:  Are you having pain? No  PRECAUTIONS: None  RED FLAGS: None   WEIGHT BEARING RESTRICTIONS: No  FALLS:  Has patient fallen in last 6 months? Yes. Number of falls 1 prior to surgery   OCCUPATION: retired but works some at [employer]  PLOF: Independent  PATIENT GOALS: walk normally without walker  NEXT MD VISIT: 12/11/23  OBJECTIVE:  Note: Objective measures were completed at Evaluation unless otherwise noted.  DIAGNOSTIC FINDINGS: none  PATIENT SURVEYS:  LEFS next visit 10/10/23 LEFS 8/80 10%  11/07/23 LEFS 26/80 32.5%  12/13/23: Lower Extremity Functional Score: 56 / 80 = 70.0 %   COGNITION: Overall cognitive status: Within functional limits for tasks assessed     SENSATION: WFL  EDEMA:  Normal for this time s/p   PALPATION: General soreness  LOWER EXTREMITY ROM: very guarded  Active ROM Right eval Left eval Right 10/10/23 Right 10/15/23 Right  10/17/23 Right  10/22/23 Right 11/01/23 Right 11/05/23: Right 11/14/23 Right 11/27/23 Right 11/29/23 Right 12/10/23 Right 12/13/23  Hip flexion               Hip extension               Hip  abduction               Hip adduction               Hip internal rotation               Hip external rotation               Knee flexion 32  65 AAROM 74 AAROM 75 AAROM 72 90 90 AROM 90 degrees, AROM 98 degrees 110 113 110 AROM 115 PROM AROM 113 PROM 115   Knee extension -15   -8 -10 -8 -5 -5  -3 5 from neutral 5 from neutral 5 from neautral  Ankle dorsiflexion Decreased versus left              Ankle plantarflexion               Ankle inversion               Ankle eversion                (Blank rows = not tested)  LOWER EXTREMITY MMT:  MMT Right eval Right 11/07/23 Right 12/13/23  Hip flexion 2 4 4+  Hip extension   3+  Hip abduction   4-  Hip  adduction     Hip internal rotation     Hip external rotation     Knee flexion   4-  Knee extension 2 (poor quad set) 3- 4-  Ankle dorsiflexion     Ankle plantarflexion     Ankle inversion     Ankle eversion      (Blank rows = not tested)  FUNCTIONAL TESTS:  Evaluation 5 times sit to stand: 34.98 sec using hands to assist and reliant on left leg 2 minute walk test: 32 ft with RW 11/07/23  5X STS no UE (leaning to Lt) 12.38 (was 34.98 using UE)  125' with RW (was 32 ft) with SPC 40' in 2 minutes 11/29/23: 362 ft no AD 12/13/23: 359ft no AD  5X STS no UE  (equal weight bearing): 13.21; 8.64   GAIT: Distance walked: 32 ft Assistive device utilized: Environmental consultant - 2 wheeled Level of assistance: SBA Comments: slow pace; step to gait                                                                                                                                TREATMENT DATE: 12/18/23 TE - Bike full revolution x6 min seat 7 - Step stretch 20 repetitions (5s) - SLR with quad activation holds 20 reps Manual - passive extension in supine 10x10'' - passive knee flexion in supine with therapist assisting over arm/towel 10x10 TA - Squat to stand with stagger stance of R LE posteriorly and L LE in front for improving functional flexion tolerance to end range - Education on continuing to perform HEP as tolerated and icing management -  Side step ups bilaterally with min-no support  12/16/2023  -Bike full revoluation x 5' seat 7 -Manual: -passive extension in supine 10x10'' -passive knee flexion in supine with therapist assisting over arm/towel 10x10 -scar tissue mobilization and patella mobilization  -prone passive knee flexion 2 x 1'  12/13/23: 358ft no AD Bike seat 7 full revolution x 5 min Seated infront of treadmill walking 5x 1'  MMT see above Prone: knee flexion 10x  Supine: Heel slides AROM: 5-115 degress Standing: [employer] TKE 2Pl x 20  Squats front of  chair with cueing for mechanics 10x Leg press 10x 3Pl 20x 4Pl                                    PATIENT EDUCATION:  Education details: Patient educated on exam findings, POC, scope of PT, HEP, and what to expect next visit. Person educated: Patient Education method: Explanation, Demonstration, and Handouts Education comprehension: verbalized understanding, returned demonstration, verbal cues required, and tactile cues required  HOME EXERCISE PROGRAM: 12/13/23:   Begin walking program  12/04/23: - Heel prop with weight on thigh  11/12/23: - Prone Knee Extension Hang  - 2 x daily - 7 x weekly - 1 sets - 1 reps - 2'+ hold  11/05/23: - desensitization techniques on incision - TKE with ball against wall.  11/01/23 - Standing Hamstring Stretch with Step  - 2 x daily - 7 x weekly - 1 sets - 5 reps - 20 hold - knee bends on the step using rails to hold onto  - 2 x daily - 7 x weekly - 3 sets - 10 reps - 5 sec hold  Access Code: [email or web address] URL: [email or web address] Date: 10/07/2023 Prepared by: [facility] - Rehab  Exercises - Supine Ankle Pumps  - 2-3 x daily - 7 x weekly - 2 sets - 10-20 reps - Supine Heel Slide  - 2-3 x daily - 7 x weekly - 3 sets - 10-20 reps - 5 hold - Seated Knee Flexion  - 2-3 x daily - 7 x weekly - 3 sets - 10-20 reps - 10 hold - Seated Long Arc Quad  - 2-3 x daily - 7 x weekly - 3 sets - 10-20 reps - Supine Quad Set  - 2-3 x daily - 7 x weekly - 3 sets - 10 reps - Supine Heel Slide with Strap  - 2-3 x daily - 7 x weekly - 3 sets - 10 reps - Seated Knee Extension AAROM  - 2-3 x daily - 7 x weekly - 3 sets - 10 reps - Supine Knee Extension Stretch on Towel Roll  - 2-3 x daily - 7 x weekly - 3 sets - 10 reps  ASSESSMENT:  CLINICAL IMPRESSION: Pt tolerating therapy well with cues for improving functional flexion and quad activation in supine. Stretching into flexion and extension performed via manual and educated on addition of stagger sit to stand as well  as SLR in supine with quad activation secondary to noting decreased quad activation during quad sets and glut dominant attempts at knee extension. Recommend continued skilled PT services for improving functional gait and step ups as well.     Eval: Patient is a 68 y.o. female who was seen today for physical therapy evaluation and treatment for s/p Right TKA.  Patient demonstrates muscle weakness, reduced ROM, and fascial restrictions which are likely contributing to symptoms of pain and  are negatively impacting patient ability to perform ADLs and functional mobility tasks. Patient will benefit from skilled physical therapy services to address these deficits to reduce pain and improve level of function with ADLs and functional mobility tasks.   OBJECTIVE IMPAIRMENTS: Abnormal gait, decreased activity tolerance, decreased mobility, difficulty walking, decreased ROM, decreased strength, increased fascial restrictions, and pain.   ACTIVITY LIMITATIONS: carrying, lifting, bending, sitting, standing, squatting, sleeping, stairs, transfers, bed mobility, locomotion level, and caring for others  PARTICIPATION LIMITATIONS: meal prep, cleaning, laundry, driving, shopping, community activity, and yard work  [facility] POTENTIAL: Good  CLINICAL DECISION MAKING: Evolving/moderate complexity  EVALUATION COMPLEXITY: Moderate   GOALS: Goals reviewed with patient? Yes  SHORT TERM GOALS: Target date: 10/28/23 patient will be independent with initial HEP Baseline: Goal status: MET  2.  Patient will report 50% improvement overall Baseline: 12/13/23:  Reports improved by 75% Goal status: MET   3.  Patient will increase right knee flexion to 90 to prepare for step navigation Baseline: 32 Goal status: MET    LONG TERM GOALS: Target date: 11/22/23  Patient will be independent in self management strategies to improve quality of life and functional outcomes.  Baseline:  Goal status: MET  2.  Patient will  report 75% improvement overall  Baseline: 12/13/23:  Reports improved by 75% Goal status: MET  3.  Patient will improve LEFS score by 10  points to demonstrate improved perceived function   Baseline: 8/80; 12/13/23: Lower Extremity Functional Score: 56 / 80 = 70.0 % Goal status: MET  4.   Patient will increase right leg MMT's to 5/5 to allow navigation of steps without gait deviation or loss of balance  Baseline: see above Goal status: in progress   5.  Patient will increase knee mobility to -2 to 120 to promote normal navigation of steps; step over step pattern  Baseline: see above;  Goal status: in progress   PLAN:  PT FREQUENCY: 2x/week  PT DURATION: 6 weeks  PLANNED INTERVENTIONS: 97164- PT Re-evaluation, 97110-Therapeutic exercises, 97530- Therapeutic activity, 97112- Neuromuscular re-education, 97535- Self Care, 02859- Manual therapy, [phone]- Gait training, [phone]- Orthotic Fit/training, [phone]- Canalith repositioning, [id number]- Aquatic Therapy, [phone]- Splinting, Patient/Family education, Balance training, Stair training, Taping, Dry Needling, Joint mobilization, Joint manipulation, Spinal manipulation, Spinal mobilization, Scar mobilization, and DME instructions.   PLAN FOR NEXT SESSION: *work on reducing RLE swelling- edema management- lymphedema technqiues** progress knee mobility and strength. Continue functional strengthening exercise, continue squats, hamstring strengthening and glut med strengthening.    Lamarr LITTIE Citrin PT, DPT [facility] [phone] office   1:21 PM, 12/18/23

## 2023-12-25 ENCOUNTER — Ambulatory Visit (HOSPITAL_COMMUNITY)

## 2023-12-25 ENCOUNTER — Encounter (HOSPITAL_COMMUNITY): Payer: Self-pay

## 2023-12-25 DIAGNOSIS — R262 Difficulty in walking, not elsewhere classified: Secondary | ICD-10-CM

## 2023-12-25 DIAGNOSIS — M25561 Pain in right knee: Secondary | ICD-10-CM | POA: Diagnosis not present

## 2023-12-25 NOTE — Therapy (Addendum)
 OUTPATIENT PHYSICAL THERAPY LOWER EXTREMITY TREATMENT Progress Note Reporting Period 11/14/23 to 12/26/2023  See note below for Objective Data and Assessment of Progress/Goals.      Patient Name: JENISA MONTY MRN: [id number] DOB:1956/05/11, 68 y.o., female Today's Date: 12/25/2023  END OF SESSION:  PT End of Session - 12/25/23 1457     Visit Number 23    Number of Visits 29    Date for PT Re-Evaluation 12/26/23    Authorization Type [employer]    Authorization Time Period no auth; no limit    Progress Note Due on Visit 29    PT Start Time 1430    PT Stop Time 1509    PT Time Calculation (min) 39 min    Activity Tolerance Patient tolerated treatment well    Behavior During Therapy WFL for tasks assessed/performed            Past Medical History:  Diagnosis Date   Asthma    Hypertension    PONV (postoperative nausea and vomiting)    Past Surgical History:  Procedure Laterality Date   ABDOMINAL HYSTERECTOMY     BREAST EXCISIONAL BIOPSY     BREAST LUMPECTOMY WITH RADIOACTIVE SEED LOCALIZATION Left 10/23/2019   Procedure: LEFT BREAST LUMPECTOMY WITH RADIOACTIVE SEED LOCALIZATION;  Surgeon: Curvin Deward MOULD, MD;  Location: [facility];  Service: General;  Laterality: Left;   CESAREAN SECTION     DG GALL BLADDER     FOOT SURGERY     Patient Active Problem List   Diagnosis Date Noted   At high risk for breast cancer 03/03/2020   Atypical ductal hyperplasia of left breast 11/27/2019   Essential hypertension 04/05/2017   Chest pain 03/06/2017   Family history of heart disease 03/06/2017    PCP: Jolee Elsie RAMAN, PA  REFERRING PROVIDER: Beverley Evalene BIRCH, MD  REFERRING DIAG: Rt TKR on 10/03/2023  THERAPY DIAG:  Right knee pain, unspecified chronicity  Difficulty in walking, not elsewhere classified  Rationale for Evaluation and Treatment: Rehabilitation  ONSET DATE: s/p 11/13/23  SUBJECTIVE:   SUBJECTIVE STATEMENT: No pain and pt  reports some soreness laterally on her right knee.   EVAL:S/p right TKA 10/03/23 per Dr. Beverley; arrives with RW and waterproof bandage in place; TED hose  PERTINENT HISTORY: Here previous for knee pain PAIN:  Are you having pain? No  PRECAUTIONS: None  RED FLAGS: None   WEIGHT BEARING RESTRICTIONS: No  FALLS:  Has patient fallen in last 6 months? Yes. Number of falls 1 prior to surgery   OCCUPATION: retired but works some at [employer]  PLOF: Independent  PATIENT GOALS: walk normally without walker  NEXT MD VISIT: 12/11/23  OBJECTIVE:  Note: Objective measures were completed at Evaluation unless otherwise noted.  DIAGNOSTIC FINDINGS: none  PATIENT SURVEYS:  LEFS next visit 10/10/23 LEFS 8/80 10%  11/07/23 LEFS 26/80 32.5%  12/13/23: Lower Extremity Functional Score: 56 / 80 = 70.0 %   COGNITION: Overall cognitive status: Within functional limits for tasks assessed     SENSATION: WFL  EDEMA:  Normal for this time s/p   PALPATION: General soreness  LOWER EXTREMITY ROM: very guarded  Active ROM Right eval Left eval Right 10/10/23 Right 10/15/23 Right  10/17/23 Right  10/22/23 Right 11/01/23 Right 11/05/23: Right 11/14/23 Right 11/27/23 Right 11/29/23 Right 12/10/23 Right 12/13/23 Right 12/25/2023  Hip flexion                Hip extension  Hip abduction                Hip adduction                Hip internal rotation                Hip external rotation                Knee flexion 32  65 AAROM 74 AAROM 75 AAROM 72 90 90 AROM 90 degrees, AROM 98 degrees 110 113 110 AROM 115 PROM AROM 113 PROM 115  AROM 115  Knee extension -15   -8 -10 -8 -5 -5  -3 5 from neutral 5 from neutral 5 from neautral 3 from 0  Ankle dorsiflexion Decreased versus left               Ankle plantarflexion                Ankle inversion                Ankle eversion                 (Blank rows = not tested)  LOWER EXTREMITY MMT:  MMT Right eval  Right 11/07/23 Right 12/13/23 Right 12/25/2023  Hip flexion 2 4 4+ 4+  Hip extension   3+ 4  Hip abduction   4- 4  Hip adduction      Hip internal rotation      Hip external rotation      Knee flexion   4- 4-  Knee extension 2 (poor quad set) 3- 4- 4-  Ankle dorsiflexion      Ankle plantarflexion      Ankle inversion      Ankle eversion       (Blank rows = not tested)  FUNCTIONAL TESTS:  Evaluation 5 times sit to stand: 34.98 sec using hands to assist and reliant on left leg 2 minute walk test: 32 ft with RW 11/07/23  5X STS no UE (leaning to Lt) 12.38 (was 34.98 using UE)  125' with RW (was 32 ft) with SPC 40' in 2 minutes 11/29/23: 362 ft no AD 12/13/23: 347ft no AD  5X STS no UE  (equal weight bearing): 13.21; 8.64   GAIT: Distance walked: 32 ft Assistive device utilized: Environmental consultant - 2 wheeled Level of assistance: SBA Comments: slow pace; step to gait                                                                                                                                TREATMENT DATE: 12/25/2023  -2x10 staggered sit/stand with cues for proper pelvic posture -2x12 7in step ups with no UE assist -Quadruped rocks 6x10'' -Prone knee hang x 2' -Prone knee flexion with 3lb ankle weight x 12, w/ 5lb ankle weight x 12, w/ 7.5lb -ROM -MMT  12/18/23 TE - Bike full revolution  x6 min seat 7 - Step stretch 20 repetitions (5s) - SLR with quad activation holds 20 reps Manual - passive extension in supine 10x10'' - passive knee flexion in supine with therapist assisting over arm/towel 10x10 TA - Squat to stand with stagger stance of R LE posteriorly and L LE in front for improving functional flexion tolerance to end range - Education on continuing to perform HEP as tolerated and icing management - Side step ups bilaterally with min-no support  12/16/2023  -Bike full revoluation x 5' seat 7 -Manual: -passive extension in supine 10x10'' -passive knee flexion  in supine with therapist assisting over arm/towel 10x10 -scar tissue mobilization and patella mobilization  -prone passive knee flexion 2 x 1'  PATIENT EDUCATION:  Education details: Patient educated on exam findings, POC, scope of PT, HEP, and what to expect next visit. Person educated: Patient Education method: Explanation, Demonstration, and Handouts Education comprehension: verbalized understanding, returned demonstration, verbal cues required, and tactile cues required  HOME EXERCISE PROGRAM: 12/13/23:   Begin walking program  12/04/23: - Heel prop with weight on thigh  11/12/23: - Prone Knee Extension Hang  - 2 x daily - 7 x weekly - 1 sets - 1 reps - 2'+ hold  11/05/23: - desensitization techniques on incision - TKE with ball against wall.  11/01/23 - Standing Hamstring Stretch with Step  - 2 x daily - 7 x weekly - 1 sets - 5 reps - 20 hold - knee bends on the step using rails to hold onto  - 2 x daily - 7 x weekly - 3 sets - 10 reps - 5 sec hold  Access Code: [email or web address] URL: [email or web address] Date: 10/07/2023 Prepared by: [facility] - Rehab  Exercises - Supine Ankle Pumps  - 2-3 x daily - 7 x weekly - 2 sets - 10-20 reps - Supine Heel Slide  - 2-3 x daily - 7 x weekly - 3 sets - 10-20 reps - 5 hold - Seated Knee Flexion  - 2-3 x daily - 7 x weekly - 3 sets - 10-20 reps - 10 hold - Seated Long Arc Quad  - 2-3 x daily - 7 x weekly - 3 sets - 10-20 reps - Supine Quad Set  - 2-3 x daily - 7 x weekly - 3 sets - 10 reps - Supine Heel Slide with Strap  - 2-3 x daily - 7 x weekly - 3 sets - 10 reps - Seated Knee Extension AAROM  - 2-3 x daily - 7 x weekly - 3 sets - 10 reps - Supine Knee Extension Stretch on Towel Roll  - 2-3 x daily - 7 x weekly - 3 sets - 10 reps  ASSESSMENT:  CLINICAL IMPRESSION: Progress note completed again today. ROM completed and small improvements by 2 degrees in both flexion and extension, pt with 3 degrees from 0 and 115 AROM today, which  is noticeable since minor plateua had seemed to occur. Pt with overall improvements. Recommend 3 more weeks for 2x week to finalized ROM and strengthening. Recommend continued skilled PT services for improving functional gait and step ups as well.     Eval: Patient is a 68 y.o. female who was seen today for physical therapy evaluation and treatment for s/p Right TKA.  Patient demonstrates muscle weakness, reduced ROM, and fascial restrictions which are likely contributing to symptoms of pain and are negatively impacting patient ability to perform ADLs and functional mobility tasks. Patient will benefit from skilled physical therapy services  to address these deficits to reduce pain and improve level of function with ADLs and functional mobility tasks.   OBJECTIVE IMPAIRMENTS: Abnormal gait, decreased activity tolerance, decreased mobility, difficulty walking, decreased ROM, decreased strength, increased fascial restrictions, and pain.   ACTIVITY LIMITATIONS: carrying, lifting, bending, sitting, standing, squatting, sleeping, stairs, transfers, bed mobility, locomotion level, and caring for others  PARTICIPATION LIMITATIONS: meal prep, cleaning, laundry, driving, shopping, community activity, and yard work  [facility] POTENTIAL: Good  CLINICAL DECISION MAKING: Evolving/moderate complexity  EVALUATION COMPLEXITY: Moderate   GOALS: Goals reviewed with patient? Yes  SHORT TERM GOALS: Target date: 10/28/23 patient will be independent with initial HEP Baseline: Goal status: MET  2.  Patient will report 50% improvement overall Baseline: 12/13/23:  Reports improved by 75% Goal status: MET   3.  Patient will increase right knee flexion to 90 to prepare for step navigation Baseline: 32 Goal status: MET    LONG TERM GOALS: Target date: 11/22/23  Patient will be independent in self management strategies to improve quality of life and functional outcomes.  Baseline:  Goal status: MET  2.   Patient will report 75% improvement overall  Baseline: 12/13/23:  Reports improved by 75% Goal status: MET  3.  Patient will improve LEFS score by 10  points to demonstrate improved perceived function   Baseline: 8/80; 12/13/23: Lower Extremity Functional Score: 56 / 80 = 70.0 % Goal status: MET  4.   Patient will increase right leg MMT's to 5/5 to allow navigation of steps without gait deviation or loss of balance  Baseline: see above Goal status: in progress   5.  Patient will increase knee mobility to -2 to 120 to promote normal navigation of steps; step over step pattern  Baseline: see above;  Goal status: in progress   PLAN:  PT FREQUENCY: 2x/week  PT DURATION: 6 weeks  PLANNED INTERVENTIONS: 97164- PT Re-evaluation, 97110-Therapeutic exercises, 97530- Therapeutic activity, 97112- Neuromuscular re-education, 97535- Self Care, 02859- Manual therapy, [phone]- Gait training, [phone]- Orthotic Fit/training, [phone]- Canalith repositioning, [id number]- Aquatic Therapy, [phone]- Splinting, Patient/Family education, Balance training, Stair training, Taping, Dry Needling, Joint mobilization, Joint manipulation, Spinal manipulation, Spinal mobilization, Scar mobilization, and DME instructions.   PLAN FOR NEXT SESSION: hamstring strengthening, push past small amounts of pain   Omega JONETTA Donna ALMETA, DPT [facility] [place] [phone] office 3:13 PM, 12/25/23

## 2023-12-25 NOTE — Addendum Note (Signed)
 Addended by: DONNA DEL D on: 12/25/2023 03:15 PM   Modules accepted: Orders

## 2023-12-26 DIAGNOSIS — I10 Essential (primary) hypertension: Secondary | ICD-10-CM | POA: Diagnosis not present

## 2023-12-26 DIAGNOSIS — Z0000 Encounter for general adult medical examination without abnormal findings: Secondary | ICD-10-CM | POA: Diagnosis not present

## 2023-12-26 DIAGNOSIS — Z131 Encounter for screening for diabetes mellitus: Secondary | ICD-10-CM | POA: Diagnosis not present

## 2023-12-26 DIAGNOSIS — Z13220 Encounter for screening for lipoid disorders: Secondary | ICD-10-CM | POA: Diagnosis not present

## 2023-12-27 ENCOUNTER — Encounter (HOSPITAL_COMMUNITY): Payer: Self-pay

## 2023-12-27 ENCOUNTER — Ambulatory Visit (HOSPITAL_COMMUNITY)

## 2023-12-27 DIAGNOSIS — R262 Difficulty in walking, not elsewhere classified: Secondary | ICD-10-CM

## 2023-12-27 DIAGNOSIS — M25561 Pain in right knee: Secondary | ICD-10-CM | POA: Diagnosis not present

## 2023-12-27 DIAGNOSIS — M25661 Stiffness of right knee, not elsewhere classified: Secondary | ICD-10-CM

## 2023-12-27 DIAGNOSIS — M6281 Muscle weakness (generalized): Secondary | ICD-10-CM

## 2023-12-27 NOTE — Therapy (Signed)
 OUTPATIENT PHYSICAL THERAPY LOWER EXTREMITY TREATMENT   Patient Name: Amanda Roberts MRN: [id number] DOB:02/25/1956, 68 y.o., female Today's Date: 12/27/2023  END OF SESSION:  PT End of Session - 12/27/23 0846     Visit Number 24    Number of Visits 29    Date for PT Re-Evaluation 12/26/23    Authorization Type [employer]    Authorization Time Period no auth; no limit    Progress Note Due on Visit 29    PT Start Time 0847    PT Stop Time 0926    PT Time Calculation (min) 39 min    Equipment Utilized During Treatment --    Activity Tolerance Patient tolerated treatment well    Behavior During Therapy WFL for tasks assessed/performed             Past Medical History:  Diagnosis Date   Asthma    Hypertension    PONV (postoperative nausea and vomiting)    Past Surgical History:  Procedure Laterality Date   ABDOMINAL HYSTERECTOMY     BREAST EXCISIONAL BIOPSY     BREAST LUMPECTOMY WITH RADIOACTIVE SEED LOCALIZATION Left 10/23/2019   Procedure: LEFT BREAST LUMPECTOMY WITH RADIOACTIVE SEED LOCALIZATION;  Surgeon: Curvin Deward MOULD, MD;  Location: [facility];  Service: General;  Laterality: Left;   CESAREAN SECTION     DG GALL BLADDER     FOOT SURGERY     Patient Active Problem List   Diagnosis Date Noted   At high risk for breast cancer 03/03/2020   Atypical ductal hyperplasia of left breast 11/27/2019   Essential hypertension 04/05/2017   Chest pain 03/06/2017   Family history of heart disease 03/06/2017    PCP: Jolee Elsie RAMAN, PA  REFERRING PROVIDER: Beverley Evalene BIRCH, MD  REFERRING DIAG: Rt TKR on 10/03/2023  THERAPY DIAG:  Right knee pain, unspecified chronicity  Difficulty in walking, not elsewhere classified  Muscle weakness (generalized)  Stiffness of right knee, not elsewhere classified  Rationale for Evaluation and Treatment: Rehabilitation  ONSET DATE: s/p 11/13/23  SUBJECTIVE:   SUBJECTIVE STATEMENT: Pt reports still  dealing with swelling on right side of right knee and knee tightness, she wishes this would go away. No pain medication at this time.   EVAL:S/p right TKA 10/03/23 per Dr. Beverley; arrives with RW and waterproof bandage in place; TED hose  PERTINENT HISTORY: Here previous for knee pain PAIN:  Are you having pain? No  PRECAUTIONS: None  RED FLAGS: None   WEIGHT BEARING RESTRICTIONS: No  FALLS:  Has patient fallen in last 6 months? Yes. Number of falls 1 prior to surgery   OCCUPATION: retired but works some at [employer]  PLOF: Independent  PATIENT GOALS: walk normally without walker  NEXT MD VISIT: 12/11/23  OBJECTIVE:  Note: Objective measures were completed at Evaluation unless otherwise noted.  DIAGNOSTIC FINDINGS: none  PATIENT SURVEYS:  LEFS next visit 10/10/23 LEFS 8/80 10%  11/07/23 LEFS 26/80 32.5%  12/13/23: Lower Extremity Functional Score: 56 / 80 = 70.0 %   COGNITION: Overall cognitive status: Within functional limits for tasks assessed     SENSATION: WFL  EDEMA:  Normal for this time s/p   PALPATION: General soreness  LOWER EXTREMITY ROM: very guarded  Active ROM Right eval Left eval Right 10/10/23 Right 10/15/23 Right  10/17/23 Right  10/22/23 Right 11/01/23 Right 11/05/23: Right 11/14/23 Right 11/27/23 Right 11/29/23 Right 12/10/23 Right 12/13/23 Right 12/25/2023  Hip flexion  Hip extension                Hip abduction                Hip adduction                Hip internal rotation                Hip external rotation                Knee flexion 32  65 AAROM 74 AAROM 75 AAROM 72 90 90 AROM 90 degrees, AROM 98 degrees 110 113 110 AROM 115 PROM AROM 113 PROM 115  AROM 115  Knee extension -15   -8 -10 -8 -5 -5  -3 5 from neutral 5 from neutral 5 from neautral 3 from 0  Ankle dorsiflexion Decreased versus left               Ankle plantarflexion                Ankle inversion                Ankle eversion                  (Blank rows = not tested)  LOWER EXTREMITY MMT:  MMT Right eval Right 11/07/23 Right 12/13/23 Right 12/25/2023  Hip flexion 2 4 4+ 4+  Hip extension   3+ 4  Hip abduction   4- 4  Hip adduction      Hip internal rotation      Hip external rotation      Knee flexion   4- 4-  Knee extension 2 (poor quad set) 3- 4- 4-  Ankle dorsiflexion      Ankle plantarflexion      Ankle inversion      Ankle eversion       (Blank rows = not tested)  FUNCTIONAL TESTS:  Evaluation 5 times sit to stand: 34.98 sec using hands to assist and reliant on left leg 2 minute walk test: 32 ft with RW 11/07/23  5X STS no UE (leaning to Lt) 12.38 (was 34.98 using UE)  125' with RW (was 32 ft) with SPC 40' in 2 minutes 11/29/23: 362 ft no AD 12/13/23: 373ft no AD  5X STS no UE  (equal weight bearing): 13.21; 8.64   GAIT: Distance walked: 32 ft Assistive device utilized: Environmental consultant - 2 wheeled Level of assistance: SBA Comments: slow pace; step to gait                                                                                                                                TREATMENT DATE: 12/27/2023  Therapeutic Exercise: -Stationary bike, 5 minutes, seat 6, unable to power bike -Knee drive/extension stretch, 1 set 8 reps, 10 second holds, pt cued for increased OP with RUE on both stretches -Ankle weight  marches/buttkicks, 20 foot line, 3 laps, 3 pound ankle weights -3lb ankle weight walks over 9 inch hurdles, pt requires SBA -Forward lunges onto bosu ball, 2 sets of 6 reps better performance going into LLE, pt cued for core activation and upright posture, pt requires BUE support  Therapeutic Activity: -Sled pushes, 40 lbs, 50 foot laps, 2 laps, pt cued for smaller step and continuous movement of sled -Step up and overs, 2 sets of 7 reps, pt cued for decreased UE support -Lateral step up and overs, 2 sets of 5 reps bilaterally, pt cued for decreased UE support  12/25/2023  -2x10  staggered sit/stand with cues for proper pelvic posture -2x12 7in step ups with no UE assist -Quadruped rocks 6x10'' -Prone knee hang x 2' -Prone knee flexion with 3lb ankle weight x 12, w/ 5lb ankle weight x 12, w/ 7.5lb -ROM -MMT  12/18/23 TE - Bike full revolution x6 min seat 7 - Step stretch 20 repetitions (5s) - SLR with quad activation holds 20 reps Manual - passive extension in supine 10x10'' - passive knee flexion in supine with therapist assisting over arm/towel 10x10 TA - Squat to stand with stagger stance of R LE posteriorly and L LE in front for improving functional flexion tolerance to end range - Education on continuing to perform HEP as tolerated and icing management - Side step ups bilaterally with min-no support   PATIENT EDUCATION:  Education details: Patient educated on exam findings, POC, scope of PT, HEP, and what to expect next visit. Person educated: Patient Education method: Explanation, Demonstration, and Handouts Education comprehension: verbalized understanding, returned demonstration, verbal cues required, and tactile cues required  HOME EXERCISE PROGRAM: 12/13/23:   Begin walking program  12/04/23: - Heel prop with weight on thigh  11/12/23: - Prone Knee Extension Hang  - 2 x daily - 7 x weekly - 1 sets - 1 reps - 2'+ hold  11/05/23: - desensitization techniques on incision - TKE with ball against wall.  11/01/23 - Standing Hamstring Stretch with Step  - 2 x daily - 7 x weekly - 1 sets - 5 reps - 20 hold - knee bends on the step using rails to hold onto  - 2 x daily - 7 x weekly - 3 sets - 10 reps - 5 sec hold  Access Code: [email or web address] URL: [email or web address] Date: 10/07/2023 Prepared by: [facility] - Rehab  Exercises - Supine Ankle Pumps  - 2-3 x daily - 7 x weekly - 2 sets - 10-20 reps - Supine Heel Slide  - 2-3 x daily - 7 x weekly - 3 sets - 10-20 reps - 5 hold - Seated Knee Flexion  - 2-3 x daily - 7 x weekly - 3 sets - 10-20  reps - 10 hold - Seated Long Arc Quad  - 2-3 x daily - 7 x weekly - 3 sets - 10-20 reps - Supine Quad Set  - 2-3 x daily - 7 x weekly - 3 sets - 10 reps - Supine Heel Slide with Strap  - 2-3 x daily - 7 x weekly - 3 sets - 10 reps - Seated Knee Extension AAROM  - 2-3 x daily - 7 x weekly - 3 sets - 10 reps - Supine Knee Extension Stretch on Towel Roll  - 2-3 x daily - 7 x weekly - 3 sets - 10 reps  ASSESSMENT:  CLINICAL IMPRESSION: Patient continues to demonstrate decreased RLE strength, ROM decreased gait quality. Patient also demonstrates good effort  with new exercises and like the variation of exercises today. Patient able to progress dynamic balance and core activation exercises today with bosu lunges and ankle weight walking, good performance with verbal cueing. Patient would continue to benefit from skilled physical therapy for decreased tightness of R knee, increased endurance with ambulation, increased RLE strength, and improved ROM of right knee for improved quality of life, improved independence with knee mobility and continued progress towards therapy goals.    Eval: Patient is a 68 y.o. female who was seen today for physical therapy evaluation and treatment for s/p Right TKA.  Patient demonstrates muscle weakness, reduced ROM, and fascial restrictions which are likely contributing to symptoms of pain and are negatively impacting patient ability to perform ADLs and functional mobility tasks. Patient will benefit from skilled physical therapy services to address these deficits to reduce pain and improve level of function with ADLs and functional mobility tasks.   OBJECTIVE IMPAIRMENTS: Abnormal gait, decreased activity tolerance, decreased mobility, difficulty walking, decreased ROM, decreased strength, increased fascial restrictions, and pain.   ACTIVITY LIMITATIONS: carrying, lifting, bending, sitting, standing, squatting, sleeping, stairs, transfers, bed mobility, locomotion level, and  caring for others  PARTICIPATION LIMITATIONS: meal prep, cleaning, laundry, driving, shopping, community activity, and yard work  [facility] POTENTIAL: Good  CLINICAL DECISION MAKING: Evolving/moderate complexity  EVALUATION COMPLEXITY: Moderate   GOALS: Goals reviewed with patient? Yes  SHORT TERM GOALS: Target date: 10/28/23 patient will be independent with initial HEP Baseline: Goal status: MET  2.  Patient will report 50% improvement overall Baseline: 12/13/23:  Reports improved by 75% Goal status: MET   3.  Patient will increase right knee flexion to 90 to prepare for step navigation Baseline: 32 Goal status: MET    LONG TERM GOALS: Target date: 11/22/23  Patient will be independent in self management strategies to improve quality of life and functional outcomes.  Baseline:  Goal status: MET  2.  Patient will report 75% improvement overall  Baseline: 12/13/23:  Reports improved by 75% Goal status: MET  3.  Patient will improve LEFS score by 10  points to demonstrate improved perceived function   Baseline: 8/80; 12/13/23: Lower Extremity Functional Score: 56 / 80 = 70.0 % Goal status: MET  4.   Patient will increase right leg MMT's to 5/5 to allow navigation of steps without gait deviation or loss of balance  Baseline: see above Goal status: in progress   5.  Patient will increase knee mobility to -2 to 120 to promote normal navigation of steps; step over step pattern  Baseline: see above;  Goal status: in progress   PLAN:  PT FREQUENCY: 2x/week  PT DURATION: 6 weeks  PLANNED INTERVENTIONS: 97164- PT Re-evaluation, 97110-Therapeutic exercises, 97530- Therapeutic activity, 97112- Neuromuscular re-education, 97535- Self Care, [id number]- Manual therapy, [phone]- Gait training, [phone]- Orthotic Fit/training, [phone]- Canalith repositioning, [id number]- Aquatic Therapy, [phone]- Splinting, Patient/Family education, Balance training, Stair training, Taping, Dry Needling, Joint  mobilization, Joint manipulation, Spinal manipulation, Spinal mobilization, Scar mobilization, and DME instructions.   PLAN FOR NEXT SESSION: hamstring strengthening, push past small amounts of pain   Lang Ada, PT, DPT [facility] Office: [phone] 9:34 AM, 12/27/23

## 2023-12-30 DIAGNOSIS — M1711 Unilateral primary osteoarthritis, right knee: Secondary | ICD-10-CM | POA: Diagnosis not present

## 2023-12-30 DIAGNOSIS — I10 Essential (primary) hypertension: Secondary | ICD-10-CM | POA: Diagnosis not present

## 2023-12-30 DIAGNOSIS — Z6827 Body mass index (BMI) 27.0-27.9, adult: Secondary | ICD-10-CM | POA: Diagnosis not present

## 2023-12-30 DIAGNOSIS — R002 Palpitations: Secondary | ICD-10-CM | POA: Diagnosis not present

## 2023-12-30 DIAGNOSIS — Z853 Personal history of malignant neoplasm of breast: Secondary | ICD-10-CM | POA: Diagnosis not present

## 2024-01-01 ENCOUNTER — Encounter (HOSPITAL_COMMUNITY): Payer: Self-pay

## 2024-01-01 ENCOUNTER — Ambulatory Visit (HOSPITAL_COMMUNITY)

## 2024-01-01 DIAGNOSIS — M6281 Muscle weakness (generalized): Secondary | ICD-10-CM

## 2024-01-01 DIAGNOSIS — M25561 Pain in right knee: Secondary | ICD-10-CM | POA: Diagnosis not present

## 2024-01-01 DIAGNOSIS — M25661 Stiffness of right knee, not elsewhere classified: Secondary | ICD-10-CM

## 2024-01-01 DIAGNOSIS — R262 Difficulty in walking, not elsewhere classified: Secondary | ICD-10-CM

## 2024-01-01 NOTE — Therapy (Signed)
 OUTPATIENT PHYSICAL THERAPY LOWER EXTREMITY TREATMENT   Patient Name: Amanda Roberts MRN: [id number] DOB:1956/04/09, 68 y.o., female Today's Date: 01/01/2024  END OF SESSION:  PT End of Session - 01/01/24 1426     Visit Number 25    Number of Visits 29    Date for PT Re-Evaluation 01/14/24    Authorization Type [employer]    Authorization Time Period no auth; no limit    Progress Note Due on Visit 29    PT Start Time 1426    PT Stop Time 1513    PT Time Calculation (min) 47 min    Activity Tolerance Patient tolerated treatment well    Behavior During Therapy WFL for tasks assessed/performed             Past Medical History:  Diagnosis Date   Asthma    Hypertension    PONV (postoperative nausea and vomiting)    Past Surgical History:  Procedure Laterality Date   ABDOMINAL HYSTERECTOMY     BREAST EXCISIONAL BIOPSY     BREAST LUMPECTOMY WITH RADIOACTIVE SEED LOCALIZATION Left 10/23/2019   Procedure: LEFT BREAST LUMPECTOMY WITH RADIOACTIVE SEED LOCALIZATION;  Surgeon: Curvin Deward MOULD, MD;  Location: [facility];  Service: General;  Laterality: Left;   CESAREAN SECTION     DG GALL BLADDER     FOOT SURGERY     Patient Active Problem List   Diagnosis Date Noted   At high risk for breast cancer 03/03/2020   Atypical ductal hyperplasia of left breast 11/27/2019   Essential hypertension 04/05/2017   Chest pain 03/06/2017   Family history of heart disease 03/06/2017    PCP: Jolee Elsie RAMAN, PA  REFERRING PROVIDER: Beverley Evalene BIRCH, MD  REFERRING DIAG: Rt TKR on 10/03/2023  THERAPY DIAG:  Right knee pain, unspecified chronicity  Difficulty in walking, not elsewhere classified  Muscle weakness (generalized)  Stiffness of right knee, not elsewhere classified  Rationale for Evaluation and Treatment: Rehabilitation  ONSET DATE: s/p 11/13/23  SUBJECTIVE:   SUBJECTIVE STATEMENT: 01/01/24:  Pt stated she went to work without compression  garments, stated increased swelling and noticed a limp, she put them on at 10 this morning with improved gait following.    EVAL:S/p right TKA 10/03/23 per Dr. Beverley; arrives with RW and waterproof bandage in place; TED hose  PERTINENT HISTORY: Here previous for knee pain PAIN:  Are you having pain? No  PRECAUTIONS: None  RED FLAGS: None   WEIGHT BEARING RESTRICTIONS: No  FALLS:  Has patient fallen in last 6 months? Yes. Number of falls 1 prior to surgery   OCCUPATION: retired but works some at [employer]  PLOF: Independent  PATIENT GOALS: walk normally without walker  NEXT MD VISIT: 12/11/23  OBJECTIVE:  Note: Objective measures were completed at Evaluation unless otherwise noted.  DIAGNOSTIC FINDINGS: none  PATIENT SURVEYS:  LEFS next visit 10/10/23 LEFS 8/80 10%  11/07/23 LEFS 26/80 32.5%  12/13/23: Lower Extremity Functional Score: 56 / 80 = 70.0 %   COGNITION: Overall cognitive status: Within functional limits for tasks assessed     SENSATION: WFL  EDEMA:  Normal for this time s/p   PALPATION: General soreness  LOWER EXTREMITY ROM: very guarded  Active ROM Right eval Left eval Right 10/10/23 Right 10/15/23 Right  10/17/23 Right  10/22/23 Right 11/01/23 Right 11/05/23: Right 11/14/23 Right 11/27/23 Right 11/29/23 Right 12/10/23 Right 12/13/23 Right 12/25/2023 Right 01/01/24  Hip flexion  Hip extension                 Hip abduction                 Hip adduction                 Hip internal rotation                 Hip external rotation                 Knee flexion 32  65 AAROM 74 AAROM 75 AAROM 72 90 90 AROM 90 degrees, AROM 98 degrees 110 113 110 AROM 115 PROM AROM 113 PROM 115  AROM 115 AROM 123  Knee extension -15   -8 -10 -8 -5 -5  -3 5 from neutral 5 from neutral 5 from neautral 3 from 0 4  Ankle dorsiflexion Decreased versus left                Ankle plantarflexion                 Ankle inversion                 Ankle  eversion                  (Blank rows = not tested)  LOWER EXTREMITY MMT:  MMT Right eval Right 11/07/23 Right 12/13/23 Right 12/25/2023  Hip flexion 2 4 4+ 4+  Hip extension   3+ 4  Hip abduction   4- 4  Hip adduction      Hip internal rotation      Hip external rotation      Knee flexion   4- 4-  Knee extension 2 (poor quad set) 3- 4- 4-  Ankle dorsiflexion      Ankle plantarflexion      Ankle inversion      Ankle eversion       (Blank rows = not tested)  FUNCTIONAL TESTS:  Evaluation 5 times sit to stand: 34.98 sec using hands to assist and reliant on left leg 2 minute walk test: 32 ft with RW 11/07/23  5X STS no UE (leaning to Lt) 12.38 (was 34.98 using UE)  125' with RW (was 32 ft) with SPC 40' in 2 minutes 11/29/23: 362 ft no AD 12/13/23: 321ft no AD  5X STS no UE  (equal weight bearing): 13.21; 8.64   GAIT: Distance walked: 32 ft Assistive device utilized: Environmental consultant - 2 wheeled Level of assistance: SBA Comments: slow pace; step to gait                                                                                                                                TREATMENT DATE: 12/31/23: -Stationary bike, 5 minutes, seat 6 full revolution -Sitting on stool horsie down hallway forward and backwards 1RT down long hallway -Sitting front  of treadmill hamstring curls 1'x 3 reps -Leg press 4Pl 3x 12 reps power out and eccentric control following. -Hamstring machine 4Pl 3x 12 reps  -Stairs ascending 7in reciprocal pattern 1 HR, 4in descending 5RT  -Hamstring stretches 3x 30 on 12in step height -Knee drive 5x 10 87pw step height -Posterior lunge over blue foam (no kneeling with Rt knee) -Slant board 3x 30  Supine: Heel slide 10x AROM 4-123 degrees  12/27/2023  Therapeutic Exercise: -Stationary bike, 5 minutes, seat 6, unable to power bike -Knee drive/extension stretch, 1 set 8 reps, 10 second holds, pt cued for increased OP with RUE on both  stretches -Ankle weight marches/buttkicks, 20 foot line, 3 laps, 3 pound ankle weights -3lb ankle weight walks over 9 inch hurdles, pt requires SBA -Forward lunges onto bosu ball, 2 sets of 6 reps better performance going into LLE, pt cued for core activation and upright posture, pt requires BUE support  Therapeutic Activity: -Sled pushes, 40 lbs, 50 foot laps, 2 laps, pt cued for smaller step and continuous movement of sled -Step up and overs, 2 sets of 7 reps, pt cued for decreased UE support -Lateral step up and overs, 2 sets of 5 reps bilaterally, pt cued for decreased UE support  12/25/2023  -2x10 staggered sit/stand with cues for proper pelvic posture -2x12 7in step ups with no UE assist -Quadruped rocks 6x10'' -Prone knee hang x 2' -Prone knee flexion with 3lb ankle weight x 12, w/ 5lb ankle weight x 12, w/ 7.5lb -ROM -MMT  12/18/23 TE - Bike full revolution x6 min seat 7 - Step stretch 20 repetitions (5s) - SLR with quad activation holds 20 reps Manual - passive extension in supine 10x10'' - passive knee flexion in supine with therapist assisting over arm/towel 10x10 TA - Squat to stand with stagger stance of R LE posteriorly and L LE in front for improving functional flexion tolerance to end range - Education on continuing to perform HEP as tolerated and icing management - Side step ups bilaterally with min-no support   PATIENT EDUCATION:  Education details: Patient educated on exam findings, POC, scope of PT, HEP, and what to expect next visit. Person educated: Patient Education method: Explanation, Demonstration, and Handouts Education comprehension: verbalized understanding, returned demonstration, verbal cues required, and tactile cues required  HOME EXERCISE PROGRAM: 12/13/23:   Begin walking program  12/04/23: - Heel prop with weight on thigh  11/12/23: - Prone Knee Extension Hang  - 2 x daily - 7 x weekly - 1 sets - 1 reps - 2'+ hold  11/05/23: -  desensitization techniques on incision - TKE with ball against wall.  11/01/23 - Standing Hamstring Stretch with Step  - 2 x daily - 7 x weekly - 1 sets - 5 reps - 20 hold - knee bends on the step using rails to hold onto  - 2 x daily - 7 x weekly - 3 sets - 10 reps - 5 sec hold  Access Code: [email or web address] URL: [email or web address] Date: 10/07/2023 Prepared by: [facility] - Rehab  Exercises - Supine Ankle Pumps  - 2-3 x daily - 7 x weekly - 2 sets - 10-20 reps - Supine Heel Slide  - 2-3 x daily - 7 x weekly - 3 sets - 10-20 reps - 5 hold - Seated Knee Flexion  - 2-3 x daily - 7 x weekly - 3 sets - 10-20 reps - 10 hold - Seated Long Arc Quad  - 2-3 x daily - 7 x  weekly - 3 sets - 10-20 reps - Supine Quad Set  - 2-3 x daily - 7 x weekly - 3 sets - 10 reps - Supine Heel Slide with Strap  - 2-3 x daily - 7 x weekly - 3 sets - 10 reps - Seated Knee Extension AAROM  - 2-3 x daily - 7 x weekly - 3 sets - 10 reps - Supine Knee Extension Stretch on Towel Roll  - 2-3 x daily - 7 x weekly - 3 sets - 10 reps  ASSESSMENT:  CLINICAL IMPRESSION: 01/01/24:  Session focus with knee mobility and with increased focus on specific hamstring and functional strengthening.  Pt continues to demonstrate decreased Rt LE strength, limited ROM and decreased gait quality.  Added hamstring strengthening machine with some cueing for controlled movements.  Stairs are difficult for pt, cueing required to reduce pushing off opposite LE and to reduce UE support pulling up as well as reduce rotation due to knee mobility and quad weakness.  Stretches complete for mobility.  EOS pt presents with improved AROM 4-123 degrees.  Cueing to improve arm swing to normalize gait mechanics.  No reports of pain through session, just tightness.   Eval: Patient is a 68 y.o. female who was seen today for physical therapy evaluation and treatment for s/p Right TKA.  Patient demonstrates muscle weakness, reduced ROM, and fascial restrictions  which are likely contributing to symptoms of pain and are negatively impacting patient ability to perform ADLs and functional mobility tasks. Patient will benefit from skilled physical therapy services to address these deficits to reduce pain and improve level of function with ADLs and functional mobility tasks.   OBJECTIVE IMPAIRMENTS: Abnormal gait, decreased activity tolerance, decreased mobility, difficulty walking, decreased ROM, decreased strength, increased fascial restrictions, and pain.   ACTIVITY LIMITATIONS: carrying, lifting, bending, sitting, standing, squatting, sleeping, stairs, transfers, bed mobility, locomotion level, and caring for others  PARTICIPATION LIMITATIONS: meal prep, cleaning, laundry, driving, shopping, community activity, and yard work  [facility] POTENTIAL: Good  CLINICAL DECISION MAKING: Evolving/moderate complexity  EVALUATION COMPLEXITY: Moderate   GOALS: Goals reviewed with patient? Yes  SHORT TERM GOALS: Target date: 10/28/23 patient will be independent with initial HEP Baseline: Goal status: MET  2.  Patient will report 50% improvement overall Baseline: 12/13/23:  Reports improved by 75% Goal status: MET   3.  Patient will increase right knee flexion to 90 to prepare for step navigation Baseline: 32 Goal status: MET    LONG TERM GOALS: Target date: 11/22/23  Patient will be independent in self management strategies to improve quality of life and functional outcomes.  Baseline:  Goal status: MET  2.  Patient will report 75% improvement overall  Baseline: 12/13/23:  Reports improved by 75% Goal status: MET  3.  Patient will improve LEFS score by 10  points to demonstrate improved perceived function   Baseline: 8/80; 12/13/23: Lower Extremity Functional Score: 56 / 80 = 70.0 % Goal status: MET  4.   Patient will increase right leg MMT's to 5/5 to allow navigation of steps without gait deviation or loss of balance  Baseline: see  above Goal status: in progress   5.  Patient will increase knee mobility to -2 to 120 to promote normal navigation of steps; step over step pattern  Baseline: see above;  Goal status: in progress   PLAN:  PT FREQUENCY: 2x/week  PT DURATION: 6 weeks  PLANNED INTERVENTIONS: 97164- PT Re-evaluation, 97110-Therapeutic exercises, 97530- Therapeutic activity, [id number]- Neuromuscular  re-education, [phone]- Self Care, [id number]- Manual therapy, [phone]- Gait training, [phone]- Orthotic Fit/training, [phone]- Canalith repositioning, [id number]- Aquatic Therapy, [phone]- Splinting, Patient/Family education, Balance training, Stair training, Taping, Dry Needling, Joint mobilization, Joint manipulation, Spinal manipulation, Spinal mobilization, Scar mobilization, and DME instructions.   PLAN FOR NEXT SESSION: hamstring strengthening, push past small amounts of pain  Augustin Mclean, LPTA/CLT; CBIS [phone]  3:25 PM, 01/01/24

## 2024-01-03 ENCOUNTER — Encounter (HOSPITAL_COMMUNITY): Payer: Self-pay

## 2024-01-03 ENCOUNTER — Ambulatory Visit (HOSPITAL_COMMUNITY)

## 2024-01-03 DIAGNOSIS — R262 Difficulty in walking, not elsewhere classified: Secondary | ICD-10-CM

## 2024-01-03 DIAGNOSIS — M25661 Stiffness of right knee, not elsewhere classified: Secondary | ICD-10-CM

## 2024-01-03 DIAGNOSIS — M25561 Pain in right knee: Secondary | ICD-10-CM

## 2024-01-03 DIAGNOSIS — M6281 Muscle weakness (generalized): Secondary | ICD-10-CM

## 2024-01-03 NOTE — Therapy (Signed)
 OUTPATIENT PHYSICAL THERAPY LOWER EXTREMITY TREATMENT   Patient Name: Amanda Roberts MRN: [id number] DOB:December 21, 1955, 68 y.o., female Today's Date: 01/03/2024  END OF SESSION:  PT End of Session - 01/03/24 1430     Visit Number 26    Number of Visits 29    Date for PT Re-Evaluation 01/14/24    Authorization Type [employer]    Authorization Time Period no auth; no limit    Progress Note Due on Visit 29    PT Start Time 1430    PT Stop Time 1510    PT Time Calculation (min) 40 min    Equipment Utilized During Treatment Gait belt    Activity Tolerance Patient tolerated treatment well    Behavior During Therapy WFL for tasks assessed/performed              Past Medical History:  Diagnosis Date   Asthma    Hypertension    PONV (postoperative nausea and vomiting)    Past Surgical History:  Procedure Laterality Date   ABDOMINAL HYSTERECTOMY     BREAST EXCISIONAL BIOPSY     BREAST LUMPECTOMY WITH RADIOACTIVE SEED LOCALIZATION Left 10/23/2019   Procedure: LEFT BREAST LUMPECTOMY WITH RADIOACTIVE SEED LOCALIZATION;  Surgeon: Curvin Deward MOULD, MD;  Location: [facility];  Service: General;  Laterality: Left;   CESAREAN SECTION     DG GALL BLADDER     FOOT SURGERY     Patient Active Problem List   Diagnosis Date Noted   At high risk for breast cancer 03/03/2020   Atypical ductal hyperplasia of left breast 11/27/2019   Essential hypertension 04/05/2017   Chest pain 03/06/2017   Family history of heart disease 03/06/2017    PCP: Jolee Elsie RAMAN, PA  REFERRING PROVIDER: Beverley Evalene BIRCH, MD  REFERRING DIAG: Rt TKR on 10/03/2023  THERAPY DIAG:  Right knee pain, unspecified chronicity  Difficulty in walking, not elsewhere classified  Muscle weakness (generalized)  Stiffness of right knee, not elsewhere classified  Rationale for Evaluation and Treatment: Rehabilitation  ONSET DATE: s/p 11/13/23  SUBJECTIVE:   SUBJECTIVE STATEMENT: Pt  states she has been sore since last session, 5/10. Pt states she has noticed the RLE and LLE has been swelling, PCP made was made aware.    EVAL:S/p right TKA 10/03/23 per Dr. Beverley; arrives with RW and waterproof bandage in place; TED hose  PERTINENT HISTORY: Here previous for knee pain PAIN:  Are you having pain? No  PRECAUTIONS: None  RED FLAGS: None   WEIGHT BEARING RESTRICTIONS: No  FALLS:  Has patient fallen in last 6 months? Yes. Number of falls 1 prior to surgery   OCCUPATION: retired but works some at [employer]  PLOF: Independent  PATIENT GOALS: walk normally without walker  NEXT MD VISIT: 12/11/23  OBJECTIVE:  Note: Objective measures were completed at Evaluation unless otherwise noted.  DIAGNOSTIC FINDINGS: none  PATIENT SURVEYS:  LEFS next visit 10/10/23 LEFS 8/80 10%  11/07/23 LEFS 26/80 32.5%  12/13/23: Lower Extremity Functional Score: 56 / 80 = 70.0 %   COGNITION: Overall cognitive status: Within functional limits for tasks assessed     SENSATION: WFL  EDEMA:  Normal for this time s/p   PALPATION: General soreness  LOWER EXTREMITY ROM: very guarded  Active ROM Right eval Left eval Right 10/10/23 Right 10/15/23 Right  10/17/23 Right  10/22/23 Right 11/01/23 Right 11/05/23: Right 11/14/23 Right 11/27/23 Right 11/29/23 Right 12/10/23 Right 12/13/23 Right 12/25/2023 Right 01/01/24  Hip  flexion                 Hip extension                 Hip abduction                 Hip adduction                 Hip internal rotation                 Hip external rotation                 Knee flexion 32  65 AAROM 74 AAROM 75 AAROM 72 90 90 AROM 90 degrees, AROM 98 degrees 110 113 110 AROM 115 PROM AROM 113 PROM 115  AROM 115 AROM 123  Knee extension -15   -8 -10 -8 -5 -5  -3 5 from neutral 5 from neutral 5 from neautral 3 from 0 4  Ankle dorsiflexion Decreased versus left                Ankle plantarflexion                 Ankle inversion                  Ankle eversion                  (Blank rows = not tested)  LOWER EXTREMITY MMT:  MMT Right eval Right 11/07/23 Right 12/13/23 Right 12/25/2023  Hip flexion 2 4 4+ 4+  Hip extension   3+ 4  Hip abduction   4- 4  Hip adduction      Hip internal rotation      Hip external rotation      Knee flexion   4- 4-  Knee extension 2 (poor quad set) 3- 4- 4-  Ankle dorsiflexion      Ankle plantarflexion      Ankle inversion      Ankle eversion       (Blank rows = not tested)  FUNCTIONAL TESTS:  Evaluation 5 times sit to stand: 34.98 sec using hands to assist and reliant on left leg 2 minute walk test: 32 ft with RW 11/07/23  5X STS no UE (leaning to Lt) 12.38 (was 34.98 using UE)  125' with RW (was 32 ft) with SPC 40' in 2 minutes 11/29/23: 362 ft no AD 12/13/23: 360ft no AD  5X STS no UE  (equal weight bearing): 13.21; 8.64   GAIT: Distance walked: 32 ft Assistive device utilized: Walker - 2 wheeled Level of assistance: SBA Comments: slow pace; step to gait                                                                                                                                TREATMENT DATE: 01/03/2024  Therapeutic Exercise: -Stationary bike, 5  minutes, seat 4, unable to power bike -Knee drive/extension stretch, 1 set 8 reps, 10 second holds, pt cued for increased OP with RUE on both stretches -Leg Press, 2 sets of 10 reps of 5 plates -Wall sits on door, 2 sets of 30 seconds -Forward lunges onto bosu ball, 2 sets of 8 reps first set, 6 reps second set, better performance going into LLE, pt cued for core activation and upright posture, pt cued for decreased UE support -Heel raises, 2 sets of 20 reps, no UE support second set -[place], fwd/bwd, 2 bout each direction, 30 second bouts -TKE, RTB, 2 sets of 10,  -Prone knee flexion stretch, 2 bouts of 30 seconds   12/31/23: -Stationary bike, 5 minutes, seat 6 full revolution -Sitting on stool horsie  down hallway forward and backwards 1RT down long hallway -Sitting front of treadmill hamstring curls 1'x 3 reps -Leg press 4Pl 3x 12 reps power out and eccentric control following. -Hamstring machine 4Pl 3x 12 reps  -Stairs ascending 7in reciprocal pattern 1 HR, 4in descending 5RT  -Hamstring stretches 3x 30 on 12in step height -Knee drive 5x 10 87pw step height -Posterior lunge over blue foam (no kneeling with Rt knee) -Slant board 3x 30  Supine: Heel slide 10x AROM 4-123 degrees  12/27/2023  Therapeutic Exercise: -Stationary bike, 5 minutes, seat 6, unable to power bike -Knee drive/extension stretch, 1 set 8 reps, 10 second holds, pt cued for increased OP with RUE on both stretches -Ankle weight marches/buttkicks, 20 foot line, 3 laps, 3 pound ankle weights -3lb ankle weight walks over 9 inch hurdles, pt requires SBA -Forward lunges onto bosu ball, 2 sets of 6 reps better performance going into LLE, pt cued for core activation and upright posture, pt requires BUE support  Therapeutic Activity: -Sled pushes, 40 lbs, 50 foot laps, 2 laps, pt cued for smaller step and continuous movement of sled -Step up and overs, 2 sets of 7 reps, pt cued for decreased UE support -Lateral step up and overs, 2 sets of 5 reps bilaterally, pt cued for decreased UE support  PATIENT EDUCATION:  Education details: Patient educated on exam findings, POC, scope of PT, HEP, and what to expect next visit. Person educated: Patient Education method: Explanation, Demonstration, and Handouts Education comprehension: verbalized understanding, returned demonstration, verbal cues required, and tactile cues required  HOME EXERCISE PROGRAM: 12/13/23:   Begin walking program  12/04/23: - Heel prop with weight on thigh  11/12/23: - Prone Knee Extension Hang  - 2 x daily - 7 x weekly - 1 sets - 1 reps - 2'+ hold  11/05/23: - desensitization techniques on incision - TKE with ball against wall.  11/01/23 -  Standing Hamstring Stretch with Step  - 2 x daily - 7 x weekly - 1 sets - 5 reps - 20 hold - knee bends on the step using rails to hold onto  - 2 x daily - 7 x weekly - 3 sets - 10 reps - 5 sec hold  Access Code: [email or web address] URL: [email or web address] Date: 10/07/2023 Prepared by: [facility] - Rehab  Exercises - Supine Ankle Pumps  - 2-3 x daily - 7 x weekly - 2 sets - 10-20 reps - Supine Heel Slide  - 2-3 x daily - 7 x weekly - 3 sets - 10-20 reps - 5 hold - Seated Knee Flexion  - 2-3 x daily - 7 x weekly - 3 sets - 10-20 reps - 10 hold - Seated Long Arc Quad  -  2-3 x daily - 7 x weekly - 3 sets - 10-20 reps - Supine Quad Set  - 2-3 x daily - 7 x weekly - 3 sets - 10 reps - Supine Heel Slide with Strap  - 2-3 x daily - 7 x weekly - 3 sets - 10 reps - Seated Knee Extension AAROM  - 2-3 x daily - 7 x weekly - 3 sets - 10 reps - Supine Knee Extension Stretch on Towel Roll  - 2-3 x daily - 7 x weekly - 3 sets - 10 reps  ASSESSMENT:  CLINICAL IMPRESSION: Patient continues to demonstrate decreased RLE strength/ROM, decreased gait quality and balance. Patient also demonstrates increased pain with knee flexion and returning from max end ranges. Patient able to progress dynamic balance and core activation exercises today with lunge variations with decreased UE support and treadmill variations, good performance with verbal cueing. Patient would continue to benefit from skilled physical therapy for increased endurance with ambulation, increased LE strength/ROM, and improved balance for improved quality of life, improved independence with community ambulation and continued progress towards therapy goals.   Eval: Patient is a 68 y.o. female who was seen today for physical therapy evaluation and treatment for s/p Right TKA.  Patient demonstrates muscle weakness, reduced ROM, and fascial restrictions which are likely contributing to symptoms of pain and are negatively impacting patient ability to  perform ADLs and functional mobility tasks. Patient will benefit from skilled physical therapy services to address these deficits to reduce pain and improve level of function with ADLs and functional mobility tasks.   OBJECTIVE IMPAIRMENTS: Abnormal gait, decreased activity tolerance, decreased mobility, difficulty walking, decreased ROM, decreased strength, increased fascial restrictions, and pain.   ACTIVITY LIMITATIONS: carrying, lifting, bending, sitting, standing, squatting, sleeping, stairs, transfers, bed mobility, locomotion level, and caring for others  PARTICIPATION LIMITATIONS: meal prep, cleaning, laundry, driving, shopping, community activity, and yard work  [facility] POTENTIAL: Good  CLINICAL DECISION MAKING: Evolving/moderate complexity  EVALUATION COMPLEXITY: Moderate   GOALS: Goals reviewed with patient? Yes  SHORT TERM GOALS: Target date: 10/28/23 patient will be independent with initial HEP Baseline: Goal status: MET  2.  Patient will report 50% improvement overall Baseline: 12/13/23:  Reports improved by 75% Goal status: MET   3.  Patient will increase right knee flexion to 90 to prepare for step navigation Baseline: 32 Goal status: MET    LONG TERM GOALS: Target date: 11/22/23  Patient will be independent in self management strategies to improve quality of life and functional outcomes.  Baseline:  Goal status: MET  2.  Patient will report 75% improvement overall  Baseline: 12/13/23:  Reports improved by 75% Goal status: MET  3.  Patient will improve LEFS score by 10  points to demonstrate improved perceived function   Baseline: 8/80; 12/13/23: Lower Extremity Functional Score: 56 / 80 = 70.0 % Goal status: MET  4.   Patient will increase right leg MMT's to 5/5 to allow navigation of steps without gait deviation or loss of balance  Baseline: see above Goal status: in progress   5.  Patient will increase knee mobility to -2 to 120 to promote normal  navigation of steps; step over step pattern  Baseline: see above;  Goal status: in progress   PLAN:  PT FREQUENCY: 2x/week  PT DURATION: 6 weeks  PLANNED INTERVENTIONS: 97164- PT Re-evaluation, 97110-Therapeutic exercises, 97530- Therapeutic activity, 97112- Neuromuscular re-education, 97535- Self Care, 02859- Manual therapy, [id number]- Gait training, [phone]- Orthotic Fit/training, [phone]- Canalith  repositioning, 02886- Aquatic Therapy, [phone]- Splinting, Patient/Family education, Balance training, Stair training, Taping, Dry Needling, Joint mobilization, Joint manipulation, Spinal manipulation, Spinal mobilization, Scar mobilization, and DME instructions.   PLAN FOR NEXT SESSION: hamstring strengthening, push past small amounts of pain  Lang Ada, PT, DPT [facility] Office: [phone] 3:13 PM, 01/03/24

## 2024-01-07 ENCOUNTER — Ambulatory Visit (HOSPITAL_COMMUNITY)

## 2024-01-07 ENCOUNTER — Encounter (HOSPITAL_COMMUNITY): Payer: Self-pay

## 2024-01-07 DIAGNOSIS — M25561 Pain in right knee: Secondary | ICD-10-CM | POA: Diagnosis not present

## 2024-01-07 DIAGNOSIS — M25661 Stiffness of right knee, not elsewhere classified: Secondary | ICD-10-CM

## 2024-01-07 DIAGNOSIS — M6281 Muscle weakness (generalized): Secondary | ICD-10-CM

## 2024-01-07 DIAGNOSIS — R262 Difficulty in walking, not elsewhere classified: Secondary | ICD-10-CM

## 2024-01-07 NOTE — Therapy (Signed)
 OUTPATIENT PHYSICAL THERAPY LOWER EXTREMITY TREATMENT   Patient Name: Amanda Roberts MRN: [id number] DOB:1956-04-01, 68 y.o., female Today's Date: 01/07/2024  END OF SESSION:  PT End of Session - 01/07/24 1347     Visit Number 27    Number of Visits 29    Date for PT Re-Evaluation 01/14/24    Authorization Type [employer]    Authorization Time Period no auth; no limit    Progress Note Due on Visit 29    PT Start Time 1347    PT Stop Time 1432    PT Time Calculation (min) 45 min    Activity Tolerance Patient tolerated treatment well    Behavior During Therapy WFL for tasks assessed/performed              Past Medical History:  Diagnosis Date   Asthma    Hypertension    PONV (postoperative nausea and vomiting)    Past Surgical History:  Procedure Laterality Date   ABDOMINAL HYSTERECTOMY     BREAST EXCISIONAL BIOPSY     BREAST LUMPECTOMY WITH RADIOACTIVE SEED LOCALIZATION Left 10/23/2019   Procedure: LEFT BREAST LUMPECTOMY WITH RADIOACTIVE SEED LOCALIZATION;  Surgeon: Curvin Deward MOULD, MD;  Location: [facility];  Service: General;  Laterality: Left;   CESAREAN SECTION     DG GALL BLADDER     FOOT SURGERY     Patient Active Problem List   Diagnosis Date Noted   At high risk for breast cancer 03/03/2020   Atypical ductal hyperplasia of left breast 11/27/2019   Essential hypertension 04/05/2017   Chest pain 03/06/2017   Family history of heart disease 03/06/2017    PCP: Jolee Elsie RAMAN, PA  REFERRING PROVIDER: Beverley Evalene BIRCH, MD  REFERRING DIAG: Rt TKR on 10/03/2023  THERAPY DIAG:  Right knee pain, unspecified chronicity  Difficulty in walking, not elsewhere classified  Muscle weakness (generalized)  Stiffness of right knee, not elsewhere classified  Rationale for Evaluation and Treatment: Rehabilitation  ONSET DATE: s/p 11/13/23  SUBJECTIVE:   SUBJECTIVE STATEMENT: Pt stated she has swelling on lateral aspect of knee,  wearing compression socks today.  No reports of pain today.  Her asthma is up today, difficulty breathing when out of shower.  Inhaler in her vehicle.  EVAL:S/p right TKA 10/03/23 per Dr. Beverley; arrives with RW and waterproof bandage in place; TED hose  PERTINENT HISTORY: Here previous for knee pain PAIN:  Are you having pain? No  PRECAUTIONS: None  RED FLAGS: None   WEIGHT BEARING RESTRICTIONS: No  FALLS:  Has patient fallen in last 6 months? Yes. Number of falls 1 prior to surgery   OCCUPATION: retired but works some at [employer]  PLOF: Independent  PATIENT GOALS: walk normally without walker  NEXT MD VISIT: 12/11/23  OBJECTIVE:  Note: Objective measures were completed at Evaluation unless otherwise noted.  DIAGNOSTIC FINDINGS: none  PATIENT SURVEYS:  LEFS next visit 10/10/23 LEFS 8/80 10%  11/07/23 LEFS 26/80 32.5%  12/13/23: Lower Extremity Functional Score: 56 / 80 = 70.0 %   COGNITION: Overall cognitive status: Within functional limits for tasks assessed     SENSATION: WFL  EDEMA:  Normal for this time s/p   PALPATION: General soreness  LOWER EXTREMITY ROM: very guarded  Active ROM Right eval Left eval Right 10/10/23 Right 10/15/23 Right  10/17/23 Right  10/22/23 Right 11/01/23 Right 11/05/23: Right 11/14/23 Right 11/27/23 Right 11/29/23 Right 12/10/23 Right 12/13/23 Right 12/25/2023 Right 01/01/24  Hip flexion  Hip extension                 Hip abduction                 Hip adduction                 Hip internal rotation                 Hip external rotation                 Knee flexion 32  65 AAROM 74 AAROM 75 AAROM 72 90 90 AROM 90 degrees, AROM 98 degrees 110 113 110 AROM 115 PROM AROM 113 PROM 115  AROM 115 AROM 123  Knee extension -15   -8 -10 -8 -5 -5  -3 5 from neutral 5 from neutral 5 from neautral 3 from 0 4  Ankle dorsiflexion Decreased versus left                Ankle plantarflexion                 Ankle  inversion                 Ankle eversion                  (Blank rows = not tested)  LOWER EXTREMITY MMT:  MMT Right eval Right 11/07/23 Right 12/13/23 Right 12/25/2023  Hip flexion 2 4 4+ 4+  Hip extension   3+ 4  Hip abduction   4- 4  Hip adduction      Hip internal rotation      Hip external rotation      Knee flexion   4- 4-  Knee extension 2 (poor quad set) 3- 4- 4-  Ankle dorsiflexion      Ankle plantarflexion      Ankle inversion      Ankle eversion       (Blank rows = not tested)  FUNCTIONAL TESTS:  Evaluation 5 times sit to stand: 34.98 sec using hands to assist and reliant on left leg 2 minute walk test: 32 ft with RW 11/07/23  5X STS no UE (leaning to Lt) 12.38 (was 34.98 using UE)  125' with RW (was 32 ft) with SPC 40' in 2 minutes 11/29/23: 362 ft no AD 12/13/23: 342ft no AD  5X STS no UE  (equal weight bearing): 13.21; 8.64   GAIT: Distance walked: 32 ft Assistive device utilized: Environmental consultant - 2 wheeled Level of assistance: SBA Comments: slow pace; step to gait                                                                                                                                TREATMENT DATE: 01/07/24: Manual:  -decongestive techniques for edema control Prone: -Contract/relax 5x 10/ 30 -Quad stretch 3x 30 Supine: AROM: 4-125degrees Standing: -Retro gait  2' @ speed.6  -Squats 15x front of chair -Reciprocal pattern 7in step height 3RT 1 HR -Leg press 2x 10 5Pl -Stationary bike, 5 minutes, seat 5,    01/03/2024  Therapeutic Exercise: -Stationary bike, 5 minutes, seat 4, unable to power bike -Knee drive/extension stretch, 1 set 8 reps, 10 second holds, pt cued for increased OP with RUE on both stretches -Leg Press, 2 sets of 10 reps of 5 plates -Wall sits on door, 2 sets of 30 seconds -Forward lunges onto bosu ball, 2 sets of 8 reps first set, 6 reps second set, better performance going into LLE, pt cued for core  activation and upright posture, pt cued for decreased UE support -Heel raises, 2 sets of 20 reps, no UE support second set -Deadmills, fwd/bwd, 2 bout each direction, 30 second bouts -TKE, RTB, 2 sets of 10,  -Prone knee flexion stretch, 2 bouts of 30 seconds   12/31/23: -Stationary bike, 5 minutes, seat 6 full revolution -Sitting on stool horsie down hallway forward and backwards 1RT down long hallway -Sitting front of treadmill hamstring curls 1'x 3 reps -Leg press 4Pl 3x 12 reps power out and eccentric control following. -Hamstring machine 4Pl 3x 12 reps  -Stairs ascending 7in reciprocal pattern 1 HR, 4in descending 5RT  -Hamstring stretches 3x 30 on 12in step height -Knee drive 5x 10 87pw step height -Posterior lunge over blue foam (no kneeling with Rt knee) -Slant board 3x 30  Supine: Heel slide 10x AROM 4-123 degrees  12/27/2023  Therapeutic Exercise: -Stationary bike, 5 minutes, seat 6, unable to power bike -Knee drive/extension stretch, 1 set 8 reps, 10 second holds, pt cued for increased OP with RUE on both stretches -Ankle weight marches/buttkicks, 20 foot line, 3 laps, 3 pound ankle weights -3lb ankle weight walks over 9 inch hurdles, pt requires SBA -Forward lunges onto bosu ball, 2 sets of 6 reps better performance going into LLE, pt cued for core activation and upright posture, pt requires BUE support  Therapeutic Activity: -Sled pushes, 40 lbs, 50 foot laps, 2 laps, pt cued for smaller step and continuous movement of sled -Step up and overs, 2 sets of 7 reps, pt cued for decreased UE support -Lateral step up and overs, 2 sets of 5 reps bilaterally, pt cued for decreased UE support  PATIENT EDUCATION:  Education details: Patient educated on exam findings, POC, scope of PT, HEP, and what to expect next visit. Person educated: Patient Education method: Explanation, Demonstration, and Handouts Education comprehension: verbalized understanding, returned  demonstration, verbal cues required, and tactile cues required  HOME EXERCISE PROGRAM: 12/13/23:   Begin walking program  12/04/23: - Heel prop with weight on thigh  11/12/23: - Prone Knee Extension Hang  - 2 x daily - 7 x weekly - 1 sets - 1 reps - 2'+ hold  11/05/23: - desensitization techniques on incision - TKE with ball against wall.  11/01/23 - Standing Hamstring Stretch with Step  - 2 x daily - 7 x weekly - 1 sets - 5 reps - 20 hold - knee bends on the step using rails to hold onto  - 2 x daily - 7 x weekly - 3 sets - 10 reps - 5 sec hold  Access Code: [email or web address] URL: [email or web address] Date: 10/07/2023 Prepared by: AP - Rehab  Exercises - Supine Ankle Pumps  - 2-3 x daily - 7 x weekly - 2 sets - 10-20 reps - Supine Heel Slide  - 2-3 x daily - 7 x  weekly - 3 sets - 10-20 reps - 5 hold - Seated Knee Flexion  - 2-3 x daily - 7 x weekly - 3 sets - 10-20 reps - 10 hold - Seated Long Arc Quad  - 2-3 x daily - 7 x weekly - 3 sets - 10-20 reps - Supine Quad Set  - 2-3 x daily - 7 x weekly - 3 sets - 10 reps - Supine Heel Slide with Strap  - 2-3 x daily - 7 x weekly - 3 sets - 10 reps - Seated Knee Extension AAROM  - 2-3 x daily - 7 x weekly - 3 sets - 10 reps - Supine Knee Extension Stretch on Towel Roll  - 2-3 x daily - 7 x weekly - 3 sets - 10 reps  ASSESSMENT:  CLINICAL IMPRESSION: Session focus with knee mobility and functional strengthening.  Began session with manual to address edema present proximal knee prior ROM based exercises.  Resumed contract relax with improved AROM 4-125 degrees.  Pt presents with improved control with ascending stairs following cueing to reduce hopping and reduce UE support with improved mechanics following.  Does continue to rotate some while descending with cueing to improve awareness.     Eval: Patient is a 68 y.o. female who was seen today for physical therapy evaluation and treatment for s/p Right TKA.  Patient demonstrates  muscle weakness, reduced ROM, and fascial restrictions which are likely contributing to symptoms of pain and are negatively impacting patient ability to perform ADLs and functional mobility tasks. Patient will benefit from skilled physical therapy services to address these deficits to reduce pain and improve level of function with ADLs and functional mobility tasks.   OBJECTIVE IMPAIRMENTS: Abnormal gait, decreased activity tolerance, decreased mobility, difficulty walking, decreased ROM, decreased strength, increased fascial restrictions, and pain.   ACTIVITY LIMITATIONS: carrying, lifting, bending, sitting, standing, squatting, sleeping, stairs, transfers, bed mobility, locomotion level, and caring for others  PARTICIPATION LIMITATIONS: meal prep, cleaning, laundry, driving, shopping, community activity, and yard work  [facility] POTENTIAL: Good  CLINICAL DECISION MAKING: Evolving/moderate complexity  EVALUATION COMPLEXITY: Moderate   GOALS: Goals reviewed with patient? Yes  SHORT TERM GOALS: Target date: 10/28/23 patient will be independent with initial HEP Baseline: Goal status: MET  2.  Patient will report 50% improvement overall Baseline: 12/13/23:  Reports improved by 75% Goal status: MET   3.  Patient will increase right knee flexion to 90 to prepare for step navigation Baseline: 32 Goal status: MET    LONG TERM GOALS: Target date: 11/22/23  Patient will be independent in self management strategies to improve quality of life and functional outcomes.  Baseline:  Goal status: MET  2.  Patient will report 75% improvement overall  Baseline: 12/13/23:  Reports improved by 75% Goal status: MET  3.  Patient will improve LEFS score by 10  points to demonstrate improved perceived function   Baseline: 8/80; 12/13/23: Lower Extremity Functional Score: 56 / 80 = 70.0 % Goal status: MET  4.   Patient will increase right leg MMT's to 5/5 to allow navigation of steps without gait  deviation or loss of balance  Baseline: see above Goal status: in progress   5.  Patient will increase knee mobility to -2 to 120 to promote normal navigation of steps; step over step pattern  Baseline: see above;  Goal status: in progress   PLAN:  PT FREQUENCY: 2x/week  PT DURATION: 6 weeks  PLANNED INTERVENTIONS: 97164- PT Re-evaluation, 97110-Therapeutic exercises,  02469- Therapeutic activity, [id number]- Neuromuscular re-education, [phone]- Self Care, 02859- Manual therapy, [phone]- Gait training, [phone]- Orthotic Fit/training, [phone]- Canalith repositioning, [id number]- Aquatic Therapy, [phone]- Splinting, Patient/Family education, Balance training, Stair training, Taping, Dry Needling, Joint mobilization, Joint manipulation, Spinal manipulation, Spinal mobilization, Scar mobilization, and DME instructions.   PLAN FOR NEXT SESSION: hamstring strengthening, push past small amounts of pain.  Augustin Mclean, LPTA/CLT; WILLAIM [phone]  2:35 PM, 01/07/24

## 2024-01-09 ENCOUNTER — Ambulatory Visit (HOSPITAL_COMMUNITY)

## 2024-01-09 ENCOUNTER — Encounter (HOSPITAL_COMMUNITY): Payer: Self-pay

## 2024-01-09 DIAGNOSIS — M25561 Pain in right knee: Secondary | ICD-10-CM

## 2024-01-09 DIAGNOSIS — R262 Difficulty in walking, not elsewhere classified: Secondary | ICD-10-CM

## 2024-01-09 DIAGNOSIS — M6281 Muscle weakness (generalized): Secondary | ICD-10-CM

## 2024-01-09 DIAGNOSIS — M25661 Stiffness of right knee, not elsewhere classified: Secondary | ICD-10-CM

## 2024-01-09 NOTE — Therapy (Signed)
 OUTPATIENT PHYSICAL THERAPY LOWER EXTREMITY TREATMENT   Patient Name: Amanda Roberts MRN: [id number] DOB:August 08, 1955, 68 y.o., female Today's Date: 01/09/2024  END OF SESSION:  PT End of Session - 01/09/24 1433     Visit Number 28    Number of Visits 29    Date for PT Re-Evaluation 01/14/24    Authorization Type [employer]    Authorization Time Period no auth; no limit    Progress Note Due on Visit 29    PT Start Time 1435    PT Stop Time 1521    PT Time Calculation (min) 46 min    Activity Tolerance Patient tolerated treatment well    Behavior During Therapy WFL for tasks assessed/performed              Past Medical History:  Diagnosis Date   Asthma    Hypertension    PONV (postoperative nausea and vomiting)    Past Surgical History:  Procedure Laterality Date   ABDOMINAL HYSTERECTOMY     BREAST EXCISIONAL BIOPSY     BREAST LUMPECTOMY WITH RADIOACTIVE SEED LOCALIZATION Left 10/23/2019   Procedure: LEFT BREAST LUMPECTOMY WITH RADIOACTIVE SEED LOCALIZATION;  Surgeon: Curvin Deward MOULD, MD;  Location: [facility];  Service: General;  Laterality: Left;   CESAREAN SECTION     DG GALL BLADDER     FOOT SURGERY     Patient Active Problem List   Diagnosis Date Noted   At high risk for breast cancer 03/03/2020   Atypical ductal hyperplasia of left breast 11/27/2019   Essential hypertension 04/05/2017   Chest pain 03/06/2017   Family history of heart disease 03/06/2017    PCP: Jolee Elsie RAMAN, PA  REFERRING PROVIDER: Beverley Evalene BIRCH, MD  REFERRING DIAG: Rt TKR on 10/03/2023  THERAPY DIAG:  Right knee pain, unspecified chronicity  Difficulty in walking, not elsewhere classified  Muscle weakness (generalized)  Stiffness of right knee, not elsewhere classified  Rationale for Evaluation and Treatment: Rehabilitation  ONSET DATE: s/p 11/13/23  SUBJECTIVE:   SUBJECTIVE STATEMENT: Feeling good today, went [place] [facility] [place],  walked for 30 minutes.  No reports of pain today.    EVAL:S/p right TKA 10/03/23 per Dr. Beverley; arrives with RW and waterproof bandage in place; TED hose  PERTINENT HISTORY: Here previous for knee pain PAIN:  Are you having pain? No  PRECAUTIONS: None  RED FLAGS: None   WEIGHT BEARING RESTRICTIONS: No  FALLS:  Has patient fallen in last 6 months? Yes. Number of falls 1 prior to surgery   OCCUPATION: retired but works some at [employer]  PLOF: Independent  PATIENT GOALS: walk normally without walker  NEXT MD VISIT: 12/11/23  OBJECTIVE:  Note: Objective measures were completed at Evaluation unless otherwise noted.  DIAGNOSTIC FINDINGS: none  PATIENT SURVEYS:  LEFS next visit 10/10/23 LEFS 8/80 10%  11/07/23 LEFS 26/80 32.5%  12/13/23: Lower Extremity Functional Score: 56 / 80 = 70.0 %   COGNITION: Overall cognitive status: Within functional limits for tasks assessed     SENSATION: WFL  EDEMA:  Normal for this time s/p   PALPATION: General soreness  LOWER EXTREMITY ROM: very guarded  Active ROM Right eval Left eval Right 10/10/23 Right 10/15/23 Right  10/17/23 Right  10/22/23 Right 11/01/23 Right 11/05/23: Right 11/14/23 Right 11/27/23 Right 11/29/23 Right 12/10/23 Right 12/13/23 Right 12/25/2023 Right 01/01/24  Hip flexion  Hip extension                 Hip abduction                 Hip adduction                 Hip internal rotation                 Hip external rotation                 Knee flexion 32  65 AAROM 74 AAROM 75 AAROM 72 90 90 AROM 90 degrees, AROM 98 degrees 110 113 110 AROM 115 PROM AROM 113 PROM 115  AROM 115 AROM 123  Knee extension -15   -8 -10 -8 -5 -5  -3 5 from neutral 5 from neutral 5 from neautral 3 from 0 4  Ankle dorsiflexion Decreased versus left                Ankle plantarflexion                 Ankle inversion                 Ankle eversion                  (Blank rows = not tested)  LOWER EXTREMITY  MMT:  MMT Right eval Right 11/07/23 Right 12/13/23 Right 12/25/2023  Hip flexion 2 4 4+ 4+  Hip extension   3+ 4  Hip abduction   4- 4  Hip adduction      Hip internal rotation      Hip external rotation      Knee flexion   4- 4-  Knee extension 2 (poor quad set) 3- 4- 4-  Ankle dorsiflexion      Ankle plantarflexion      Ankle inversion      Ankle eversion       (Blank rows = not tested)  FUNCTIONAL TESTS:  Evaluation 5 times sit to stand: 34.98 sec using hands to assist and reliant on left leg 2 minute walk test: 32 ft with RW 11/07/23  5X STS no UE (leaning to Lt) 12.38 (was 34.98 using UE)  125' with RW (was 32 ft) with SPC 40' in 2 minutes 11/29/23: 362 ft no AD 12/13/23: 366ft no AD  5X STS no UE  (equal weight bearing): 13.21; 8.64   GAIT: Distance walked: 32 ft Assistive device utilized: Walker - 2 wheeled Level of assistance: SBA Comments: slow pace; step to gait                                                                                                                                TREATMENT DATE: 01/09/24: - Bike seat 6 x 5' - [employer] TKE 3Pl Standing: - Stairs reciprocal 7in x 1RT (difficulty descending); 2RT 7in ascend, 4in  descending - Step down training with 6in step height 15x - Hamstring stretch 12in step - Lateral step up 6in step height 15x - Forward lunges 15x BLE - SLS Lt 10, Rt 23 - Leg press 5Pl 3x 10  Manual:  -Decongestive    01/07/24: Manual:  -decongestive techniques for edema control Prone: -Contract/relax 5x 10/ 30 -Quad stretch 3x 30 Supine: AROM: 4-125degrees Standing: -Retro gait  2' @ speed.6  -Squats 15x front of chair -Reciprocal pattern 7in step height 3RT 1 HR -Leg press 2x 10 5Pl -Stationary bike, 5 minutes, seat 5,    01/03/2024  Therapeutic Exercise: -Stationary bike, 5 minutes, seat 4, unable to power bike -Knee drive/extension stretch, 1 set 8 reps, 10 second holds, pt cued for  increased OP with RUE on both stretches -Leg Press, 2 sets of 10 reps of 5 plates -Wall sits on door, 2 sets of 30 seconds -Forward lunges onto bosu ball, 2 sets of 8 reps first set, 6 reps second set, better performance going into LLE, pt cued for core activation and upright posture, pt cued for decreased UE support -Heel raises, 2 sets of 20 reps, no UE support second set -Deadmills, fwd/bwd, 2 bout each direction, 30 second bouts -TKE, RTB, 2 sets of 10,  -Prone knee flexion stretch, 2 bouts of 30 seconds   PATIENT EDUCATION:  Education details: Patient educated on exam findings, POC, scope of PT, HEP, and what to expect next visit. Person educated: Patient Education method: Explanation, Demonstration, and Handouts Education comprehension: verbalized understanding, returned demonstration, verbal cues required, and tactile cues required  HOME EXERCISE PROGRAM: 12/13/23:   Begin walking program  12/04/23: - Heel prop with weight on thigh  11/12/23: - Prone Knee Extension Hang  - 2 x daily - 7 x weekly - 1 sets - 1 reps - 2'+ hold  11/05/23: - desensitization techniques on incision - TKE with ball against wall.  11/01/23 - Standing Hamstring Stretch with Step  - 2 x daily - 7 x weekly - 1 sets - 5 reps - 20 hold - knee bends on the step using rails to hold onto  - 2 x daily - 7 x weekly - 3 sets - 10 reps - 5 sec hold  Access Code: [email or web address] URL: [email or web address] Date: 10/07/2023 Prepared by: [facility] - Rehab  Exercises - Supine Ankle Pumps  - 2-3 x daily - 7 x weekly - 2 sets - 10-20 reps - Supine Heel Slide  - 2-3 x daily - 7 x weekly - 3 sets - 10-20 reps - 5 hold - Seated Knee Flexion  - 2-3 x daily - 7 x weekly - 3 sets - 10-20 reps - 10 hold - Seated Long Arc Quad  - 2-3 x daily - 7 x weekly - 3 sets - 10-20 reps - Supine Quad Set  - 2-3 x daily - 7 x weekly - 3 sets - 10 reps - Supine Heel Slide with Strap  - 2-3 x daily - 7 x weekly - 3 sets - 10 reps -  Seated Knee Extension AAROM  - 2-3 x daily - 7 x weekly - 3 sets - 10 reps - Supine Knee Extension Stretch on Towel Roll  - 2-3 x daily - 7 x weekly - 3 sets - 10 reps  ASSESSMENT:  CLINICAL IMPRESSION: Progressed functional strengthening and knee extension exercises/stretches.  Pt presents with increased ease ascending stairs though presents with decreased eccentric control.  Pt tolerated well to session  with no reports of pain through session with appropriate fatigue levels.  EOS with manual to address edema for proximal knee and STM to quadriceps.  AROM 4-125.  Pt to reassessed next session for probable discharge.   Eval: Patient is a 67 y.o. female who was seen today for physical therapy evaluation and treatment for s/p Right TKA.  Patient demonstrates muscle weakness, reduced ROM, and fascial restrictions which are likely contributing to symptoms of pain and are negatively impacting patient ability to perform ADLs and functional mobility tasks. Patient will benefit from skilled physical therapy services to address these deficits to reduce pain and improve level of function with ADLs and functional mobility tasks.   OBJECTIVE IMPAIRMENTS: Abnormal gait, decreased activity tolerance, decreased mobility, difficulty walking, decreased ROM, decreased strength, increased fascial restrictions, and pain.   ACTIVITY LIMITATIONS: carrying, lifting, bending, sitting, standing, squatting, sleeping, stairs, transfers, bed mobility, locomotion level, and caring for others  PARTICIPATION LIMITATIONS: meal prep, cleaning, laundry, driving, shopping, community activity, and yard work  [facility] POTENTIAL: Good  CLINICAL DECISION MAKING: Evolving/moderate complexity  EVALUATION COMPLEXITY: Moderate   GOALS: Goals reviewed with patient? Yes  SHORT TERM GOALS: Target date: 10/28/23 patient will be independent with initial HEP Baseline: Goal status: MET  2.  Patient will report 50% improvement  overall Baseline: 12/13/23:  Reports improved by 75% Goal status: MET   3.  Patient will increase right knee flexion to 90 to prepare for step navigation Baseline: 32 Goal status: MET    LONG TERM GOALS: Target date: 11/22/23  Patient will be independent in self management strategies to improve quality of life and functional outcomes.  Baseline:  Goal status: MET  2.  Patient will report 75% improvement overall  Baseline: 12/13/23:  Reports improved by 75% Goal status: MET  3.  Patient will improve LEFS score by 10  points to demonstrate improved perceived function   Baseline: 8/80; 12/13/23: Lower Extremity Functional Score: 56 / 80 = 70.0 % Goal status: MET  4.   Patient will increase right leg MMT's to 5/5 to allow navigation of steps without gait deviation or loss of balance  Baseline: see above Goal status: in progress   5.  Patient will increase knee mobility to -2 to 120 to promote normal navigation of steps; step over step pattern  Baseline: see above;  Goal status: in progress   PLAN:  PT FREQUENCY: 2x/week  PT DURATION: 6 weeks  PLANNED INTERVENTIONS: 97164- PT Re-evaluation, 97110-Therapeutic exercises, 97530- Therapeutic activity, 97112- Neuromuscular re-education, 97535- Self Care, 02859- Manual therapy, [phone]- Gait training, [phone]- Orthotic Fit/training, [phone]- Canalith repositioning, [id number]- Aquatic Therapy, [phone]- Splinting, Patient/Family education, Balance training, Stair training, Taping, Dry Needling, Joint mobilization, Joint manipulation, Spinal manipulation, Spinal mobilization, Scar mobilization, and DME instructions.   PLAN FOR NEXT SESSION: Reassess next session.  Augustin Mclean, LPTA/CLT; CBIS [phone]  3:44 PM, 01/09/24

## 2024-01-14 ENCOUNTER — Ambulatory Visit (HOSPITAL_COMMUNITY)

## 2024-01-14 ENCOUNTER — Encounter (HOSPITAL_COMMUNITY): Payer: Self-pay

## 2024-01-14 DIAGNOSIS — M25661 Stiffness of right knee, not elsewhere classified: Secondary | ICD-10-CM

## 2024-01-14 DIAGNOSIS — M25561 Pain in right knee: Secondary | ICD-10-CM | POA: Diagnosis not present

## 2024-01-14 DIAGNOSIS — M6281 Muscle weakness (generalized): Secondary | ICD-10-CM

## 2024-01-14 DIAGNOSIS — R262 Difficulty in walking, not elsewhere classified: Secondary | ICD-10-CM

## 2024-01-14 NOTE — Therapy (Signed)
 OUTPATIENT PHYSICAL THERAPY LOWER EXTREMITY TREATMENT/DISCHARGE  PHYSICAL THERAPY DISCHARGE SUMMARY  Visits from Start of Care: 29  Current functional level related to goals / functional outcomes: WFL   Remaining deficits: NONE   Education / Equipment: HEP and compliance   Patient agrees to discharge. Patient goals were met. Patient is being discharged due to meeting the stated rehab goals.   Patient Name: Amanda Roberts MRN: [id number] DOB:[birth year], 68 y.o., female Today's Date: 01/14/2024  END OF SESSION:  PT End of Session - 01/14/24 1346     Visit Number 29    Number of Visits 29    Date for PT Re-Evaluation 01/14/24    Authorization Type [employer]    Authorization Time Period no auth; no limit    Progress Note Due on Visit 29    PT Start Time 1346    PT Stop Time 1421    PT Time Calculation (min) 35 min    Equipment Utilized During Treatment Gait belt    Activity Tolerance Patient tolerated treatment well    Behavior During Therapy WFL for tasks assessed/performed               Past Medical History:  Diagnosis Date   Asthma    Hypertension    PONV (postoperative nausea and vomiting)    Past Surgical History:  Procedure Laterality Date   ABDOMINAL HYSTERECTOMY     BREAST EXCISIONAL BIOPSY     BREAST LUMPECTOMY WITH RADIOACTIVE SEED LOCALIZATION Left 10/23/2019   Procedure: LEFT BREAST LUMPECTOMY WITH RADIOACTIVE SEED LOCALIZATION;  Surgeon: Curvin Deward MOULD, MD;  Location: [facility];  Service: General;  Laterality: Left;   CESAREAN SECTION     DG GALL BLADDER     FOOT SURGERY     Patient Active Problem List   Diagnosis Date Noted   At high risk for breast cancer 03/03/2020   Atypical ductal hyperplasia of left breast 11/27/2019   Essential hypertension 04/05/2017   Chest pain 03/06/2017   Family history of heart disease 03/06/2017    PCP: Jolee Elsie RAMAN, PA  REFERRING PROVIDER: Beverley Evalene BIRCH, MD  REFERRING  DIAG: Rt TKR on 10/03/2023  THERAPY DIAG:  Right knee pain, unspecified chronicity  Difficulty in walking, not elsewhere classified  Muscle weakness (generalized)  Stiffness of right knee, not elsewhere classified  Rationale for Evaluation and Treatment: Rehabilitation  ONSET DATE: s/p 11/13/23  SUBJECTIVE:   SUBJECTIVE STATEMENT: Pt states she feels ready for discharge today. Pt states she is in no pain upon presentation. Pt states she plans on joining the [employer] to continue rehabilitation of knee.   EVAL:S/p right TKA 10/03/23 per Dr. Beverley; arrives with RW and waterproof bandage in place; TED hose  PERTINENT HISTORY: Here previous for knee pain PAIN:  Are you having pain? No  PRECAUTIONS: None  RED FLAGS: None   WEIGHT BEARING RESTRICTIONS: No  FALLS:  Has patient fallen in last 6 months? Yes. Number of falls 1 prior to surgery   OCCUPATION: retired but works some at [employer]  PLOF: Independent  PATIENT GOALS: walk normally without walker  NEXT MD VISIT: 12/11/23  OBJECTIVE:  Note: Objective measures were completed at Evaluation unless otherwise noted.  DIAGNOSTIC FINDINGS: none  PATIENT SURVEYS:  LEFS next visit 10/10/23 LEFS 8/80 10%  11/07/23 LEFS 26/80 32.5%  12/13/23: Lower Extremity Functional Score: 56 / 80 = 70.0 %   COGNITION: Overall cognitive status: Within functional limits for tasks assessed  SENSATION: WFL  EDEMA:  Normal for this time s/p   PALPATION: General soreness  LOWER EXTREMITY ROM: very guarded  Active ROM Right eval Left eval Right 10/10/23 Right 10/15/23 Right  10/17/23 Right  10/22/23 Right 11/01/23 Right 11/05/23: Right 11/14/23 Right 11/27/23 Right 11/29/23 Right 12/10/23 Right 12/13/23 Right 12/25/2023 Right 01/01/24 Right 01/14/24  Hip flexion                  Hip extension                  Hip abduction                  Hip adduction                  Hip internal rotation                  Hip  external rotation                  Knee flexion 32  65 AAROM 74 AAROM 75 AAROM 72 90 90 AROM 90 degrees, AROM 98 degrees 110 113 110 AROM 115 PROM AROM 113 PROM 115  AROM 115 AROM 123 115 AROM 122 AAROM  Knee extension -15   -8 -10 -8 -5 -5  -3 5 from neutral 5 from neutral 5 from neautral 3 from 0 4 2 from 0  Ankle dorsiflexion Decreased versus left                 Ankle plantarflexion                  Ankle inversion                  Ankle eversion                   (Blank rows = not tested)  LOWER EXTREMITY MMT:  MMT Right eval Right 11/07/23 Right 12/13/23 Right 12/25/2023 Right  01/14/24  Hip flexion 2 4 4+ 4+   Hip extension   3+ 4   Hip abduction   4- 4   Hip adduction       Hip internal rotation       Hip external rotation       Knee flexion   4- 4- 4  Knee extension 2 (poor quad set) 3- 4- 4- 4  Ankle dorsiflexion       Ankle plantarflexion       Ankle inversion       Ankle eversion        (Blank rows = not tested)  FUNCTIONAL TESTS:  Evaluation 5 times sit to stand: 34.98 sec using hands to assist and reliant on left leg 2 minute walk test: 32 ft with RW 11/07/23  5X STS no UE (leaning to Lt) 12.38 (was 34.98 using UE)  125' with RW (was 32 ft) with SPC 40' in 2 minutes 11/29/23: 362 ft no AD 12/13/23: 386ft no AD  5X STS no UE  (equal weight bearing): 13.21; 8.64   GAIT: Distance walked: 32 ft Assistive device utilized: Walker - 2 wheeled Level of assistance: SBA Comments: slow pace; step to gait  TREATMENT DATE: 01/14/2024  Therapeutic Exercise: -Stationary bike, 5 minutes, seat 6, unable to power bike, level 3 resistance -Knee drive/extension stretch, 1 set 8 reps, 10 second holds, pt cued for increased OP with RUE on both stretches -Forward lunges onto bosu ball, 2 sets of 8 reps first set, 6 reps second set,  better performance going into LLE, pt cued for core activation and upright posture, pt cued for decreased UE support -Heel raises, 2 sets of 20 reps, no UE support second set -Step ups on bosu ball with hip flexion with opposite LE, 2 sets of 5 reps  01/09/24: - Bike seat 6 x 5' - [employer] TKE 3Pl Standing: - Stairs reciprocal 7in x 1RT (difficulty descending); 2RT 7in ascend, 4in descending - Step down training with 6in step height 15x - Hamstring stretch 12in step - Lateral step up 6in step height 15x - Forward lunges 15x BLE - SLS Lt 10, Rt 23 - Leg press 5Pl 3x 10  Manual:  -Decongestive    01/07/24: Manual:  -decongestive techniques for edema control Prone: -Contract/relax 5x 10/ 30 -Quad stretch 3x 30 Supine: AROM: 4-125degrees Standing: -Retro gait  2' @ speed.6  -Squats 15x front of chair -Reciprocal pattern 7in step height 3RT 1 HR -Leg press 2x 10 5Pl -Stationary bike, 5 minutes, seat 5,    PATIENT EDUCATION:  Education details: Patient educated on exam findings, POC, scope of PT, HEP, and what to expect next visit. Person educated: Patient Education method: Explanation, Demonstration, and Handouts Education comprehension: verbalized understanding, returned demonstration, verbal cues required, and tactile cues required  HOME EXERCISE PROGRAM: 12/13/23:   Begin walking program  12/04/23: - Heel prop with weight on thigh  11/12/23: - Prone Knee Extension Hang  - 2 x daily - 7 x weekly - 1 sets - 1 reps - 2'+ hold  11/05/23: - desensitization techniques on incision - TKE with ball against wall.  11/01/23 - Standing Hamstring Stretch with Step  - 2 x daily - 7 x weekly - 1 sets - 5 reps - 20 hold - knee bends on the step using rails to hold onto  - 2 x daily - 7 x weekly - 3 sets - 10 reps - 5 sec hold  Access Code: [email or web address] URL: [email or web address] Date: 10/07/2023 Prepared by: [facility] - Rehab  Exercises - Supine Ankle Pumps  - 2-3  x daily - 7 x weekly - 2 sets - 10-20 reps - Supine Heel Slide  - 2-3 x daily - 7 x weekly - 3 sets - 10-20 reps - 5 hold - Seated Knee Flexion  - 2-3 x daily - 7 x weekly - 3 sets - 10-20 reps - 10 hold - Seated Long Arc Quad  - 2-3 x daily - 7 x weekly - 3 sets - 10-20 reps - Supine Quad Set  - 2-3 x daily - 7 x weekly - 3 sets - 10 reps - Supine Heel Slide with Strap  - 2-3 x daily - 7 x weekly - 3 sets - 10 reps - Seated Knee Extension AAROM  - 2-3 x daily - 7 x weekly - 3 sets - 10 reps - Supine Knee Extension Stretch on Towel Roll  - 2-3 x daily - 7 x weekly - 3 sets - 10 reps  ASSESSMENT:  CLINICAL IMPRESSION: Patient continues to demonstrate improved LE strength, improved gait quality and balance. Patient also demonstrates improved endurance with aerobic based exercise during today's session. Patient able to  progress dynamic balance and core activation exercises today with increased resistance with stationary bike and step up variation with bosu ball, good performance with verbal cueing. Patient to be discharged to HEP at this date due to meeting all therapy goals and pt happy with current level of function.    Eval: Patient is a 68 y.o. female who was seen today for physical therapy evaluation and treatment for s/p Right TKA.  Patient demonstrates muscle weakness, reduced ROM, and fascial restrictions which are likely contributing to symptoms of pain and are negatively impacting patient ability to perform ADLs and functional mobility tasks. Patient will benefit from skilled physical therapy services to address these deficits to reduce pain and improve level of function with ADLs and functional mobility tasks.   OBJECTIVE IMPAIRMENTS: Abnormal gait, decreased activity tolerance, decreased mobility, difficulty walking, decreased ROM, decreased strength, increased fascial restrictions, and pain.   ACTIVITY LIMITATIONS: carrying, lifting, bending, sitting, standing, squatting, sleeping,  stairs, transfers, bed mobility, locomotion level, and caring for others  PARTICIPATION LIMITATIONS: meal prep, cleaning, laundry, driving, shopping, community activity, and yard work  [facility] POTENTIAL: Good  CLINICAL DECISION MAKING: Evolving/moderate complexity  EVALUATION COMPLEXITY: Moderate   GOALS: Goals reviewed with patient? Yes  SHORT TERM GOALS: Target date: 10/28/23 patient will be independent with initial HEP Baseline: Goal status: MET  2.  Patient will report 50% improvement overall Baseline: 12/13/23:  Reports improved by 75% Goal status: MET   3.  Patient will increase right knee flexion to 90 to prepare for step navigation Baseline: 32 Goal status: MET    LONG TERM GOALS: Target date: 11/22/23  Patient will be independent in self management strategies to improve quality of life and functional outcomes.  Baseline:  Goal status: MET  2.  Patient will report 75% improvement overall  Baseline: 12/13/23:  Reports improved by 75% Goal status: MET  3.  Patient will improve LEFS score by 10  points to demonstrate improved perceived function   Baseline: 8/80; 12/13/23: Lower Extremity Functional Score: 56 / 80 = 70.0 % Goal status: MET  4.   Patient will increase right leg MMT's to 5/5 to allow navigation of steps without gait deviation or loss of balance  Baseline: see above Goal status: MET   5.  Patient will increase knee mobility to -2 to 120 to promote normal navigation of steps; step over step pattern  Baseline: see above;  Goal status: MET   PLAN:  PT FREQUENCY: 2x/week  PT DURATION: 6 weeks  PLANNED INTERVENTIONS: 97164- PT Re-evaluation, 97110-Therapeutic exercises, 97530- Therapeutic activity, 97112- Neuromuscular re-education, 97535- Self Care, 02859- Manual therapy, [phone]- Gait training, [phone]- Orthotic Fit/training, [phone]- Canalith repositioning, [id number]- Aquatic Therapy, [phone]- Splinting, Patient/Family education, Balance training, Stair  training, Taping, Dry Needling, Joint mobilization, Joint manipulation, Spinal manipulation, Spinal mobilization, Scar mobilization, and DME instructions.   PLAN FOR NEXT SESSION: Discharged  Lang Ada, PT, DPT [facility] Office: [phone] 2:28 PM, 01/14/24

## 2024-01-18 DIAGNOSIS — L0390 Cellulitis, unspecified: Secondary | ICD-10-CM | POA: Diagnosis not present

## 2024-01-18 DIAGNOSIS — Z6827 Body mass index (BMI) 27.0-27.9, adult: Secondary | ICD-10-CM | POA: Diagnosis not present

## 2024-01-18 DIAGNOSIS — R609 Edema, unspecified: Secondary | ICD-10-CM | POA: Diagnosis not present

## 2024-02-12 DIAGNOSIS — M25661 Stiffness of right knee, not elsewhere classified: Secondary | ICD-10-CM | POA: Diagnosis not present

## 2024-03-05 ENCOUNTER — Inpatient Hospital Stay: Payer: PPO | Attending: Hematology and Oncology | Admitting: Hematology and Oncology

## 2024-03-05 VITALS — BP 132/59 | HR 67 | Temp 97.2°F | Resp 17 | Wt 159.4 lb

## 2024-03-05 DIAGNOSIS — Z79810 Long term (current) use of selective estrogen receptor modulators (SERMs): Secondary | ICD-10-CM | POA: Insufficient documentation

## 2024-03-05 DIAGNOSIS — N6092 Unspecified benign mammary dysplasia of left breast: Secondary | ICD-10-CM | POA: Insufficient documentation

## 2024-03-05 MED ORDER — TAMOXIFEN CITRATE 20 MG PO TABS: 20.0000 mg | ORAL_TABLET | Freq: Every day | ORAL | 3 refills | Status: AC

## 2024-03-05 NOTE — Progress Notes (Signed)
 Patient Care Team: Jolee Elsie RAMAN, PA as PCP - General (Physician Assistant)  DIAGNOSIS:  Encounter Diagnosis  Name Primary?   Atypical ductal hyperplasia of left breast Yes    CHIEF COMPLIANT: Follow-up on tamoxifen  therapy  HISTORY OF PRESENT ILLNESS:  History of Present Illness Amanda Roberts is a 68 year old female with breast cancer who presents for a follow-up visit.  Four months ago, she underwent a procedure resulting in persistent numbness and soreness. She is currently on tamoxifen  without significant hot flashes. Her last mammogram was on December 13, 2023, and she had an MRI last year. There have been challenges in scheduling imaging studies due to issues with the breast center's understanding of a new contrast method.     ALLERGIES:  is allergic to levaquin [levofloxacin in d5w] and sulfa antibiotics.  MEDICATIONS:  Current Outpatient Medications  Medication Sig Dispense Refill   albuterol (PROVENTIL HFA;VENTOLIN HFA) 108 (90 Base) MCG/ACT inhaler Inhale 2 puffs into the lungs as directed.     clobetasol cream (TEMOVATE) 0.05 % Apply topically as needed (redness).     cyclobenzaprine (FLEXERIL) 10 MG tablet Take 1 tablet by mouth daily as needed for muscle spasms.     furosemide (LASIX) 40 MG tablet Take 1 tablet by mouth daily as needed.     hydrochlorothiazide (HYDRODIURIL) 25 MG tablet Take 25 mg by mouth daily. 1/2 tablet a day     metoprolol succinate (TOPROL-XL) 50 MG 24 hr tablet Take 1 tablet by mouth daily. 1 1/4 Tabs a day     montelukast (SINGULAIR) 10 MG tablet Take 10 mg by mouth as directed.     Multiple Vitamin (MULTIVITAMIN) tablet Take 1 tablet by mouth daily.     nitrofurantoin, macrocrystal-monohydrate, (MACROBID) 100 MG capsule Take by mouth 2 (two) times daily as needed.     rosuvastatin (CRESTOR) 5 MG tablet Take 5 mg by mouth daily.     tamoxifen  (NOLVADEX ) 20 MG tablet Take 1 tablet (20 mg total) by mouth daily. 90 tablet 3   No current  facility-administered medications for this visit.    PHYSICAL EXAMINATION: ECOG PERFORMANCE STATUS: 1 - Symptomatic but completely ambulatory  Vitals:   03/05/24 0838  BP: (!) 132/59  Pulse: 67  Resp: 17  Temp: (!) 97.2 F (36.2 C)  SpO2: 100%   Filed Weights   03/05/24 0838  Weight: 159 lb 6.4 oz (72.3 kg)    Physical Exam No palpable lumps or nodules in bilateral breasts or axilla  (exam performed in the presence of a chaperone)  LABORATORY DATA:  I have reviewed the data as listed    Latest Ref Rng & Units 10/20/2019   11:30 AM  CMP  Glucose 70 - 99 mg/dL 897   BUN 8 - 23 mg/dL 12   Creatinine 9.55 - 1.00 mg/dL 9.23   Sodium 864 - 854 mmol/L 140   Potassium 3.5 - 5.1 mmol/L 4.2   Chloride 98 - 111 mmol/L 105   CO2 22 - 32 mmol/L 27   Calcium 8.9 - 10.3 mg/dL 9.2     No results found for: WBC, HGB, HCT, MCV, PLT, NEUTROABS  ASSESSMENT & PLAN:  Atypical ductal hyperplasia of left breast left lumpectomy on 10/23/19 for which pathology showed Intraductal papilloma with atypical ductal hyperplasia   Prognosis: Tyrer Cusick risk score: Lifetime risk 25%   Current treatment: Tamoxifen  20 mg daily started 11/27/2019 Tamoxifen  toxicities: Occasional hot flashes but otherwise tolerating it well. Tamoxifen   will be completed in June 2026  Breast cancer surveillance:   1.  Mammograms 12/13/2023: Benign breast density category B 2.   breast MRI 02/27/2023: Benign, breast density category B I recommended that we do contrast-enhanced mammograms for surveillance starting next year.   Patient tells me that she tried to schedule the contrast-enhanced mammograms but the breast center did not provide that appointment.   Return to clinic in 1 year for follow-up     No orders of the defined types were placed in this encounter.  The patient has a good understanding of the overall plan. she agrees with it. she will call with any problems that may develop before the next  visit here. Total time spent: 30 mins including face to face time and time spent for planning, charting and co-ordination of care   Naomi MARLA Chad, MD 03/05/24

## 2024-03-05 NOTE — Assessment & Plan Note (Addendum)
 left lumpectomy on 10/23/19 for which pathology showed Intraductal papilloma with atypical ductal hyperplasia   Prognosis: Tyrer Cusick risk score: Lifetime risk 25%   Current treatment: Tamoxifen  20 mg daily started 11/27/2019 Tamoxifen  toxicities: Occasional hot flashes but otherwise tolerating it well. Tamoxifen  will be completed in June 2026  Breast cancer surveillance:   1.  Mammograms 12/13/2023: Benign breast density category B 2.   breast MRI 02/27/2023: Benign, breast density category B I recommended that we do contrast-enhanced mammograms for surveillance starting next year.     Return to clinic in 1 year for follow-up

## 2024-04-14 DIAGNOSIS — I10 Essential (primary) hypertension: Secondary | ICD-10-CM | POA: Diagnosis not present

## 2024-04-14 DIAGNOSIS — R002 Palpitations: Secondary | ICD-10-CM | POA: Diagnosis not present

## 2024-04-14 DIAGNOSIS — Z6828 Body mass index (BMI) 28.0-28.9, adult: Secondary | ICD-10-CM | POA: Diagnosis not present

## 2024-04-14 DIAGNOSIS — L301 Dyshidrosis [pompholyx]: Secondary | ICD-10-CM | POA: Diagnosis not present

## 2024-04-14 DIAGNOSIS — Z853 Personal history of malignant neoplasm of breast: Secondary | ICD-10-CM | POA: Diagnosis not present

## 2024-04-14 DIAGNOSIS — Z23 Encounter for immunization: Secondary | ICD-10-CM | POA: Diagnosis not present

## 2024-05-22 NOTE — Progress Notes (Unsigned)
 Cardiology Office Note:  .   Date:  05/22/2024  ID:  Amanda Roberts, DOB 22-Mar-1956, MRN [id number] PCP: Amanda Elsie RAMAN, PA  [facility] Providers Cardiologist:  None {   History of Present Illness: .   Amanda Roberts is a 68 y.o. female  with PMHx of asthma, HTN, family history of CAD, breast cancer who reports to [place] office for follow up.   Relevant cardiac studies Lexiscan  2018: Normal, low risk study  Last seen in heartcare 09/02/2023 for new patient visit related to preop for right knee replacement.  Denies any cardiac complaints. EKG showed NSR with nonspecific ST and T wave abnormalities. Continued on furosemide 40 mg as needed, HCTZ 25 mg daily, Toprol XL 80 mg daily, Crestor 5 mg daily.  Follow-up echo 09/25/2023 showed EF 60 to 65%, normal L/R function and structure, and no significant valvular abnormalities.  Today, reports ### and denies ###.  Denies chest pain, shortness of breath, palpitations, syncope, presyncope, dizziness, orthopnea, PND, swelling or significant weight changes, acute bleeding, or claudication.   Reports compliance with medications.  Dietary habitats:  Activity level:  Social: Denies tobacco use/alcohol /drug use  Denies any recent hospitalizations or visits to the emergency department.    HTN Reports well controlled Home BP:  BP this OV well controlled today:  Continue on furosemide 40 mg as needed, HCTZ 25 mg daily, Toprol XL 80 mg daily, Encourage physical activity for 150 minutes per week and heart healthy low sodium diet. Discussed limiting sodium intake to < 2 grams daily.    Family history of heart disease Continue Crestor 5 mg daily   ROS: 10 point review of system has been reviewed and considered negative except ones been listed in the HPI.   Studies Reviewed: .   Lexiscan  2018 The left ventricular ejection fraction is hyperdynamic (>65%). Nuclear stress EF: 69%. The study is normal. This is a low risk study.  ECHO  09/2023 IMPRESSIONS   1. Left ventricular ejection fraction, by estimation, is 60 to 65%. Left ventricular ejection fraction by 3D volume is 61 %. The left ventricle has normal function. The left ventricle has no regional wall motion abnormalities. Left ventricular diastolic  parameters were normal.   2. Right ventricular systolic function is normal. The right ventricular size is normal. There is normal pulmonary artery systolic pressure. The estimated right ventricular systolic pressure is 19.6 mmHg.   3. The mitral valve is normal in structure. No evidence of mitral valve regurgitation. No evidence of mitral stenosis.   4. The aortic valve is tricuspid. Aortic valve regurgitation is not  visualized. No aortic stenosis is present.   5. The inferior vena cava is normal in size with greater than 50% respiratory variability, suggesting right atrial pressure of 3 mmHg.  Risk Assessment/Calculations:   {Does this patient have ATRIAL FIBRILLATION?:[id number]} No BP recorded.  {Refresh Note OR Click here to enter BP  :1}***       Physical Exam:   VS:  There were no vitals taken for this visit.   Wt Readings from Last 3 Encounters:  03/05/24 159 lb 6.4 oz (72.3 kg)  09/02/23 161 lb (73 kg)  03/06/23 161 lb 12.8 oz (73.4 kg)    GEN: Well nourished, well developed in no acute distress while sitting in chair.  NECK: No JVD; No carotid bruits CARDIAC: ***RRR, no murmurs, rubs, gallops RESPIRATORY:  Clear to auscultation without rales, wheezing or rhonchi  ABDOMEN: Soft, non-tender, non-distended EXTREMITIES:  No  edema; No deformity   ASSESSMENT AND PLAN: .   ***    {Are you ordering a CV Procedure (e.g. stress test, cath, DCCV, TEE, etc)?   Press F2        :[id number]}  Dispo: ***  Signed, Amanda CINDERELLA Kapur, PA-C

## 2024-05-27 ENCOUNTER — Ambulatory Visit: Payer: Self-pay | Admitting: Physician Assistant

## 2024-05-27 ENCOUNTER — Telehealth: Payer: Self-pay | Admitting: Physician Assistant

## 2024-05-27 ENCOUNTER — Ambulatory Visit: Attending: Physician Assistant | Admitting: Physician Assistant

## 2024-05-27 ENCOUNTER — Encounter: Payer: Self-pay | Admitting: Physician Assistant

## 2024-05-27 ENCOUNTER — Other Ambulatory Visit (HOSPITAL_COMMUNITY)
Admission: RE | Admit: 2024-05-27 | Discharge: 2024-05-27 | Disposition: A | Discharge status: Home / Self Care | Source: Ambulatory Visit | Attending: Physician Assistant | Admitting: Physician Assistant

## 2024-05-27 VITALS — BP 119/74 | HR 64 | Ht 63.0 in | Wt 161.0 lb

## 2024-05-27 DIAGNOSIS — Z8249 Family history of ischemic heart disease and other diseases of the circulatory system: Secondary | ICD-10-CM

## 2024-05-27 DIAGNOSIS — E785 Hyperlipidemia, unspecified: Secondary | ICD-10-CM | POA: Diagnosis not present

## 2024-05-27 DIAGNOSIS — I10 Essential (primary) hypertension: Secondary | ICD-10-CM | POA: Diagnosis present

## 2024-05-27 DIAGNOSIS — R002 Palpitations: Secondary | ICD-10-CM | POA: Diagnosis not present

## 2024-05-27 DIAGNOSIS — R0609 Other forms of dyspnea: Secondary | ICD-10-CM

## 2024-05-27 DIAGNOSIS — R0602 Shortness of breath: Secondary | ICD-10-CM | POA: Diagnosis not present

## 2024-05-27 DIAGNOSIS — R609 Edema, unspecified: Secondary | ICD-10-CM | POA: Diagnosis not present

## 2024-05-27 LAB — TSH: TSH: 1.78 u[IU]/mL (ref 0.350–4.500)

## 2024-05-27 LAB — COMPREHENSIVE METABOLIC PANEL WITH GFR
ALT: 16 U/L (ref 0–44)
AST: 22 U/L (ref 15–41)
Albumin: 4.3 g/dL (ref 3.5–5.0)
Alkaline Phosphatase: 66 U/L (ref 38–126)
Anion gap: 12 (ref 5–15)
BUN: 13 mg/dL (ref 8–23)
CO2: 25 mmol/L (ref 22–32)
Calcium: 9.5 mg/dL (ref 8.9–10.3)
Chloride: 102 mmol/L (ref 98–111)
Creatinine, Ser: 0.82 mg/dL (ref 0.44–1.00)
GFR, Estimated: >60 mL/min (ref >60)
Glucose, Bld: 90 mg/dL (ref 70–99)
Potassium: 3.9 mmol/L (ref 3.5–5.1)
Sodium: 139 mmol/L (ref 135–145)
Total Bilirubin: 0.3 mg/dL (ref 0.0–1.2)
Total Protein: 6.9 g/dL (ref 6.5–8.1)

## 2024-05-27 LAB — CBC
HCT: 41 % (ref 36.0–46.0)
Hemoglobin: 14 g/dL (ref 12.0–15.0)
MCH: 31.2 pg (ref 26.0–34.0)
MCHC: 34.1 g/dL (ref 30.0–36.0)
MCV: 91.3 fL (ref 80.0–100.0)
Platelets: 258 K/uL (ref 150–400)
RBC: 4.49 MIL/uL (ref 3.87–5.11)
RDW: 11.6 % (ref 11.5–15.5)
WBC: 5.4 K/uL (ref 4.0–10.5)
nRBC: 0 % (ref 0.0–0.2)

## 2024-05-27 LAB — MAGNESIUM: Magnesium: 2.3 mg/dL (ref 1.7–2.4)

## 2024-05-27 NOTE — Telephone Encounter (Signed)
 Attempted to call patient but no answer and left voicemail: Would like to discuss considering ischemic evaluation. New DOE could be an angina equivalent especially in women. Recommend cardiac CTA. Please continue to follow up.

## 2024-05-27 NOTE — Patient Instructions (Signed)
 Medication Instructions:   Your physician recommends that you continue on your current medications as directed. Please refer to the Current Medication list given to you today.   Labwork: cbc,magnesium,cmet,tsh today  Testing/Procedures: None today  Follow-Up: 6 months with S.Dunlap,PA-C  Any Other Special Instructions Will Be Listed Below (If Applicable).  If you need a refill on your cardiac medications before your next appointment, please call your pharmacy.

## 2024-05-29 NOTE — Addendum Note (Signed)
 Addended by: KENETH ROSINA BROCKS on: 05/29/2024 08:48 AM   Modules accepted: Orders

## 2024-05-29 NOTE — Telephone Encounter (Signed)
 Patient notified and verbalized understanding of testing. Patient agreeable.   Please verify Lopressor dosage.

## 2024-05-29 NOTE — Telephone Encounter (Signed)
 Pt received message and said to schedule test and to call her with the appt.

## 2024-06-01 MED ORDER — METOPROLOL TARTRATE 50 MG PO TABS: ORAL_TABLET | ORAL | 0 refills | Status: AC

## 2024-06-01 NOTE — Addendum Note (Signed)
 Addended by: KENETH KNEE C on: 06/01/2024 08:02 AM   Modules accepted: Orders

## 2024-06-01 NOTE — Telephone Encounter (Signed)
 Medication sent to pharmacy. MyChart message sent to pt.

## 2024-06-08 ENCOUNTER — Telehealth (HOSPITAL_COMMUNITY): Payer: Self-pay | Admitting: Emergency Medicine

## 2024-06-08 ENCOUNTER — Other Ambulatory Visit (HOSPITAL_COMMUNITY): Payer: Self-pay | Admitting: Emergency Medicine

## 2024-06-08 NOTE — Telephone Encounter (Signed)
 Reaching out to patient to offer assistance regarding upcoming cardiac imaging study; pt verbalizes understanding of appt date/time, parking situation and where to check in, pre-test NPO status and medications ordered, and verified current allergies; name and call back number provided for further questions should they arise Rockwell Alexandria RN Navigator Cardiac Imaging [facility] [phone] office [phone] cell

## 2024-06-09 ENCOUNTER — Ambulatory Visit (HOSPITAL_COMMUNITY)
Admission: RE | Admit: 2024-06-09 | Discharge: 2024-06-09 | Disposition: A | Discharge status: Home / Self Care | Source: Ambulatory Visit | Attending: Internal Medicine | Admitting: Internal Medicine

## 2024-06-09 DIAGNOSIS — I700 Atherosclerosis of aorta: Secondary | ICD-10-CM | POA: Diagnosis not present

## 2024-06-09 DIAGNOSIS — R0609 Other forms of dyspnea: Secondary | ICD-10-CM | POA: Insufficient documentation

## 2024-06-09 MED ORDER — NITROGLYCERIN 0.4 MG SL SUBL
0.8000 mg | SUBLINGUAL_TABLET | Freq: Once | SUBLINGUAL | Status: AC
  Administered 2024-06-09: 0.8 mg via SUBLINGUAL

## 2024-06-09 MED ORDER — IOHEXOL 350 MG/ML SOLN
100.0000 mL | Freq: Once | INTRAVENOUS | Status: AC | PRN
  Administered 2024-06-09: 100 mL via INTRAVENOUS

## 2024-06-10 ENCOUNTER — Ambulatory Visit: Payer: Self-pay | Admitting: Physician Assistant

## 2024-08-25 ENCOUNTER — Ambulatory Visit: Admitting: Gastroenterology

## 2025-03-04 ENCOUNTER — Ambulatory Visit: Admitting: Hematology and Oncology
